# Patient Record
Sex: Female | Born: 1960
Health system: Southern US, Community
[De-identification: ages and names within clinical notes are randomized; demographics above are authoritative.]

## PROBLEM LIST (undated history)

## (undated) DIAGNOSIS — M47812 Spondylosis without myelopathy or radiculopathy, cervical region: Secondary | ICD-10-CM

## (undated) DIAGNOSIS — IMO0002 Reserved for concepts with insufficient information to code with codable children: Secondary | ICD-10-CM

## (undated) DIAGNOSIS — I10 Essential (primary) hypertension: Secondary | ICD-10-CM

## (undated) DIAGNOSIS — M5126 Other intervertebral disc displacement, lumbar region: Secondary | ICD-10-CM

## (undated) DIAGNOSIS — R87619 Unspecified abnormal cytological findings in specimens from cervix uteri: Secondary | ICD-10-CM

## (undated) DIAGNOSIS — E785 Hyperlipidemia, unspecified: Secondary | ICD-10-CM

## (undated) DIAGNOSIS — M47816 Spondylosis without myelopathy or radiculopathy, lumbar region: Secondary | ICD-10-CM

## (undated) DIAGNOSIS — C801 Malignant (primary) neoplasm, unspecified: Secondary | ICD-10-CM

## (undated) HISTORY — PX: REDUCTION MAMMAPLASTY: SUR839

## (undated) HISTORY — DX: Reserved for concepts with insufficient information to code with codable children: IMO0002

## (undated) HISTORY — DX: Hyperlipidemia, unspecified: E78.5

## (undated) HISTORY — DX: Malignant (primary) neoplasm, unspecified: C80.1

## (undated) HISTORY — DX: Unspecified abnormal cytological findings in specimens from cervix uteri: R87.619

## (undated) HISTORY — DX: Spondylosis without myelopathy or radiculopathy, lumbar region: M47.816

## (undated) HISTORY — PX: REPLACEMENT TOTAL KNEE: SUR1224

## (undated) HISTORY — DX: Other intervertebral disc displacement, lumbar region: M51.26

## (undated) HISTORY — DX: Essential (primary) hypertension: I10

## (undated) HISTORY — PX: OTHER SURGICAL HISTORY: SHX169

## (undated) HISTORY — DX: Spondylosis without myelopathy or radiculopathy, cervical region: M47.812

---

## 2005-02-20 ENCOUNTER — Ambulatory Visit: Payer: Self-pay | Admitting: Family Medicine

## 2005-02-20 ENCOUNTER — Other Ambulatory Visit: Admission: RE | Admit: 2005-02-20 | Discharge: 2005-02-20 | Payer: Self-pay | Admitting: Family Medicine

## 2005-02-20 LAB — CONVERTED CEMR LAB: Pap Smear: NORMAL

## 2005-02-25 ENCOUNTER — Encounter: Payer: Self-pay | Admitting: Family Medicine

## 2005-02-25 LAB — CONVERTED CEMR LAB
ALT: 20 units/L
AST: 53 units/L
Creatinine, Ser: 0.6 mg/dL

## 2005-02-28 ENCOUNTER — Ambulatory Visit: Payer: Self-pay | Admitting: Family Medicine

## 2005-02-28 LAB — CONVERTED CEMR LAB
Microalb Creat Ratio: <30 mg/g
Microalb, Ur: NORMAL mg/dL
Microalbumin U total vol: NORMAL mg/L

## 2005-04-10 ENCOUNTER — Ambulatory Visit: Payer: Self-pay | Admitting: Family Medicine

## 2005-04-24 ENCOUNTER — Ambulatory Visit: Payer: Self-pay | Admitting: Family Medicine

## 2005-07-10 ENCOUNTER — Ambulatory Visit: Payer: Self-pay | Admitting: Family Medicine

## 2005-07-10 LAB — CONVERTED CEMR LAB: Hgb A1c MFr Bld: 6.4 %

## 2005-10-21 DIAGNOSIS — E669 Obesity, unspecified: Secondary | ICD-10-CM | POA: Insufficient documentation

## 2005-10-21 DIAGNOSIS — E785 Hyperlipidemia, unspecified: Secondary | ICD-10-CM | POA: Insufficient documentation

## 2005-10-21 DIAGNOSIS — I10 Essential (primary) hypertension: Secondary | ICD-10-CM | POA: Insufficient documentation

## 2005-10-21 DIAGNOSIS — E119 Type 2 diabetes mellitus without complications: Secondary | ICD-10-CM | POA: Insufficient documentation

## 2005-10-21 DIAGNOSIS — N920 Excessive and frequent menstruation with regular cycle: Secondary | ICD-10-CM | POA: Insufficient documentation

## 2005-11-12 ENCOUNTER — Encounter: Payer: Self-pay | Admitting: Family Medicine

## 2005-11-13 ENCOUNTER — Ambulatory Visit: Payer: Self-pay | Admitting: Family Medicine

## 2005-11-13 LAB — CONVERTED CEMR LAB: Hgb A1c MFr Bld: 6.2 %

## 2006-03-30 ENCOUNTER — Ambulatory Visit: Payer: Self-pay | Admitting: Family Medicine

## 2006-03-30 DIAGNOSIS — M479 Spondylosis, unspecified: Secondary | ICD-10-CM | POA: Insufficient documentation

## 2006-04-06 ENCOUNTER — Encounter: Payer: Self-pay | Admitting: Family Medicine

## 2006-04-06 LAB — CONVERTED CEMR LAB
ALT: 9 units/L (ref 0–35)
AST: 14 units/L (ref 0–37)
Albumin: 4.1 g/dL (ref 3.5–5.2)
Alkaline Phosphatase: 60 units/L (ref 39–117)
BUN: 14 mg/dL (ref 6–23)
CO2: 24 meq/L (ref 19–32)
Calcium: 9.1 mg/dL (ref 8.4–10.5)
Chloride: 102 meq/L (ref 96–112)
Cholesterol: 140 mg/dL (ref 0–200)
Creatinine, Ser: 0.71 mg/dL (ref 0.40–1.20)
Glucose, Bld: 131 mg/dL — ABNORMAL HIGH (ref 70–99)
HDL: 58 mg/dL (ref >39)
LDL Cholesterol: 72 mg/dL (ref 0–99)
Potassium: 4.5 meq/L (ref 3.5–5.3)
Sodium: 137 meq/L (ref 135–145)
Total Bilirubin: 0.6 mg/dL (ref 0.3–1.2)
Total CHOL/HDL Ratio: 2.4
Total Protein: 7.5 g/dL (ref 6.0–8.3)
Triglycerides: 49 mg/dL (ref <150)
VLDL: 10 mg/dL (ref 0–40)

## 2006-04-07 ENCOUNTER — Encounter: Payer: Self-pay | Admitting: Family Medicine

## 2006-07-06 ENCOUNTER — Ambulatory Visit: Payer: Self-pay | Admitting: Family Medicine

## 2006-07-06 LAB — CONVERTED CEMR LAB: Hgb A1c MFr Bld: 6.9 %

## 2006-11-02 ENCOUNTER — Ambulatory Visit: Payer: Self-pay | Admitting: Family Medicine

## 2006-11-02 LAB — CONVERTED CEMR LAB: Rapid Strep: NEGATIVE

## 2006-11-24 ENCOUNTER — Ambulatory Visit: Payer: Self-pay | Admitting: Family Medicine

## 2006-11-24 ENCOUNTER — Other Ambulatory Visit: Admission: RE | Admit: 2006-11-24 | Discharge: 2006-11-24 | Payer: Self-pay | Admitting: Family Medicine

## 2006-11-24 ENCOUNTER — Encounter: Payer: Self-pay | Admitting: Family Medicine

## 2006-11-24 LAB — CONVERTED CEMR LAB: Hgb A1c MFr Bld: 6.6 %

## 2006-11-26 ENCOUNTER — Telehealth (INDEPENDENT_AMBULATORY_CARE_PROVIDER_SITE_OTHER): Payer: Self-pay | Admitting: *Deleted

## 2006-11-27 LAB — CONVERTED CEMR LAB: Pap Smear: NORMAL

## 2007-01-13 ENCOUNTER — Encounter: Payer: Self-pay | Admitting: Family Medicine

## 2007-03-23 ENCOUNTER — Encounter: Payer: Self-pay | Admitting: Family Medicine

## 2007-03-24 ENCOUNTER — Ambulatory Visit: Payer: Self-pay | Admitting: Family Medicine

## 2007-03-24 LAB — CONVERTED CEMR LAB
Bilirubin Urine: NEGATIVE
Blood in Urine, dipstick: NEGATIVE
Glucose, Urine, Semiquant: NEGATIVE
Hgb A1c MFr Bld: 7 %
Ketones, urine, test strip: NEGATIVE
Nitrite: NEGATIVE
Protein, U semiquant: NEGATIVE
Specific Gravity, Urine: 1.02
Urobilinogen, UA: NEGATIVE
WBC Urine, dipstick: NEGATIVE
pH: 7

## 2007-06-01 ENCOUNTER — Encounter: Payer: Self-pay | Admitting: Family Medicine

## 2007-06-01 LAB — CONVERTED CEMR LAB
ALT: 10 units/L (ref 0–35)
AST: 18 units/L (ref 0–37)
Albumin: 3.9 g/dL (ref 3.5–5.2)
Alkaline Phosphatase: 55 units/L (ref 39–117)
BUN: 12 mg/dL (ref 6–23)
CO2: 25 meq/L (ref 19–32)
Calcium: 8.9 mg/dL (ref 8.4–10.5)
Chloride: 103 meq/L (ref 96–112)
Cholesterol: 131 mg/dL (ref 0–200)
Creatinine, Ser: 0.63 mg/dL (ref 0.40–1.20)
Glucose, Bld: 109 mg/dL — ABNORMAL HIGH (ref 70–99)
HDL: 57 mg/dL (ref >39)
LDL Cholesterol: 63 mg/dL (ref 0–99)
Potassium: 4.6 meq/L (ref 3.5–5.3)
Sodium: 139 meq/L (ref 135–145)
Total Bilirubin: 0.5 mg/dL (ref 0.3–1.2)
Total CHOL/HDL Ratio: 2.3
Total Protein: 7.1 g/dL (ref 6.0–8.3)
Triglycerides: 55 mg/dL (ref <150)
VLDL: 11 mg/dL (ref 0–40)

## 2007-06-02 ENCOUNTER — Encounter: Payer: Self-pay | Admitting: Family Medicine

## 2007-08-10 ENCOUNTER — Ambulatory Visit: Payer: Self-pay | Admitting: Family Medicine

## 2007-08-10 DIAGNOSIS — G5603 Carpal tunnel syndrome, bilateral upper limbs: Secondary | ICD-10-CM | POA: Insufficient documentation

## 2007-08-10 DIAGNOSIS — G5600 Carpal tunnel syndrome, unspecified upper limb: Secondary | ICD-10-CM

## 2007-08-10 LAB — CONVERTED CEMR LAB: Hgb A1c MFr Bld: 6.4 %

## 2007-10-27 ENCOUNTER — Telehealth (INDEPENDENT_AMBULATORY_CARE_PROVIDER_SITE_OTHER): Payer: Self-pay | Admitting: *Deleted

## 2007-10-28 ENCOUNTER — Ambulatory Visit: Payer: Self-pay | Admitting: Family Medicine

## 2007-12-15 ENCOUNTER — Ambulatory Visit: Payer: Self-pay | Admitting: Family Medicine

## 2007-12-15 DIAGNOSIS — M7541 Impingement syndrome of right shoulder: Secondary | ICD-10-CM | POA: Insufficient documentation

## 2007-12-15 DIAGNOSIS — M25519 Pain in unspecified shoulder: Secondary | ICD-10-CM | POA: Insufficient documentation

## 2007-12-15 DIAGNOSIS — M25511 Pain in right shoulder: Secondary | ICD-10-CM | POA: Insufficient documentation

## 2007-12-15 LAB — CONVERTED CEMR LAB: Hgb A1c MFr Bld: 6.4 %

## 2007-12-17 ENCOUNTER — Telehealth (INDEPENDENT_AMBULATORY_CARE_PROVIDER_SITE_OTHER): Payer: Self-pay | Admitting: *Deleted

## 2007-12-20 DIAGNOSIS — M79609 Pain in unspecified limb: Secondary | ICD-10-CM | POA: Insufficient documentation

## 2007-12-28 ENCOUNTER — Encounter: Payer: Self-pay | Admitting: Family Medicine

## 2007-12-30 ENCOUNTER — Encounter: Payer: Self-pay | Admitting: Family Medicine

## 2008-03-10 ENCOUNTER — Encounter: Payer: Self-pay | Admitting: Family Medicine

## 2008-03-15 ENCOUNTER — Encounter: Payer: Self-pay | Admitting: Family Medicine

## 2008-04-07 LAB — HM DIABETES EYE EXAM

## 2008-04-13 ENCOUNTER — Ambulatory Visit: Payer: Self-pay | Admitting: Family Medicine

## 2008-04-13 LAB — CONVERTED CEMR LAB: Hgb A1c MFr Bld: 6.5 %

## 2008-04-17 ENCOUNTER — Encounter: Payer: Self-pay | Admitting: Family Medicine

## 2008-09-13 ENCOUNTER — Ambulatory Visit: Payer: Self-pay | Admitting: Family Medicine

## 2008-09-13 LAB — CONVERTED CEMR LAB
Albumin/Creatinine Ratio, Urine, POC: <30
Creatinine,U: 50 mg/dL
Hgb A1c MFr Bld: 7 %
Microalbumin U total vol: 10 mg/L

## 2008-09-13 LAB — HM DIABETES FOOT EXAM: HM Diabetic Foot Exam: NORMAL

## 2008-09-14 ENCOUNTER — Encounter: Payer: Self-pay | Admitting: Family Medicine

## 2008-09-15 LAB — CONVERTED CEMR LAB
ALT: 10 units/L (ref 0–35)
AST: 14 units/L (ref 0–37)
Albumin: 3.6 g/dL (ref 3.5–5.2)
Alkaline Phosphatase: 55 units/L (ref 39–117)
BUN: 15 mg/dL (ref 6–23)
CO2: 24 meq/L (ref 19–32)
Calcium: 8.7 mg/dL (ref 8.4–10.5)
Chloride: 105 meq/L (ref 96–112)
Cholesterol: 151 mg/dL (ref 0–200)
Creatinine, Ser: 0.67 mg/dL (ref 0.40–1.20)
Glucose, Bld: 112 mg/dL — ABNORMAL HIGH (ref 70–99)
HDL: 55 mg/dL (ref >39)
LDL Cholesterol: 84 mg/dL (ref 0–99)
Potassium: 4.3 meq/L (ref 3.5–5.3)
Sodium: 139 meq/L (ref 135–145)
TSH: 4.26 microintl units/mL (ref 0.350–4.500)
Total Bilirubin: 0.5 mg/dL (ref 0.3–1.2)
Total CHOL/HDL Ratio: 2.7
Total Protein: 6.8 g/dL (ref 6.0–8.3)
Triglycerides: 62 mg/dL (ref <150)
VLDL: 12 mg/dL (ref 0–40)

## 2008-09-19 ENCOUNTER — Ambulatory Visit: Payer: Self-pay | Admitting: Family Medicine

## 2008-09-19 ENCOUNTER — Ambulatory Visit: Payer: Self-pay | Admitting: Diagnostic Radiology

## 2008-09-19 ENCOUNTER — Telehealth (INDEPENDENT_AMBULATORY_CARE_PROVIDER_SITE_OTHER): Payer: Self-pay | Admitting: *Deleted

## 2008-09-19 ENCOUNTER — Ambulatory Visit (HOSPITAL_BASED_OUTPATIENT_CLINIC_OR_DEPARTMENT_OTHER): Admission: RE | Admit: 2008-09-19 | Discharge: 2008-09-19 | Payer: Self-pay | Admitting: Family Medicine

## 2008-09-19 DIAGNOSIS — I951 Orthostatic hypotension: Secondary | ICD-10-CM | POA: Insufficient documentation

## 2008-09-19 LAB — CONVERTED CEMR LAB
Bilirubin Urine: NEGATIVE
Glucose, Urine, Semiquant: NEGATIVE
Ketones, urine, test strip: NEGATIVE
Nitrite: NEGATIVE
Specific Gravity, Urine: 1.01
Urobilinogen, UA: 0.2
WBC Urine, dipstick: NEGATIVE
pH: 7

## 2008-09-20 ENCOUNTER — Telehealth: Payer: Self-pay | Admitting: Family Medicine

## 2008-09-21 ENCOUNTER — Encounter: Payer: Self-pay | Admitting: Family Medicine

## 2008-09-21 DIAGNOSIS — D649 Anemia, unspecified: Secondary | ICD-10-CM | POA: Insufficient documentation

## 2008-09-21 LAB — CONVERTED CEMR LAB
ALT: 9 units/L (ref 0–35)
AST: 14 units/L (ref 0–37)
Albumin: 3.5 g/dL (ref 3.5–5.2)
Alkaline Phosphatase: 49 units/L (ref 39–117)
BUN: 18 mg/dL (ref 6–23)
Basophils Absolute: 0 10*3/uL (ref 0.0–0.1)
Basophils Relative: 1 % (ref 0–1)
CO2: 24 meq/L (ref 19–32)
Calcium: 8.3 mg/dL — ABNORMAL LOW (ref 8.4–10.5)
Chloride: 105 meq/L (ref 96–112)
Creatinine, Ser: 0.58 mg/dL (ref 0.40–1.20)
Eosinophils Absolute: 0.3 10*3/uL (ref 0.0–0.7)
Eosinophils Relative: 5 % (ref 0–5)
Glucose, Bld: 120 mg/dL — ABNORMAL HIGH (ref 70–99)
HCT: 32.8 % — ABNORMAL LOW (ref 36.0–46.0)
Hemoglobin: 10.1 g/dL — ABNORMAL LOW (ref 12.0–15.0)
Lymphocytes Relative: 37 % (ref 12–46)
Lymphs Abs: 2 10*3/uL (ref 0.7–4.0)
MCHC: 30.8 g/dL (ref 30.0–36.0)
MCV: 69.3 fL — ABNORMAL LOW (ref 78.0–100.0)
Monocytes Absolute: 0.5 10*3/uL (ref 0.1–1.0)
Monocytes Relative: 10 % (ref 3–12)
Neutro Abs: 2.6 10*3/uL (ref 1.7–7.7)
Neutrophils Relative %: 48 % (ref 43–77)
Platelets: 299 10*3/uL (ref 150–400)
Potassium: 4.5 meq/L (ref 3.5–5.3)
RBC: 4.73 M/uL (ref 3.87–5.11)
RDW: 15.8 % — ABNORMAL HIGH (ref 11.5–15.5)
Sodium: 141 meq/L (ref 135–145)
TSH: 4.013 microintl units/mL (ref 0.350–4.500)
Total Bilirubin: 0.4 mg/dL (ref 0.3–1.2)
Total Protein: 6.4 g/dL (ref 6.0–8.3)
WBC: 5.5 10*3/uL (ref 4.0–10.5)

## 2008-09-22 ENCOUNTER — Encounter: Admission: RE | Admit: 2008-09-22 | Discharge: 2008-09-22 | Payer: Self-pay | Admitting: Family Medicine

## 2008-09-22 DIAGNOSIS — R519 Headache, unspecified: Secondary | ICD-10-CM | POA: Insufficient documentation

## 2008-09-22 DIAGNOSIS — R51 Headache: Secondary | ICD-10-CM | POA: Insufficient documentation

## 2008-09-25 ENCOUNTER — Encounter: Admission: RE | Admit: 2008-09-25 | Discharge: 2008-09-25 | Payer: Self-pay | Admitting: Family Medicine

## 2008-09-28 ENCOUNTER — Telehealth: Payer: Self-pay | Admitting: Family Medicine

## 2008-10-07 ENCOUNTER — Encounter: Payer: Self-pay | Admitting: Family Medicine

## 2008-10-09 ENCOUNTER — Ambulatory Visit: Payer: Self-pay | Admitting: Family Medicine

## 2008-10-13 ENCOUNTER — Encounter: Payer: Self-pay | Admitting: Family Medicine

## 2008-10-16 ENCOUNTER — Encounter: Admission: RE | Admit: 2008-10-16 | Discharge: 2008-10-16 | Payer: Self-pay | Admitting: Specialist

## 2008-10-24 ENCOUNTER — Encounter: Payer: Self-pay | Admitting: Family Medicine

## 2008-10-25 ENCOUNTER — Encounter: Payer: Self-pay | Admitting: Family Medicine

## 2008-10-25 DIAGNOSIS — Z8679 Personal history of other diseases of the circulatory system: Secondary | ICD-10-CM | POA: Insufficient documentation

## 2008-10-26 ENCOUNTER — Encounter: Payer: Self-pay | Admitting: Family Medicine

## 2008-10-27 ENCOUNTER — Telehealth: Payer: Self-pay | Admitting: Family Medicine

## 2008-11-01 ENCOUNTER — Telehealth (INDEPENDENT_AMBULATORY_CARE_PROVIDER_SITE_OTHER): Payer: Self-pay | Admitting: *Deleted

## 2008-11-03 ENCOUNTER — Encounter: Admission: RE | Admit: 2008-11-03 | Discharge: 2008-11-03 | Payer: Self-pay | Admitting: Family Medicine

## 2008-11-09 ENCOUNTER — Encounter: Payer: Self-pay | Admitting: Family Medicine

## 2008-11-13 ENCOUNTER — Telehealth (INDEPENDENT_AMBULATORY_CARE_PROVIDER_SITE_OTHER): Payer: Self-pay | Admitting: *Deleted

## 2008-11-28 ENCOUNTER — Ambulatory Visit: Payer: Self-pay | Admitting: Family Medicine

## 2008-12-05 ENCOUNTER — Encounter: Payer: Self-pay | Admitting: Family Medicine

## 2008-12-14 ENCOUNTER — Encounter: Payer: Self-pay | Admitting: Family Medicine

## 2008-12-15 DIAGNOSIS — C73 Malignant neoplasm of thyroid gland: Secondary | ICD-10-CM | POA: Insufficient documentation

## 2008-12-21 ENCOUNTER — Encounter: Payer: Self-pay | Admitting: Family Medicine

## 2009-01-03 ENCOUNTER — Ambulatory Visit: Payer: Self-pay | Admitting: Family Medicine

## 2009-01-03 LAB — CONVERTED CEMR LAB: Rapid Strep: POSITIVE

## 2009-02-06 ENCOUNTER — Encounter: Payer: Self-pay | Admitting: Family Medicine

## 2009-02-13 DIAGNOSIS — C801 Malignant (primary) neoplasm, unspecified: Secondary | ICD-10-CM

## 2009-02-13 HISTORY — DX: Malignant (primary) neoplasm, unspecified: C80.1

## 2009-02-13 HISTORY — PX: TOTAL THYROIDECTOMY: SHX2547

## 2009-04-03 ENCOUNTER — Encounter: Payer: Self-pay | Admitting: Family Medicine

## 2009-04-27 ENCOUNTER — Ambulatory Visit: Payer: Self-pay | Admitting: Family Medicine

## 2009-04-27 LAB — CONVERTED CEMR LAB: Hgb A1c MFr Bld: 7.2 %

## 2009-10-04 ENCOUNTER — Encounter: Payer: Self-pay | Admitting: Family Medicine

## 2009-11-13 ENCOUNTER — Encounter: Payer: Self-pay | Admitting: Family Medicine

## 2010-01-13 HISTORY — PX: OTHER SURGICAL HISTORY: SHX169

## 2010-01-17 ENCOUNTER — Encounter: Payer: Self-pay | Admitting: Family Medicine

## 2010-01-22 ENCOUNTER — Other Ambulatory Visit
Admission: RE | Admit: 2010-01-22 | Discharge: 2010-01-22 | Payer: Self-pay | Source: Home / Self Care | Admitting: Family Medicine

## 2010-01-22 ENCOUNTER — Ambulatory Visit
Admission: RE | Admit: 2010-01-22 | Discharge: 2010-01-22 | Payer: Self-pay | Source: Home / Self Care | Attending: Family Medicine | Admitting: Family Medicine

## 2010-01-22 LAB — CONVERTED CEMR LAB: Pap Smear: NORMAL

## 2010-01-22 LAB — HM PAP SMEAR: HM Pap smear: NORMAL

## 2010-02-12 ENCOUNTER — Encounter
Admission: RE | Admit: 2010-02-12 | Discharge: 2010-02-12 | Payer: Self-pay | Source: Home / Self Care | Attending: Family Medicine | Admitting: Family Medicine

## 2010-02-12 LAB — HM MAMMOGRAPHY: HM Mammogram: NORMAL

## 2010-02-12 NOTE — Assessment & Plan Note (Signed)
Summary: f/u diabetes   Vital Signs:  Patient profile:   50 year old female Height:      65 inches Weight:      259 pounds Pulse rate:   85 / minute BP sitting:   114 / 72  (left arm) Cuff size:   large  Vitals Entered By: Harlene Salts (April 13, 2008 8:29 AM)  History of Present Illness: 50 yo F presents for f/u.  1.  R shoulder s/p injection with Dr Riley Lam improved.    2.  R heel pain syndrome.  She did have steroid injection and ultrasound therapy.  She has still not been able to exercise.  Wearing TED hose.  Wearing comfortable sneakers with orthotics.  3.  Wrist splints for carpal tunnel is helping.  On as needed Meloxicam.    Habits & Providers     Type of exercise: N/A  Current Medications (verified): 1)  Aspirin 81 Mg Tbec (Aspirin) .... Take 1 Tablet By Mouth Once A Day 2)  Avandamet 2-500 Mg Tabs (Rosiglitazone-Metformin) .... One By Mouth Twice Daily 3)  Lisinopril 20 Mg Tabs (Lisinopril) .... Take 1 Tablet By Mouth Once A Day 4)  Lovastatin 20 Mg Tabs (Lovastatin) .... Take 1 Tablet By Mouth Once A Day 5)  Free Style Test Strips .... Use Daily As Directed  Allergies: 1)  ! * Oral Contraceptive Pills  Review of Systems      See HPI  Physical Exam  General:  alert, well-developed, well-nourished, and well-hydrated.  obese Eyes:  pupils equal, pupils round, and pupils reactive to light.   Mouth:  good dentition and pharynx pink and moist.   Neck:  no masses.   Lungs:  Normal respiratory effort, chest expands symmetrically. Lungs are clear to auscultation, no crackles or wheezes. Heart:  Normal rate and regular rhythm. S1 and S2 normal without gallop, murmur, click, rub or other extra sounds. Msk:  full ROM R shoulder Extremities:  no UE or LE edema. R foot is wrapped with a tight gauze dressing and wearing bilat compression hose  Skin:  color normal.   Cervical Nodes:  No lymphadenopathy noted Psych:  good eye contact, not anxious appearing, and not  depressed appearing.    Diabetes Management Exam:    Eye Exam:       Eye Exam done elsewhere   Impression & Recommendations:  Problem # 1:  DIABETES MELLITUS II, UNCOMPLICATED (ICD-250.00) A1C good at 6.5, up from 6.2.  No change in weight.  Compliant with diet and exercise but needs to work on wt loss.  Eye exam and urine micro are UTD.  Continue current meds, RF'd today. Her updated medication list for this problem includes:    Aspirin 81 Mg Tbec (Aspirin) .Marland Kitchen... Take 1 tablet by mouth once a day    Avandamet 2-500 Mg Tabs (Rosiglitazone-metformin) ..... One by mouth twice daily    Lisinopril 20 Mg Tabs (Lisinopril) .Marland Kitchen... Take 1 tablet by mouth once a day  Orders: Fingerstick (04540) Hemoglobin A1C (83036)  Labs Reviewed: Creat: 0.63 (06/01/2007)   Microalbumin: 30mg /L (08/10/2007) Reviewed HgBA1c results: 6.5 (04/13/2008)  6.4 (12/15/2007)  Problem # 2:  HYPERTENSION, BENIGN SYSTEMIC (ICD-401.1) At goal.  Continue Lisinopril. Her updated medication list for this problem includes:    Lisinopril 20 Mg Tabs (Lisinopril) .Marland Kitchen... Take 1 tablet by mouth once a day  BP today: 114/72 Prior BP: 106/72 (12/15/2007)  Labs Reviewed: K+: 4.6 (06/01/2007) Creat: : 0.63 (06/01/2007)   Chol:  131 (06/01/2007)   HDL: 57 (06/01/2007)   LDL: 63 (06/01/2007)   TG: 55 (06/01/2007)  Problem # 3:  HYPERLIPIDEMIA (ICD-272.4) Due for labs end of May.  RF'd Lovastatin. Her updated medication list for this problem includes:    Lovastatin 20 Mg Tabs (Lovastatin) .Marland Kitchen... Take 1 tablet by mouth once a day  Labs Reviewed: SGOT: 18 (06/01/2007)   SGPT: 10 (06/01/2007)   HDL:57 (06/01/2007), 58 (04/06/2006)  LDL:63 (06/01/2007), 72 (04/06/2006)  Chol:131 (06/01/2007), 140 (04/06/2006)  Trig:55 (06/01/2007), 49 (04/06/2006)  Problem # 4:  FOOT PAIN (ICD-729.5) Still having pain.  Slow to improve.  Reviewed notes from Dr Yates Decamp.  As pain improves, she needs to start exercising more.  Cont with ultrasound  therapy.  Problem # 5:  SHOULDER PAIN, RIGHT (ICD-719.41) Seeing Dr Riley Lam.  Improved after corticosteroid injection. Her updated medication list for this problem includes:    Aspirin 81 Mg Tbec (Aspirin) .Marland Kitchen... Take 1 tablet by mouth once a day  Problem # 6:  CARPAL TUNNEL SYNDROME, BILATERAL (ICD-354.0) Assessment: Improved With night splints.  Following w/ Dr Riley Lam.  Using Mobic as needed.  Complete Medication List: 1)  Aspirin 81 Mg Tbec (Aspirin) .... Take 1 tablet by mouth once a day 2)  Avandamet 2-500 Mg Tabs (Rosiglitazone-metformin) .... One by mouth twice daily 3)  Lisinopril 20 Mg Tabs (Lisinopril) .... Take 1 tablet by mouth once a day 4)  Lovastatin 20 Mg Tabs (Lovastatin) .... Take 1 tablet by mouth once a day 5)  Free Style Test Strips  .... Use daily as directed 6)  1st Choice Lancets Thin Misc (Lancets) .... Use twice daily as directed  Patient Instructions: 1)  Stick with diabetic diet and work on exercise as heel pain improves. 2)  Stay on current meds. 3)  Call for lab order, fasting.  Due after May 19th. 4)  Return for f/u in 4 months. Prescriptions: 1ST CHOICE LANCETS THIN  MISC (LANCETS) use twice daily as directed  #180 x 2   Entered and Authorized by:   Seymour Bars DO   Signed by:   Seymour Bars DO on 04/13/2008   Method used:   Print then Give to Patient   RxID:   [id number] LOVASTATIN 20 MG TABS (LOVASTATIN) Take 1 tablet by mouth once a day  #90 x 2   Entered and Authorized by:   Seymour Bars DO   Signed by:   Seymour Bars DO on 04/13/2008   Method used:   Print then Give to Patient   RxID:   [id number] LISINOPRIL 20 MG TABS (LISINOPRIL) Take 1 tablet by mouth once a day  #90 x 2   Entered and Authorized by:   Seymour Bars DO   Signed by:   Seymour Bars DO on 04/13/2008   Method used:   Print then Give to Patient   RxID:   [id number] AVANDAMET 2-500 MG TABS (ROSIGLITAZONE-METFORMIN) one by mouth twice daily  #180 x 2   Entered and  Authorized by:   Seymour Bars DO   Signed by:   Seymour Bars DO on 04/13/2008   Method used:   Print then Give to Patient   RxID:   [id number]   Laboratory Results   Blood Tests   Date/Time Recieved: April 13, 2008 8:43 AM  Date/Time Reported: April 13, 2008 8:43 AM   HGBA1C: 6.5%   (Normal Range: Non-Diabetic - 3-6%   Control Diabetic - 6-8%)  Appended Document: f/u diabetes rxs faxed to [facility] and because patient not registered with them yet they aren't able to submit. patient informed to let us know when she takes care of this and we can refax.LM

## 2010-02-12 NOTE — Letter (Signed)
Summary: [facility]  [facility]   Imported By: Lanelle Bal 11/28/2008 10:33:05  _____________________________________________________________________  External Attachment:    Type:   Image     Comment:   External Document

## 2010-02-12 NOTE — Letter (Signed)
Summary: Out of Work  [facility]  [address]   [place], [place] [address]   Phone: [phone]  Fax: [phone]    September 21, 2008   Employee:  [employer]    To Whom It May Concern:   For Medical reasons, please excuse the above named employee from work for the following dates:  Start:   Sept 9, 10, 11,12  End:    Sept 13  If you need additional information, please feel free to contact our office.         Sincerely,    Seymour Bars DO

## 2010-02-12 NOTE — Progress Notes (Signed)
Summary: sleep study normal  Phone Note Outgoing Call   Summary of Call: Marcelino Duster, Pls call pt and let her know that her sleep study is NEGATIVE for sleep apnea.  She had some REM related snoring and trouble maintaining sleep.  It was recommended to work on weight loss for morbid obesity.   Send copy to neurology.   Initial call taken by: Seymour Bars DO,  November 13, 2008 1:28 PM  Follow-up for Phone Call        LM w/ daughter for Pt to CB.  Follow-up by: Payton Spark CMA,  November 13, 2008 3:21 PM  Additional Follow-up for Phone Call Additional follow up Details #1::        Pt aware Additional Follow-up by: Payton Spark CMA,  November 14, 2008 10:45 AM

## 2010-02-12 NOTE — Assessment & Plan Note (Signed)
Summary: f/u migraines/ vertigo   Vital Signs:  Patient profile:   50 year old female Height:      65 inches Weight:      252 pounds BMI:     42.09 O2 Sat:      100 % on Room air Temp:     98.8 degrees F oral Pulse rate:   82 / minute BP sitting:   104 / 64  (left arm) Cuff size:   large  Vitals Entered By: Payton Spark CMA (November 28, 2008 3:25 PM)  O2 Flow:  Room air CC: FU   Primary Care Provider:  Seymour Bars D.O.  CC:  FU.  History of Present Illness: 50 yo F presents for f/u migraines, vertigo, BP and DM.  She is seeing Dr Loleta Chance.  Did not tolerate Topamax-- made her depressed some had to change it to Lamictal.  She is doing well with the Lamictal, titrating up her dose.  She is still having several HAs per wk, has flexeril, tramadol, maxalt for rescue.  No longer having vertigo.  Not needing to take any antivert.  Her sugars are running in the 100s fasting.    Current Medications (verified): 1)  Aspirin 81 Mg Tbec (Aspirin) .... Take 1 Tablet By Mouth Once A Day 2)  Metformin Hcl 850 Mg Tabs (Metformin Hcl) .Marland Kitchen.. 1 Tab By Mouth By Mouth Bid 3)  Lisinopril 20 Mg Tabs (Lisinopril) .... 1/2 Tab By Mouth Bid 4)  Lovastatin 20 Mg Tabs (Lovastatin) .... Take 1 Tablet By Mouth Once A Day 5)  Free Style Test Strips .... Use Daily As Directed 6)  1st Choice Lancets Thin  Misc (Lancets) .... Use Twice Daily As Directed 7)  Antivert 25 Mg Tabs (Meclizine Hcl) .Marland Kitchen.. 1-2 Tabs By Mouth Two Times A Day As Needed Dizziness 8)  Maxalt 10 Mg Tabs (Rizatriptan Benzoate) .... Take As Needed For Migraines 9)  Lamictal 200 Mg Tabs (Lamotrigine) .... Take As Directed  Allergies (verified): 1)  ! * Oral Contraceptive Pills  Past History:  Past Medical History: cervical, thoracic, lumbar spondylosis  [id number] Insulin dependent diabetes during pregnancy  lumbar disc herniation  seborrheic dermatitis  Thalasemia minor; Hgb 11.6 2-07 DM Type II  11-08 last pap Dr Loleta Chance for  migraines/ vertigo  Social History: Reviewed history from 10/21/2005 and no changes required. 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  Married to husband Patrcia Dolly and has 2 kids 16 and 4.  Moved from [place] in 2006.  Wants to lose wt.  Not exercising.  Review of Systems      See HPI  Physical Exam  General:  alert, well-developed, well-nourished, and well-hydrated.  obesealert, well-developed, well-nourished, and well-hydrated.   Head:  normocephalic and atraumatic.  normocephalic and atraumatic.   Eyes:  pupils equal, pupils round, and pupils reactive to light.  pupils equal, pupils round, and pupils reactive to light.   Nose:  no nasal discharge.  no nasal discharge.   Mouth:  pharynx pink and moist.  pharynx pink and moist.   Neck:  mulinodular goiter, small Lungs:  Normal respiratory effort, chest expands symmetrically. Lungs are clear to auscultation, no crackles or wheezes. Heart:  Normal rate and regular rhythm. S1 and S2 normal without gallop, murmur, click, rub or other extra sounds. Skin:  color normal.  color normal.   Psych:  good eye contact, not anxious appearing, and not depressed appearing.  good eye contact, not anxious appearing, and not depressed appearing.  Impression & Recommendations:  Problem # 1:  DIABETES MELLITUS II, UNCOMPLICATED (ICD-250.00) Not due for A1C.  UTD.  We reviewed her sugar readings and plans to work on diet, exercise and wt loss.   Her updated medication list for this problem includes:    Aspirin 81 Mg Tbec (Aspirin) .Marland Kitchen... Take 1 tablet by mouth once a day    Metformin Hcl 850 Mg Tabs (Metformin hcl) .Marland Kitchen... 1 tab by mouth by mouth bid    Lisinopril 20 Mg Tabs (Lisinopril) .Marland Kitchen... 1/2 tab by mouth bid  Her updated medication list for this problem includes:    Aspirin 81 Mg Tbec (Aspirin) .Marland Kitchen... Take 1 tablet by mouth once a day    Metformin Hcl 850 Mg Tabs (Metformin hcl) .Marland Kitchen... 1 tab by mouth by mouth bid    Lisinopril 20 Mg Tabs (Lisinopril) .Marland Kitchen...  1/2 tab by mouth bid  Labs Reviewed: Creat: 0.58 (09/19/2008)   Microalbumin: 10 (09/13/2008)  Last Eye Exam: no retinopathy, Dr Sherrie George (04/07/2008) Reviewed HgBA1c results: 7.0 (09/13/2008)  6.5 (04/13/2008)  Problem # 2:  CEPHALGIA (ICD-784.0) Seeing Dr Loleta Chance for migraines/ vertigo.  Slowly improving with new start Lamictal.   Her updated medication list for this problem includes:    Aspirin 81 Mg Tbec (Aspirin) .Marland Kitchen... Take 1 tablet by mouth once a day    Maxalt 10 Mg Tabs (Rizatriptan benzoate) .Marland Kitchen... Take as needed for migraines    Tramadol Hcl 50 Mg Tabs (Tramadol hcl) .Marland Kitchen... 1 tab by mouth bid  Her updated medication list for this problem includes:    Aspirin 81 Mg Tbec (Aspirin) .Marland Kitchen... Take 1 tablet by mouth once a day    Maxalt 10 Mg Tabs (Rizatriptan benzoate) .Marland Kitchen... Take as needed for migraines    Tramadol Hcl 50 Mg Tabs (Tramadol hcl) .Marland Kitchen... 1 tab by mouth bid  Problem # 3:  HYPERTENSION, BENIGN SYSTEMIC (ICD-401.1) BP much improved.  No longer having lows. Her updated medication list for this problem includes:    Lisinopril 20 Mg Tabs (Lisinopril) .Marland Kitchen... 1/2 tab by mouth bid  Her updated medication list for this problem includes:    Lisinopril 20 Mg Tabs (Lisinopril) .Marland Kitchen... 1/2 tab by mouth bid  BP today: 104/64 Prior BP: 122/76 (10/09/2008)  Labs Reviewed: K+: 4.5 (09/19/2008) Creat: : 0.58 (09/19/2008)   Chol: 151 (09/14/2008)   HDL: 55 (09/14/2008)   LDL: 84 (09/14/2008)   TG: 62 (09/14/2008)  Problem # 4:  THYROID NODULE (ICD-241.0) She has thyroid biopsy scheduled.  Complete Medication List: 1)  Aspirin 81 Mg Tbec (Aspirin) .... Take 1 tablet by mouth once a day 2)  Metformin Hcl 850 Mg Tabs (Metformin hcl) .Marland Kitchen.. 1 tab by mouth by mouth bid 3)  Lisinopril 20 Mg Tabs (Lisinopril) .... 1/2 tab by mouth bid 4)  Lovastatin 20 Mg Tabs (Lovastatin) .... Take 1 tablet by mouth once a day 5)  Free Style Test Strips  .... Use daily as directed 6)  1st Choice Lancets Thin  Misc (Lancets) .... Use twice daily as directed 7)  Antivert 25 Mg Tabs (Meclizine hcl) .Marland Kitchen.. 1-2 tabs by mouth two times a day as needed dizziness 8)  Maxalt 10 Mg Tabs (Rizatriptan benzoate) .... Take as needed for migraines 9)  Lamotrigine 100 Mg Tabs (Lamotrigine) .Marland Kitchen.. 1 tab by mouth bid 10)  Tramadol Hcl 50 Mg Tabs (Tramadol hcl) .Marland Kitchen.. 1 tab by mouth bid  Patient Instructions: 1)  Stay on current meds. 2)  Stay on Low Carb Diet.  3)  Try to get 8 hrs sleep and regular exercise. 4)  Take 1000 - 2000 International Units of Vitamin D daily. 5)  Return for f/u Diabetes in 2 months. Prescriptions: METFORMIN HCL 850 MG TABS (METFORMIN HCL) 1 tab by mouth by mouth bid  #180 x 1   Entered and Authorized by:   Seymour Bars DO   Signed by:   Seymour Bars DO on 11/28/2008   Method used:   Electronically to        [facility]* (mail-order)             ,          Ph: [phone]       Fax: [phone]   RxID:   [id number]

## 2010-02-12 NOTE — Letter (Signed)
Summary: [facility]  [facility]   Imported By: Lanelle Bal 10/15/2009 10:40:48  _____________________________________________________________________  External Attachment:    Type:   Image     Comment:   External Document

## 2010-02-12 NOTE — Assessment & Plan Note (Signed)
Summary: f/u DM   Vital Signs:  Patient Profile:   50 Years Old Female Height:     65 inches Weight:      256 pounds BMI:     42.75 Pulse rate:   92 / minute BP sitting:   103 / 68  (left arm) Cuff size:   large  Vitals Entered By: Harlene Salts (August 10, 2007 8:09 AM)                  Visit Type:  f/u  Chief Complaint:  DM.  History of Present Illness: Looking into breast reduction.  Plans to call for appt. EMG --in [place], + for bilat carpal tunnel syndrome.  Diabetes Management History:      The patient is a 50 years old female who comes in for evaluation of DM Type 2.  She has not been enrolled in the "Diabetic Education Program".  She states understanding of dietary principles and is following her diet appropriately.  No sensory loss is reported.  Self foot exams are being performed.  She is checking home blood sugars.  She says that she is not exercising regularly.        Hypoglycemic symptoms are not occurring.  No hyperglycemic symptoms are reported.  Other comments include: She has done some walking and watching diet but has failed to lose wt.  Plantar fasciitis still limiting her but she is wearing her orthotics.  .        There are no symptoms to suggest diabetic complications.  Other questions/concerns include: Has bilat carpal tunnel since 2000-- had EMG.  numbness and pain getting worse.  .  Since her last visit, no infections have occurred.  She's had no treatment plan problems.      Current Allergies: ! * ORAL CONTRACEPTIVE PILLS     Review of Systems  General      Denies fatigue, loss of appetite, sweats, and weight loss.  Eyes      Denies blurring.  CV      Denies chest pain or discomfort, palpitations, shortness of breath with exertion, and swelling of feet.  GU      Denies urinary frequency.   Physical Exam  General:     alert, well-developed, well-nourished, and well-hydrated.  obese Eyes:     conjunctiva clear Mouth:     good  dentition and pharynx pink and moist.   Neck:     no masses.   Lungs:     Normal respiratory effort, chest expands symmetrically. Lungs are clear to auscultation, no crackles or wheezes. Heart:     Normal rate and regular rhythm. S1 and S2 normal without gallop, murmur, click, rub or other extra sounds. Pulses:     2+ radial and pedal pulses Extremities:     trace LE edema Skin:     color normal.   Cervical Nodes:     No lymphadenopathy noted  Diabetes Management Exam:    Foot Exam (with socks and/or shoes not present):       Sensory-Monofilament:          Left foot: normal          Right foot: normal    Impression & Recommendations:  Problem # 1:  DIABETES MELLITUS II, UNCOMPLICATED (ICD-250.00) A1C improved from 7.0 to 6.4.  Walking more and watching diet.  Continue Avandamet and needs to work on wt loss.  Eye exam is UTD, microalbumin is UTD. Her updated medication list for  this problem includes:    Aspirin 81 Mg Tbec (Aspirin) .Marland Kitchen... Take 1 tablet by mouth once a day    Avandamet 2-500 Mg Tabs (Rosiglitazone-metformin) ..... One by mouth twice daily    Lisinopril 20 Mg Tabs (Lisinopril) .Marland Kitchen... Take 1 tablet by mouth once a day  Orders: Creatinine  (74259) Urine Microalbumin (82044) Hemoglobin A1C (83036) Fingerstick (56387)  Labs Reviewed: HgBA1c: 6.4 (08/10/2007)   Creat: 0.63 (06/01/2007)   Microalbumin: 30mg /L (08/10/2007)   Problem # 2:  HYPERTENSION, BENIGN SYSTEMIC (ICD-401.1) BP looks great.  Continue Lisinopril.  Labs UTD. Her updated medication list for this problem includes:    Lisinopril 20 Mg Tabs (Lisinopril) .Marland Kitchen... Take 1 tablet by mouth once a day  BP today: 103/68 Prior BP: 117/77 (03/24/2007)  Labs Reviewed: Creat: 0.63 (06/01/2007) Chol: 131 (06/01/2007)   HDL: 57 (06/01/2007)   LDL: 63 (06/01/2007)   TG: 55 (06/01/2007)   Problem # 3:  HYPERLIPIDEMIA (ICD-272.4) Labs are UTD.  Cholestrol at goal.   Her updated medication list for this  problem includes:    Lovastatin 20 Mg Tabs (Lovastatin) .Marland Kitchen... Take 1 tablet by mouth once a day  Labs Reviewed: Chol: 131 (06/01/2007)   HDL: 57 (06/01/2007)   LDL: 63 (06/01/2007)   TG: 55 (06/01/2007) SGOT: 18 (06/01/2007)   SGPT: 10 (06/01/2007)   Problem # 4:  CARPAL TUNNEL SYNDROME, BILATERAL (ICD-354.0) C/O increased pain and numbness of both hands. with hx of carpal tunnel since 2000.  EMG done in [place] and I do not have a copy of her report.  Pt will request ortho referral in [place] for f/u. Orders: Orthopedic Referral (Ortho)   Complete Medication List: 1)  Aspirin 81 Mg Tbec (Aspirin) .... Take 1 tablet by mouth once a day 2)  Avandamet 2-500 Mg Tabs (Rosiglitazone-metformin) .... One by mouth twice daily 3)  Lisinopril 20 Mg Tabs (Lisinopril) .... Take 1 tablet by mouth once a day 4)  Lovastatin 20 Mg Tabs (Lovastatin) .... Take 1 tablet by mouth once a day 5)  Free Style Test Strips  .... Use daily as directed  Diabetes Management Assessment/Plan:      The following lipid goals have been established for the patient: Total cholesterol goal of 200; LDL cholesterol goal of 100; HDL cholesterol goal of 40; Triglyceride goal of 200.  Her blood pressure goal is < 130/80.     Patient Instructions: 1)  Call with info on Orthopedist you want to see for carpal tunnel syndrome. 2)  Stay on current meds and continue to work on diet, exercise and wt loss. 3)  Return for f/u diabetes/ BP / flu shot in 4 mos.   ]  Visit Type:  f/u  Chief Complaint:  DM.  History of Present Illness: Looking into breast reduction.  Plans to call for appt. EMG --in [place], + for bilat carpal tunnel syndrome.  Diabetes Management History:      The patient is a 50 years old female who comes in for evaluation of DM Type 2.  She has not been enrolled in the "Diabetic Education Program".  She states understanding of dietary principles and is following her diet appropriately.  No sensory loss is reported.  Self  foot exams are being performed.  She is checking home blood sugars.  She says that she is not exercising regularly.        Hypoglycemic symptoms are not occurring.  No hyperglycemic symptoms are reported.  Other comments include: She has done some  walking and watching diet but has failed to lose wt.  Plantar fasciitis still limiting her but she is wearing her orthotics.  .        There are no symptoms to suggest diabetic complications.  Other questions/concerns include: Has bilat carpal tunnel since 2000-- had EMG.  numbness and pain getting worse.  .  Since her last visit, no infections have occurred.  She's had no treatment plan problems.     Laboratory Results   Urine Tests  Date/Time Recieved: August 10, 2007 8:24 AM  Date/Time Reported: August 10, 2007 8:24 AM  Microalbumin (urine): 30mg /L mg/L    Creatinine 200mg /dL A:C Ratio 30mg /g Comments: normal microalbumin  Blood Tests   Date/Time Recieved: August 10, 2007 8:23 AM  Date/Time Reported: August 10, 2007 8:23 AM   HGBA1C: 6.4%   (Normal Range: Non-Diabetic - 3-6%   Control Diabetic - 6-8%)

## 2010-02-12 NOTE — Progress Notes (Signed)
Summary: Requests same day apt for nausea and epigastric pain  Phone Note Call from Patient   Caller: Patient Summary of Call: Pt c/o dizziness, nausea, and epigastric pain. Pt requests same day apt. Pt transferred to scheduling for apt.  Initial call taken by: Payton Spark CMA,  September 19, 2008 1:20 PM

## 2010-02-12 NOTE — Assessment & Plan Note (Signed)
Summary: Diabetes f/u   Vital Signs:  Patient Profile:   50 Years Old Female Height:     65 inches Weight:      257 pounds BMI:     42.92 Pulse rate:   91 / minute BP sitting:   117 / 83  (left arm) Cuff size:   large  Vitals Entered By: Harlene Salts (March 30, 2006 4:10 PM)               Visit Type:  f/u PCP:  Wynell Balloon  Chief Complaint:  follow-up DM.  History of Present Illness: 50 yo F presents for f/u diabetes.  Continues to have R sided neck pain from degenerative disc disease.  Doing some home PT.  Takes Aleve, but this is not helping anymore.   neck pain, ketoprofen  diabetes, dietary non-adherence, sweets,  eye exam June monofilament done microalbumin done- normal no CP or SOBsome burning under feet  Diabetes Management History:      The patient is a 50 years old female who comes in for evaluation of DM Type 2.  She has not been enrolled in the "Diabetic Education Program".  She states understanding of dietary principles but she is not following the appropriate diet.  No sensory loss is reported.  Self foot exams are not being performed.  She is checking home blood sugars.  She says that she is not exercising regularly.        Hypoglycemic symptoms are not occurring.  No hyperglycemic symptoms are reported.  Other comments include: admits to dietary non-adherence, blood sugars AM <110 and 2 hr PP <150.  Marland Kitchen        There are no symptoms to suggest diabetic complications.  Since her last visit, no infections have occurred.  No changes have been made to her treatment plan since last visit.  Dietary compliance is reported to be poor.     Prior Medications: ASPIRIN 81 MG TBEC (ASPIRIN) Take 1 tablet by mouth once a day AVANDAMET 2-500 MG TABS (ROSIGLITAZONE-METFORMIN) one by mouth twice daily LISINOPRIL 20 MG TABS (LISINOPRIL) Take 1 tablet by mouth once a day LOVASTATIN 20 MG TABS (LOVASTATIN) Take 1 tablet by mouth once a day Current Allergies: ! * ORAL  CONTRACEPTIVE PILLS    Risk Factors:  Exercise:  no   Review of Systems      See HPI   Physical Exam  General:     alert and well-developed.  obese female Head:     normocephalic and atraumatic.   Eyes:     pupils equal, pupils round, and pupils reactive to light.   Nose:     no nasal discharge.   Mouth:     good dentition and pharynx pink and moist.   Neck:     no masses.   Lungs:     Normal respiratory effort, chest expands symmetrically. Lungs are clear to auscultation, no crackles or wheezes. Heart:     Normal rate and regular rhythm. S1 and S2 normal without gallop, murmur, click, rub or other extra sounds. Pulses:     2+ pedal pulses Extremities:     trace pedal edema Neurologic:     gait normal and DTRs symmetrical and normal.   Skin:     color normal and no rashes.   Cervical Nodes:     No lymphadenopathy noted  Diabetes Management Exam:    Foot Exam (with socks and/or shoes not present):  Sensory-Pinprick/Light touch:          Left medial foot (L-4): normal          Left dorsal foot (L-5): normal          Left lateral foot (S-1): normal          Right medial foot (L-4): normal          Right dorsal foot (L-5): normal          Right lateral foot (S-1): normal       Sensory-Monofilament:          Left foot: normal          Right foot: normal       Sensory-other: calluses on plantar surface of both feet       Inspection:          Left foot: normal          Right foot: normal       Nails:          Left foot: normal          Right foot: normal    Impression & Recommendations:  Problem # 1:  DIABETES MELLITUS II, UNCOMPLICATED (ICD-250.00) A1C 6.7, up from 6.2 with weight gain.  Discussed w/ pt the importance of regular exercise, diabetic diet compliance and weight loss.  Continue current meds and checking glucose 1-2 x a day.  On ASA and ACEi.  Due for labs.  If A1c increased at 3 month f/u, can change regimen.   Her updated medication list  for this problem includes:    Aspirin 81 Mg Tbec (Aspirin) .Marland Kitchen... Take 1 tablet by mouth once a day    Avandamet 2-500 Mg Tabs (Rosiglitazone-metformin) ..... One by mouth twice daily    Lisinopril 20 Mg Tabs (Lisinopril) .Marland Kitchen... Take 1 tablet by mouth once a day  Orders: T-Lipid Profile (16109-60454) T-Comprehensive Metabolic Panel (09811-91478)   Problem # 2:  HYPERTENSION, BENIGN SYSTEMIC (ICD-401.1) BP at goal.  Continue Lisinopril. Her updated medication list for this problem includes:    Lisinopril 20 Mg Tabs (Lisinopril) .Marland Kitchen... Take 1 tablet by mouth once a day   Problem # 3:  HYPERLIPIDEMIA (ICD-272.4) Due for FLP.  Continue Lovastatin, goal LDL <100. Her updated medication list for this problem includes:    Lovastatin 20 Mg Tabs (Lovastatin) .Marland Kitchen... Take 1 tablet by mouth once a day   Problem # 4:  ARTHRITIS, CERVICAL SPINE (ICD-721.90) Chronic neck pain into the trapezius muscles, worsened by large breasts.  In addition to weight loss and wearing a supportive bra, discussed option of breast reduction surgery.  In meantime, try Ketoprofen for pain and inflammation.  Medications Added to Medication List This Visit: 1)  Ketoprofen 75 Mg Caps (Ketoprofen) .Marland Kitchen.. 1 tab by mouth three times a day as needed pain  Diabetes Management Assessment/Plan:      The following lipid goals have been established for the patient: Total cholesterol goal of 200; LDL cholesterol goal of 100; HDL cholesterol goal of 40; Triglyceride goal of 200.  Her blood pressure goal is < 130/80.     Patient Instructions: 1)  have fasting labs drawn. 2)  Try Ketoprofen for pain.  Do not take w/ other NSAIDs 3)  Work on diet and regular exercise. 4)  See back in 3 mos for f/u DM.

## 2010-02-12 NOTE — Consult Note (Signed)
Summary: [facility]  [facility]   Imported By: Lanelle Bal 01/21/2008 15:25:17  _____________________________________________________________________  External Attachment:    Type:   Image     Comment:   External Document

## 2010-02-12 NOTE — Consult Note (Signed)
Summary: [facility]  [facility]   Imported By: Lanelle Bal 11/02/2008 10:20:06  _____________________________________________________________________  External Attachment:    Type:   Image     Comment:   External Document

## 2010-02-12 NOTE — Assessment & Plan Note (Signed)
Summary: f/u DM   Vital Signs:  Patient profile:   50 year old female Height:      65 inches Weight:      237 pounds BMI:     39.58 O2 Sat:      98 % on Room air Pulse rate:   104 / minute BP sitting:   122 / 68  (left arm) Cuff size:   large  Vitals Entered By: Payton Spark CMA (April 27, 2009 8:16 AM)  O2 Flow:  Room air CC: F/U. Also c/o dry cough.    Primary Care Provider:  Seymour Bars D.O.  CC:  F/U. Also c/o dry cough. .  History of Present Illness: 50 yo F presents for f/u DM visit.  She saw Dr Sherrie George back for her diabetic eye exam in march and it was normal.  She has lost 10 lbs by changing her diet some.  Her exercise is limited due to her work schedule and fatigue from papillary thyroid cancer.  Her AM fastings are 120s.  Her 2 hr PPs are <160.  She is on Metformin 850 mg two times a day.    She has a bit of a chronic cough that started just before she had thyroid surgery.  She has some postnasal drip.  Denies SOB or chest tightness.  Denies heartburn or sputum production.  She has been on an ACEi for a few years now.  Current Medications (verified): 1)  Aspirin 81 Mg Tbec (Aspirin) .... Take 1 Tablet By Mouth Once A Day 2)  Metformin Hcl 850 Mg Tabs (Metformin Hcl) .Marland Kitchen.. 1 Tab By Mouth By Mouth Bid 3)  Lisinopril 20 Mg Tabs (Lisinopril) .... 1/2 Tab By Mouth Bid 4)  Lovastatin 20 Mg Tabs (Lovastatin) .... Take 1 Tablet By Mouth Once A Day 5)  Free Style Test Strips .... Use Daily As Directed 6)  1st Choice Lancets Thin  Misc (Lancets) .... Use Twice Daily As Directed 7)  Maxalt 10 Mg Tabs (Rizatriptan Benzoate) .... Take As Needed For Migraines 8)  Lamotrigine 100 Mg Tabs (Lamotrigine) .Marland Kitchen.. 1 Tab By Mouth Bid 9)  Tramadol Hcl 50 Mg Tabs (Tramadol Hcl) .Marland Kitchen.. 1 Tab By Mouth Bid 10)  Levothroid 175 Mcg Tabs (Levothyroxine Sodium) .... Take 1 Tab By Mouth Every Morning 11)  Norflex 100mg  .... Take 1 Tab By Mouth Two Times A Day  Allergies (verified): 1)  ! * Oral  Contraceptive Pills  Past History:  Past Medical History: cervical, thoracic, lumbar spondylosis  G3P2012 Insulin dependent diabetes during pregnancy  lumbar disc herniation  seborrheic dermatitis  Thalasemia minor; Hgb 11.6 2-07 DM Type II papillary thyroid cancer 02-2009, Dr Lowella Grip  11-08 last pap Dr Loleta Chance for migraines/ vertigo  Past Surgical History: ETT neg for ischemia- 7 mets  pelvic u/s normal total thyroidectomy for thyroid cancer 02-2009  Family History: Reviewed history from 11/12/2005 and no changes required. 6 other sibblings healthy  father died, AMI @ 79, DM  MGM died of ovarian cancer  mother DM  youngest brother w/ DM, HTN, DM  Social History: Reviewed history from 10/21/2005 and no changes required. 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  Married to husband Patrcia Dolly and has 2 kids 16 and 4.  Moved from [place] in 2006.  Wants to lose wt.  Not exercising.  Review of Systems      See HPI  Physical Exam  General:  alert, well-developed, well-nourished, and well-hydrated.  obese in NAD Head:  normocephalic and atraumatic.   Mouth:  good dentition and pharynx pink and moist.   Neck:  supple.   Lungs:  Normal respiratory effort, chest expands symmetrically. Lungs are clear to auscultation, no crackles or wheezes. Heart:  Normal rate and regular rhythm. S1 and S2 normal without gallop, murmur, click, rub or other extra sounds. Pulses:  2+ radial and pedal pulses Extremities:  no LE edema Skin:  color normal.  anterior neck incision healing up. Cervical Nodes:  No lymphadenopathy noted Psych:  good eye contact, not anxious appearing, and not depressed appearing.     Impression & Recommendations:  Problem # 1:  DIABETES MELLITUS II, UNCOMPLICATED (ICD-250.00)  A1C 7.2.  Just above goal and up from 7.0.  Lost 10 lbs which is a good start.  Continue current meds.  Work on diet, exercise, wt loss.  F/u for DM in 4 mos.  Her urine micro, monofilament and eye  exam are UTD.   Her updated medication list for this problem includes:    Aspirin 81 Mg Tbec (Aspirin) .Marland Kitchen... Take 1 tablet by mouth once a day    Metformin Hcl 850 Mg Tabs (Metformin hcl) .Marland Kitchen... 1 tab by mouth by mouth bid    Lisinopril 20 Mg Tabs (Lisinopril) .Marland Kitchen... 1/2 tab by mouth bid  Labs Reviewed: Creat: 0.58 (09/19/2008)   Microalbumin: 10 (09/13/2008)  Last Eye Exam: no retinopathy, Dr Sherrie George (04/07/2008) Reviewed HgBA1c results: 7.0 (09/13/2008)  6.5 (04/13/2008)  Orders: Fingerstick (36416) Hemoglobin A1C (16109)  Problem # 2:  CARCINOMA, THYROID GLAND, PAPILLARY (ICD-193) Reviewed her notes from Dr Jimmye Norman. She is 6 wks out from a total thyroidectomy and is getting regulated on Levoxyl.    Problem # 3:  HYPERTENSION, BENIGN SYSTEMIC (ICD-401.1) BP at goal.  Dry cough may be ACEi- related but she also has allergies and a recent thryoid surgery that has been causing throat irritation.  Will give her 3 more weeks with allergy meds on board to see if cough improves.  If not will change her to ARB to try.   Her updated medication list for this problem includes:    Lisinopril 20 Mg Tabs (Lisinopril) .Marland Kitchen... 1/2 tab by mouth bid  BP today: 122/68 Prior BP: 123/78 (01/03/2009)  Labs Reviewed: K+: 4.5 (09/19/2008) Creat: : 0.58 (09/19/2008)   Chol: 151 (09/14/2008)   HDL: 55 (09/14/2008)   LDL: 84 (09/14/2008)   TG: 62 (09/14/2008)  Problem # 4:  HYPERLIPIDEMIA (ICD-272.4) Continue current meds.  Labs due in October. Her updated medication list for this problem includes:    Lovastatin 20 Mg Tabs (Lovastatin) .Marland Kitchen... Take 1 tablet by mouth once a day  Labs Reviewed: SGOT: 14 (09/19/2008)   SGPT: 9 (09/19/2008)   HDL:55 (09/14/2008), 57 (06/01/2007)  LDL:84 (09/14/2008), 63 (06/01/2007)  Chol:151 (09/14/2008), 131 (06/01/2007)  Trig:62 (09/14/2008), 55 (06/01/2007)  Complete Medication List: 1)  Aspirin 81 Mg Tbec (Aspirin) .... Take 1 tablet by mouth once a day 2)  Metformin Hcl  850 Mg Tabs (Metformin hcl) .Marland Kitchen.. 1 tab by mouth by mouth bid 3)  Lisinopril 20 Mg Tabs (Lisinopril) .... 1/2 tab by mouth bid 4)  Lovastatin 20 Mg Tabs (Lovastatin) .... Take 1 tablet by mouth once a day 5)  Free Style Test Strips  .... Use daily as directed 6)  1st Choice Lancets Thin Misc (Lancets) .... Use twice daily as directed 7)  Maxalt 10 Mg Tabs (Rizatriptan benzoate) .... Take as needed for migraines 8)  Lamotrigine 100 Mg Tabs (  Lamotrigine) .Marland Kitchen.. 1 tab by mouth bid 9)  Tramadol Hcl 50 Mg Tabs (Tramadol hcl) .Marland Kitchen.. 1 tab by mouth bid 10)  Levothroid 175 Mcg Tabs (Levothyroxine sodium) .... Take 1 tab by mouth every morning 11)  Norflex 100mg   .... Take 1 tab by mouth two times a day  Patient Instructions: 1)  A1C 7.2 OK. 2)  Keep working on [employer] and regular exercise. 3)  10 lbs weight loss is great!   4)  Change Allergy medicine to Zyrtec 10 mg in the evenings. 5)  Call if cough has not improved in 3-4 wks. 6)  Return for f/u Diabetes/ HTN in 4 mos. Prescriptions: LOVASTATIN 20 MG TABS (LOVASTATIN) Take 1 tablet by mouth once a day  #90 x 2   Entered and Authorized by:   Seymour Bars DO   Signed by:   Seymour Bars DO on 04/27/2009   Method used:   Electronically to        [facility]* (mail-order)             ,          Ph: [phone]       Fax: [phone]   RxID:   [id number] LISINOPRIL 20 MG TABS (LISINOPRIL) 1/2 tab by mouth bid  #90 x 0   Entered and Authorized by:   Seymour Bars DO   Signed by:   Seymour Bars DO on 04/27/2009   Method used:   Electronically to        [facility]* (mail-order)             ,          Ph: [phone]       Fax: [phone]   RxID:   [id number] METFORMIN HCL 850 MG TABS (METFORMIN HCL) 1 tab by mouth by mouth bid  #180 x 2   Entered and Authorized by:   Seymour Bars DO   Signed by:   Seymour Bars DO on 04/27/2009   Method used:   Electronically to        [facility]* (mail-order)             ,           Ph: [phone]       Fax: [phone]   RxID:   [id number]   Laboratory Results   Blood Tests     HGBA1C: 7.2%   (Normal Range: Non-Diabetic - 3-6%   Control Diabetic - 6-8%)

## 2010-02-12 NOTE — Letter (Signed)
Summary: [facility]  [facility]   Imported By: Lanelle Bal 04/10/2009 08:58:25  _____________________________________________________________________  External Attachment:    Type:   Image     Comment:   External Document

## 2010-02-12 NOTE — Miscellaneous (Signed)
Summary: Flu Shot/[place] [facility]  Flu Shot/[place] [facility]   Imported By: Lanelle Bal 10/24/2008 09:41:07  _____________________________________________________________________  External Attachment:    Type:   Image     Comment:   External Document

## 2010-02-12 NOTE — Miscellaneous (Signed)
Summary: mammogram  Clinical Lists Changes  Observations: Added new observation of MAMMO DUE: 01/2008 (01/13/2007 11:34) Added new observation of MAMMOGRAM: No specific mammographic evidence of malignancy.  No significant changes compared to previous study.  Assessment: BIRADS 1. Location: [facility].    (01/04/2007 11:35)       Mammogram  Procedure date:  01/04/2007  Findings:      No specific mammographic evidence of malignancy.  No significant changes compared to previous study.  Assessment: BIRADS 1. Location: [facility].     Procedures Next Due Date:    Mammogram: 01/2008   Mammogram  Procedure date:  01/04/2007  Findings:      No specific mammographic evidence of malignancy.  No significant changes compared to previous study.  Assessment: BIRADS 1. Location: [facility].     Procedures Next Due Date:    Mammogram: 01/2008

## 2010-02-12 NOTE — Letter (Signed)
Summary: Lipid Letter  [facility]  [address]   [place], [place] [address]   Phone: [phone]  Fax: [phone]    04/07/2006  Martha Woods [address] [place], [place]  [address]  Dear Sedalia Muta:  We have carefully reviewed your last lipid profile from 04/06/2006 and the results are noted below with a summary of recommendations for lipid management.    Cholesterol:       140     Goal: <  200   HDL "good" Cholesterol:   58     Goal: >  49   LDL "bad" Cholesterol:     72     Goal: <  100   Triglycerides:       49     Goal: <  150    CMET normal    TLC Diet (Therapeutic Lifestyle Change): Saturated Fats & Transfatty acids should be kept < 7% of total calories ***Reduce Saturated Fats Polyunstaurated Fat can be up to 10% of total calories Monounsaturated Fat Fat can be up to 20% of total calories Total Fat should be no greater than 25-35% of total calories Carbohydrates should be 50-60% of total calories Protein should be approximately 15% of total calories Fiber should be at least 20-30 grams a day ***Increased fiber may help lower LDL Total Cholesterol should be < 200mg /day Consider adding plant stanol/sterols to diet (example: Benacol spread) ***A higher intake of unsaturated fat may reduce Triglycerides and Increase HDL    Adjunctive Measures (may lower LIPIDS and reduce risk of Heart Attack) include: Aerobic Exercise (20-30 minutes 3-4 times a week) Limit Alcohol Consumption Weight Reduction Dietary Fiber 20-30 grams a day by mouth     Current Medications: 1)    Aspirin 81 Mg Tbec (Aspirin) .... Take 1 tablet by mouth once a day 2)    Avandamet 2-500 Mg Tabs (Rosiglitazone-metformin) .... One by mouth twice daily 3)    Lisinopril 20 Mg Tabs (Lisinopril) .... Take 1 tablet by mouth once a day 4)    Lovastatin 20 Mg Tabs (Lovastatin) .... Take 1 tablet by mouth once a day 5)    Ketoprofen 75 Mg Caps (Ketoprofen) .Marland Kitchen.. 1  tab by mouth three times a day as needed pain  If you have any questions, please call. We appreciate being able to work with you.   Sincerely,    [facility]

## 2010-02-12 NOTE — Progress Notes (Signed)
Summary: [facility] RECORDS FAXED  Phone Note Other Incoming   Call placed by: [facility] Reason for Call: Get patient information Summary of Call: FAXED RECORDS TO [facility] PER REQUEST.  11-26-06 Initial call taken by: Sarina Ill,  November 26, 2006 10:53 AM

## 2010-02-12 NOTE — Progress Notes (Signed)
Summary: has ?  Phone Note Call from Patient Call back at [phone]   Caller: Patient- voice mail Call For: Isabelle Course Reason for Call: Talk to Nurse Summary of Call: Would like for [facility] to return her call. Has ? Initial call taken by: Warnell Forester,  December 17, 2007 8:17 AM  Follow-up for Phone Call        PATIENT NEED TO GO UP A SIZE ON RIGHT WRIST SPLINT SAID ITS PRESSING INTO HER ARM. NURSE INFORMED PATIENT TO COME IN OFFICE TO GET NEW SPLINT. Follow-up by: Harlene Salts,  December 17, 2007 9:01 AM      Appended Document: has ?

## 2010-02-12 NOTE — Miscellaneous (Signed)
Summary: normal diabetic eye exam  Clinical Lists Changes  Observations: Added new observation of DIAB EYE EX: no retinopathy, Dr Sherrie George (04/07/2008 12:48)

## 2010-02-12 NOTE — Assessment & Plan Note (Signed)
Summary: URI   Vital Signs:  Patient Profile:   50 Years Old Female Height:     65 inches Weight:      256 pounds O2 Sat:      99 % Temp:     97.1 degrees F oral Pulse rate:   88 / minute BP sitting:   120 / 81  (left arm) Cuff size:   large  Vitals Entered By: Harlene Salts (November 02, 2006 10:52 AM)                 Visit Type:  acute PCP:  Seymour Bars D.O.  Chief Complaint:  Cold & URI symptoms and WITH SORETHROAT.  History of Present Illness: 50 yo F seen for  3-4 days of fevers, malaise, bodyaches,  headache, productive cough of yellow sputum and sore throat.  Denies SOB or chest tightness.  Taking Nyquil and Dayquil which is not helping.  Sleeping OK at night with some cough and congestion.  No GI upset.  Drinking plenty of clear fluids and Advil is helping with HAs.  Works as a Patent examiner.  Current Allergies: ! * ORAL CONTRACEPTIVE PILLS      Physical Exam  General:     alert, well-developed, well-nourished, and well-hydrated.  obese Head:     normocephalic and atraumatic.  sinuses NTTP Eyes:     conjunctiva clear Ears:     EACs patent; TMs translucent and gray with good cone of light and bony landmarks.  Nose:     nasal congestion, clear rhinorrhea Mouth:     good dentition and pharynx pink and moist.  o/p injected; clear postnasal drip and cobblestoning; 1-2+ tonsilar enlargment, no exudates Neck:     no masses.   Lungs:     normal respiratory effort, no accessory muscle use, and normal breath sounds.   Heart:     Normal rate and regular rhythm. S1 and S2 normal without gallop, murmur, click, rub or other extra sounds. Abdomen:     soft, non-tender, normal bowel sounds, no distention, no masses, and no guarding.   Pulses:     cap refill < 2 sec bilat Extremities:     no LE edema Skin:     color normal and no rashes.  skin warm and dry Cervical Nodes:     shotty anterior cervical node LA    Impression & Recommendations:  Problem  # 1:  URI (ICD-465.9) Viral URI.  Rapid strep negative.  Supportive care with Mucinex D in the AM and Tussionex at night.  Can use ADvil as needed ST pain and HA's.  Clear fluids, rest.  Out of work today.  If not improving or still running low grade fevers by Friday, OK to [employer].  Has f/u end of the month for DM. Flu shot next visit. Her updated medication list for this problem includes:    Aspirin 81 Mg Tbec (Aspirin) .Marland Kitchen... Take 1 tablet by mouth once a day    Ketoprofen 75 Mg Caps (Ketoprofen) .Marland Kitchen... 1 tab by mouth three times a day as needed pain    Tussionex Pennkinetic Er 8-10 Mg/30ml Lqcr (Chlorpheniramine-hydrocodone) .Marland Kitchen... 1 tsp by mouth at bedtime as needed cough  Orders: Rapid Strep ([address])   Complete Medication List: 1)  Aspirin 81 Mg Tbec (Aspirin) .... Take 1 tablet by mouth once a day 2)  Avandamet 2-500 Mg Tabs (Rosiglitazone-metformin) .... One by mouth twice daily 3)  Lisinopril 20 Mg Tabs (Lisinopril) .Marland KitchenMarland KitchenMarland Kitchen  Take 1 tablet by mouth once a day 4)  Lovastatin 20 Mg Tabs (Lovastatin) .... Take 1 tablet by mouth once a day 5)  Ketoprofen 75 Mg Caps (Ketoprofen) .Marland Kitchen.. 1 tab by mouth three times a day as needed pain 6)  Zithromax Z-pak 250 Mg Tabs (Azithromycin) .... 2 tabs by mouth x 1 then 1 tab by mouth daily x 4 days 7)  Tussionex Pennkinetic Er 8-10 Mg/58ml Lqcr (Chlorpheniramine-hydrocodone) .Marland Kitchen.. 1 tsp by mouth at bedtime as needed cough   Patient Instructions: 1)  Respiratory tract infection is likely viral and will get better with supportive care.  Try Mucinex D for congestion and use RX cough syrup at night. 2)  If you are still feeling sick in the next 4-5 days, go ahead and fill RX for Zithromax. 3)  Return for f/u blood sugars in 1 month.    Prescriptions: TUSSIONEX PENNKINETIC ER 8-10 MG/5ML  LQCR (CHLORPHENIRAMINE-HYDROCODONE) 1 tsp by mouth at bedtime as needed cough  #100 cc x 0   Entered and Authorized by:   Seymour Bars DO   Signed by:   Seymour Bars  DO on 11/02/2006   Method used:   Print then Give to Patient   RxID:   [id number] ZITHROMAX Z-PAK 250 MG  TABS (AZITHROMYCIN) 2 tabs by mouth x 1 then 1 tab by mouth daily x 4 days  #1 pack x 0   Entered and Authorized by:   Seymour Bars DO   Signed by:   Seymour Bars DO on 11/02/2006   Method used:   Print then Give to Patient   RxID:   [id number]  ] Laboratory Results  Date/Time Received: November 02, 2006 11:12 AM  Date/Time Reported: November 02, 2006 11:12 AM   Other Tests  Rapid Strep: negative  Kit Test Internal QC: Negative   (Normal Range: Negative)

## 2010-02-12 NOTE — Progress Notes (Signed)
Summary: Sleep studies  Phone Note Call from Patient   Summary of Call: Pt works for [employer] and would like to know if it is OK w/ you if she has some over night studies done to see if she qualifies for an actual sleep study. Please advise.  Initial call taken by: Payton Spark CMA,  September 28, 2008 11:18 AM  Follow-up for Phone Call        pls check to see if [facility] does an Apnea Link Follow-up by: Seymour Bars DO,  September 28, 2008 11:47 AM  Additional Follow-up for Phone Call Additional follow up Details #1::        [facility] does do apnea link. Pt stated [facility] will do the study w/out going through her insurance. Additional Follow-up by: Payton Spark CMA,  September 29, 2008 11:38 AM

## 2010-02-12 NOTE — Letter (Signed)
Summary: [facility]  [facility]   Imported By: Lanelle Bal 01/09/2009 12:47:14  _____________________________________________________________________  External Attachment:    Type:   Image     Comment:   External Document

## 2010-02-12 NOTE — Assessment & Plan Note (Signed)
Summary: strep throat   Vital Signs:  Patient profile:   49 year old female Height:      65 inches Weight:      247 pounds BMI:     41.25 O2 Sat:      100 % on Room air Temp:     98.5 degrees F oral Pulse rate:   103 / minute BP sitting:   123 / 78  (left arm) Cuff size:   large  Vitals Entered By: Payton Spark CMA (January 03, 2009 9:53 AM)  O2 Flow:  Room air CC: ST and fever x 1 day.   Primary Care Provider:  Seymour Bars D.O.  CC:  ST and fever x 1 day.Marland Kitchen  History of Present Illness: 50 yo F presents for fever and chills with ST x 1 days.  Felt very tired yesterday.  No runny nose or cough.  She has a migraine.  She has felt nauseated.  She is on Lamictal with as needed Tramadol.  No rash.  She has not been around strep.    She was recently diagnosed with papillary thyroid cancer.  She is scheduled for a complete thyroidectomy on Jan 4th, neck dissection, and radioactive iodine.      Allergies (verified): 1)  ! * Oral Contraceptive Pills  Past History:  Past Medical History: Reviewed history from 11/28/2008 and no changes required. cervical, thoracic, lumbar spondylosis  [id number] Insulin dependent diabetes during pregnancy  lumbar disc herniation  seborrheic dermatitis  Thalasemia minor; Hgb 11.6 2-07 DM Type II  11-08 last pap Dr Loleta Chance for migraines/ vertigo  Social History: Reviewed history from 10/21/2005 and no changes required. 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  Married to husband Patrcia Dolly and has 2 kids 16 and 4.  Moved from [place] in 2006.  Wants to lose wt.  Not exercising.  Review of Systems      See HPI  Physical Exam  General:  alert, well-developed, well-nourished, and well-hydrated.  obese Head:  normocephalic and atraumatic.  sinuses NTTP Eyes:  conjunctiva clear; wearing glasses Ears:  EACs patent; TMs translucent and gray with good cone of light and bony landmarks.  Nose:  no nasal congestion Mouth:  throat injected.  No exudates or  vesicles. 1+ tonsilar enlargment Neck:  no masses.   Lungs:  Normal respiratory effort, chest expands symmetrically. Lungs are clear to auscultation, no crackles or wheezes. Heart:  Normal rate and regular rhythm. S1 and S2 normal without gallop, murmur, click, rub or other extra sounds. Skin:  color normal.   Cervical Nodes:  shotty cervical LA   Impression & Recommendations:  Problem # 1:  STREPTOCOCCAL SORE THROAT (ICD-034.0)  Rapid strep +.  Symptomatic x 1 day.  Treat with Bicillin LA injection x 1 today in addiiton to supportive care. Call if symptoms have not improved in 72 hrs.  MMW for comfort.  Avoid contact spread for the next 48 hrs. Her updated medication list for this problem includes:    Aspirin 81 Mg Tbec (Aspirin) .Marland Kitchen... Take 1 tablet by mouth once a day  Orders: Rapid Strep (11914) Bicillin LA 1.2 million units Injection (J0570) Admin of Therapeutic Inj  intramuscular or subcutaneous (78295)  Problem # 2:  CARCINOMA, THYROID GLAND, PAPILLARY (ICD-193) New diagnosis. She is scheduled for a thyroidectomy in 2 wks.   Complete Medication List: 1)  Aspirin 81 Mg Tbec (Aspirin) .... Take 1 tablet by mouth once a day 2)  Metformin Hcl 850 Mg Tabs (  Metformin hcl) .Marland Kitchen.. 1 tab by mouth by mouth bid 3)  Lisinopril 20 Mg Tabs (Lisinopril) .... 1/2 tab by mouth bid 4)  Lovastatin 20 Mg Tabs (Lovastatin) .... Take 1 tablet by mouth once a day 5)  Free Style Test Strips  .... Use daily as directed 6)  1st Choice Lancets Thin Misc (Lancets) .... Use twice daily as directed 7)  Maxalt 10 Mg Tabs (Rizatriptan benzoate) .... Take as needed for migraines 8)  Lamotrigine 100 Mg Tabs (Lamotrigine) .Marland Kitchen.. 1 tab by mouth bid 9)  Tramadol Hcl 50 Mg Tabs (Tramadol hcl) .Marland Kitchen.. 1 tab by mouth bid 10)  Duke's Magic Mouthwash  .Marland Kitchen.. 10 ml swish and gargle 4 x a day as needed for sore throat pain  Patient Instructions: 1)  Bicilin LA injection today for strep throat. 2)  Call if symptoms not  resolved by Monday. 3)  Symptomatic x 48 hrs.   4)  Use Magic Mouthwash up to 4 x a day with Ibuprofen for comfort.  Hot tea, popsicles, plenty of clear fluids, rest will help. 5)  Take 1 Maxalt MLT (sample tab) today for migraine. Prescriptions: DUKE'S MAGIC MOUTHWASH 10 ml swish and gargle 4 x a day as needed for Sore throat pain  #200 ml x 0   Entered and Authorized by:   Seymour Bars DO   Signed by:   Seymour Bars DO on 01/03/2009   Method used:   Printed then faxed to ...       [address] [phone]* (retail)       [address] [place], [place]  [address]       Ph: [phone]       Fax: [phone]   RxID:   [id number]   Laboratory Results    Other Tests  Rapid Strep: positive    Medication Administration  Injection # 1:    Medication: Bicillin LA 1.2 million units Injection    Diagnosis: STREPTOCOCCAL SORE THROAT (ICD-034.0)    Route: IM    Site: RUOQ gluteus    Patient tolerated injection without complications    Given by: Payton Spark CMA (January 03, 2009 10:53 AM)  Orders Added: 1)  Rapid Strep [24401] 2)  Est. Patient Level III [02725] 3)  Bicillin LA 1.2 million units Injection [J0570] 4)  Admin of Therapeutic Inj  intramuscular or subcutaneous [36644]

## 2010-02-12 NOTE — Assessment & Plan Note (Signed)
Summary: CPE with pap/ A1C   Vital Signs:  Patient Profile:   50 Years Old Female LMP:     11/10/2006 Height:     65 inches Weight:      262 pounds BMI:     43.76 Pulse rate:   104 / minute BP sitting:   113 / 80  (left arm) Cuff size:   large  Vitals Entered By: Harlene Salts (November 24, 2006 8:04 AM)  Menstrual History: LMP (date): 11/10/2006                 Visit Type:  est PCP:  Seymour Bars D.O.  Chief Complaint:  DM FOLLOW-UP and PAP SMEAR.  History of Present Illness: 50 yo G3P2 presents for CPE with pap smear.  Still struggling with diet and exercise with continued weight gain.  Has hx of cervical dysplasia, s/p cryo years ago.  Still having regular periods every month, lasting 5 days, heavy for 2 days.  Not using birth control due to SE of headaches.  Using rhythm method to prevent getting pregnant.  Denies skipping periods, hot flashes or night sweats.  Denies any vag discharge, dyspareunia or urinary symptoms.  Monogamous with husband.  Due for her mammogram, pneumovax (never had) and A1C.  It has been > 10 yrs since her last tetanus.  Fam hx of ovarian cancer.  No fam hx of colon or breast cancer. Denies chest pain or DOE.   Current Allergies: ! * ORAL CONTRACEPTIVE PILLS     Review of Systems  General      Denies chills, fatigue, fever, sweats, weakness, and weight loss.  Eyes      Denies blurring.  CV      Denies chest pain or discomfort, palpitations, shortness of breath with exertion, and swelling of feet.  Resp      Denies shortness of breath.  GI      Complains of constipation and hemorrhoids.      Denies abdominal pain, bloody stools, change in bowel habits, dark tarry stools, diarrhea, and nausea.  GU      Denies abnormal vaginal bleeding, decreased libido, discharge, dysuria, genital sores, and incontinence.  MS      Complains of joint pain and stiffness.      L shoulder pain x 1 wk after lifting heavy pt, better with  Aleve   Physical Exam  General:     alert, well-developed, well-nourished, and well-hydrated.  obese Head:     normocephalic and atraumatic.   Eyes:     wears glasses, PERRLA Ears:     EACs patent; TMs translucent and gray with good cone of light and bony landmarks.  Nose:     no nasal discharge.   Mouth:     pharynx pink and moist and fair dentition.   Neck:     no masses.   Breasts:     No mass, nodules, thickening, tenderness, bulging, retraction, inflamation, nipple discharge or skin changes noted.  large breasts Lungs:     normal respiratory effort, no intercostal retractions, no accessory muscle use, and normal breath sounds.   Heart:     normal rate, regular rhythm, and no murmur.   Abdomen:     soft, non-tender, normal bowel sounds, no distention, no masses, no guarding, no hepatomegaly, and no splenomegaly.   Genitalia:     Pelvic Exam:        External: normal female genitalia without lesions or masses  Vagina: normal without lesions or masses        Cervix: normal without lesions or masses        Adnexa: normal bimanual exam without masses or fullness        Uterus: normal by palpation        Pap smear: performed Pulses:     2+ pedal pulses Extremities:     no LE edema Skin:     color normal and no rashes.  thickened great toenails, callused feet, no skin breakdown Cervical Nodes:     No lymphadenopathy noted Axillary Nodes:     No palpable lymphadenopathy    Impression & Recommendations:  Problem # 1:  Gynecological examination-routine (ICD-V72.31) Thin prep pap done.  F/U results. Tdap updated. Pneumovax given for dx of DM. Labs are UTD. Update mammogram. Discussed wt loss, diet and exercise importance and adding MVI with Citrical twice daily. Using rhythm method for birth control.   Problem # 2:  DIABETES MELLITUS II, UNCOMPLICATED (ICD-250.00) A1C at goal.  Continue current meds.  Needs to work on lifestyle. Her updated medication  list for this problem includes:    Aspirin 81 Mg Tbec (Aspirin) .Marland Kitchen... Take 1 tablet by mouth once a day    Avandamet 2-500 Mg Tabs (Rosiglitazone-metformin) ..... One by mouth twice daily    Lisinopril 20 Mg Tabs (Lisinopril) .Marland Kitchen... Take 1 tablet by mouth once a day  Orders: Fingerstick (32202) Hemoglobin A1C (83036)    Labs Reviewed: HgBA1c: 6.6 (11/24/2006)   Creat: 0.71 (04/06/2006)   Microalbumin: 10mg /L (07/06/2006)   Complete Medication List: 1)  Aspirin 81 Mg Tbec (Aspirin) .... Take 1 tablet by mouth once a day 2)  Avandamet 2-500 Mg Tabs (Rosiglitazone-metformin) .... One by mouth twice daily 3)  Lisinopril 20 Mg Tabs (Lisinopril) .... Take 1 tablet by mouth once a day 4)  Lovastatin 20 Mg Tabs (Lovastatin) .... Take 1 tablet by mouth once a day 5)  Ketoprofen 75 Mg Caps (Ketoprofen) .Marland Kitchen.. 1 tab by mouth three times a day as needed pain 6)  Tussionex Pennkinetic Er 8-10 Mg/91ml Lqcr (Chlorpheniramine-hydrocodone) .Marland Kitchen.. 1 tsp by mouth at bedtime as needed cough 7)  Free Style Test Strips  .... Use daily as directed  Other Orders: T-Mammography Bilateral Screening (54270) Tdap => 26yrs IM (62376) Admin 1st Vaccine (28315) Pneumococcal Vaccine (17616) Admin of Any Addtl Vaccine (07371)   Patient Instructions: 1)  Use Aleve every 12 hrs as needed for shoulder pain for additional 7-10 days and if still having problems, call for appt. 2)  Return in 4 mos for next diabetic check up. 3)  Work on diet and exercise. 4)  Mammogram Rx given - call [facility] to schedule. 5)  Tdap and Pneumovax given today.    Prescriptions: FREE STYLE TEST STRIPS use daily as directed  #30 x 6   Entered and Authorized by:   Seymour Bars DO   Signed by:   Seymour Bars DO on 11/24/2006   Method used:   Print then Give to Patient   RxID:   [id number]  ] Laboratory Results   Blood Tests   Date/Time Recieved: November 24, 2006 9:00 AM  Date/Time Reported: November 24, 2006 9:00 AM   HGBA1C:  6.6%   (Normal Range: Non-Diabetic - 3-6%   Control Diabetic - 6-8%)       Tetanus/Td Vaccine    Vaccine Type: Tdap    Site: left deltoid    Mfr: [employer]    Dose:  0.5 ml    Route: IM    Given by: Harlene Salts    Exp. Date: 11/25/2008    Lot #: [id number]    VIS given: 07/24/04 version given November 24, 2006.  Pneumovax Vaccine    Vaccine Type: Pneumovax    Site: right deltoid    Mfr: [employer]    Dose: 0.5 ml    Route: IM    Given by: Harlene Salts    Exp. Date: 03/15/2008    Lot #: [id number]    VIS given: 08/11/95 version given November 24, 2006.

## 2010-02-12 NOTE — Assessment & Plan Note (Signed)
Summary: URI   Vital Signs:  Patient Profile:   50 Years Old Female Height:     65 inches Weight:      261 pounds O2 Sat:      100 % Temp:     98.1 degrees F oral Pulse rate:   91 / minute BP sitting:   116 / 86  (left arm) Cuff size:   large  Vitals Entered By: Harlene Salts (October 28, 2007 3:55 PM)                 Chief Complaint:  Cold & URI symptoms, low grade temp, and chest hurts when coughing.  Acute Visit History:      The patient complains of cough, headache, and nasal discharge.  She denies abdominal pain, chest pain, diarrhea, fever, musculoskeletal symptoms, nausea, sinus problems, sore throat, and vomiting.  Other comments include: Taking Sudafed.  Helps a little.  .        The character of the cough is described as productive.  There is no history of wheezing or shortness of breath associated with her cough.           Current Allergies: ! 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  Moved from [place] in 2006.  Wants to lose wt.  Not exercising.    Review of Systems      See HPI   Physical Exam  General:     alert, well-developed, well-nourished, and well-hydrated.  obese in NAD Head:     normocephalic and atraumatic.  sinuses NTTP Eyes:     conjunctiva clear Ears:     EACs patent; TMs translucent and gray with good cone of light and bony landmarks.  Nose:     clear rhinorrhea Mouth:     throat is mildly injected no exudates or vesicles Neck:     supple.   Lungs:     Normal  respiratory effort, chest expands symmetrically. Lungs are clear to auscultation, no crackles or wheezes. Heart:     Normal rate and regular rhythm. S1 and S2 normal without gallop, murmur, click, rub or other extra sounds. Skin:     color normal.   Cervical Nodes:     shotty bilat anterior cervical LA    Impression & Recommendations:  Problem # 1:  URI (ICD-465.9) OK to use Mucinex DM for cough and congestion, Tylenol as needed.  Out of work today and tomorrow.  Call if not improved after 7 days. Her updated medication list for this problem includes:    Aspirin 81 Mg Tbec (Aspirin) .Marland Kitchen... Take 1 tablet by mouth once a day Instructed on symptomatic treatment. Call if symptoms persist or worsen.   Complete Medication List: 1)  Aspirin 81 Mg Tbec (Aspirin) .... Take 1 tablet by mouth once a day 2)  Avandamet 2-500 Mg Tabs (Rosiglitazone-metformin) .... One by mouth twice daily 3)  Lisinopril 20 Mg Tabs (Lisinopril) .... Take 1 tablet by mouth once a day 4)  Lovastatin 20 Mg Tabs (Lovastatin) .... Take 1 tablet by mouth once a day 5)  Free Style Test Strips  .... Use daily as directed    ]

## 2010-02-12 NOTE — Progress Notes (Signed)
Summary: Lab results and dizziness  Phone Note Call from Patient Call back at Home Phone [phone]   Caller: Patient Call For: Seymour Bars DO Summary of Call: Called Lm wanting lab results and still getting dizzy when stands up. Please advise Initial call taken by: Kathlene November,  September 20, 2008 4:07 PM  Follow-up for Phone Call        see note attached to labs. Follow-up by: Seymour Bars DO,  September 21, 2008 8:16 AM

## 2010-02-12 NOTE — Letter (Signed)
Summary: [facility]  [facility]   Imported By: Lanelle Bal 01/02/2009 11:40:33  _____________________________________________________________________  External Attachment:    Type:   Image     Comment:   External Document

## 2010-02-12 NOTE — Progress Notes (Signed)
Summary: carotid dopplers -- normal.  Phone Note Outgoing Call   Summary of Call: Marcelino Duster, Pls call pt and let her know that her carotid dopplers came back normal.  Some nodules were noted on the thyroid gland.  Send copy of report to her neurologist and will order a thyroid u/s to further eval the nodules for size. Initial call taken by: Seymour Bars DO,  November 01, 2008 9:23 AM  Follow-up for Phone Call        Pt aware Follow-up by: Payton Spark CMA,  November 01, 2008 9:37 AM  New Problems: THYROID NODULE (ICD-241.0)   New Problems: THYROID NODULE (ICD-241.0)

## 2010-02-12 NOTE — Assessment & Plan Note (Signed)
Summary: 4 MO F/U DM   Vital Signs:  Patient Profile:   50 Years Old Female Height:     65 inches Weight:      256 pounds BMI:     42.75 O2 Sat:      99 % Pulse rate:   80 / minute BP sitting:   117 / 77  (left arm) Cuff size:   large  Vitals Entered By: Harlene Salts (March 24, 2007 8:22 AM)                 Visit Type:  f/u  Chief Complaint:  DM follow-up and c/o urine having bad odor.  Diabetes Management History:      The patient is a 50 years old female who comes in for evaluation of DM Type 2.  She has not been enrolled in the "Diabetic Education Program".  She states understanding of dietary principles and is following her diet appropriately.  No sensory loss is reported.  Self foot exams are being performed.  She is checking home blood sugars.  She says that she is not exercising regularly.        Hypoglycemic symptoms are not occurring.  No hyperglycemic symptoms are reported.  Other comments include: AM fastings <120; 2 hr PP <150.  Checks sugar two times a day. Walking a little more.        There are no symptoms to suggest diabetic complications.  Since her last visit, no infections have occurred.  The following treatment plan problems are reported: diet problems.  No changes have been made to her treatment plan since last visit.       Current Allergies: ! 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  Moved from [place] in 2006.  Wants to lose wt.  Not exercising.    Review of Systems      See HPI   Physical Exam  General:     alert, well-developed, well-nourished, and  well-hydrated.  obese Head:     normocephalic and atraumatic.   Eyes:     PERRLA Nose:     no external deformity and no nasal discharge.   Mouth:     good dentition and pharynx pink and moist.   Neck:     supple and no masses.   Lungs:     Normal respiratory effort, chest expands symmetrically. Lungs are clear to auscultation, no crackles or wheezes. Heart:     Normal rate and regular rhythm. S1 and S2 normal without gallop, murmur, click, rub or other extra sounds. Pulses:     2+ radial and pedal pulses Extremities:     trace LE edema Skin:     color normal and no rashes.   Cervical Nodes:     No lymphadenopathy noted  Diabetes Management Exam:    Foot Exam (with socks and/or shoes not present):       Inspection:          Left foot: normal  Right foot: normal       Nails:          Left foot: normal          Right foot: normal    Eye Exam:       Eye Exam done here today    Impression & Recommendations:  Problem # 1:  DIABETES MELLITUS II, UNCOMPLICATED (ICD-250.00) A1C up to 7.0 from 6.6.  Lost 6 # ib 4 mos.  Walking more but limited by heel spurs.  Compliant with meds.  BP at goal.  Due for CMP/ FLP at end of the month.  Continue home monitoring of glucoses, at goal.  Encouraged further weight loss by healthy diet and exercise and recheck in 4 mos.   Her updated medication list for this problem includes:    Aspirin 81 Mg Tbec (Aspirin) .Marland Kitchen... Take 1 tablet by mouth once a day    Avandamet 2-500 Mg Tabs (Rosiglitazone-metformin) ..... One by mouth twice daily    Lisinopril 20 Mg Tabs (Lisinopril) .Marland Kitchen... Take 1 tablet by mouth once a day  Orders: Fingerstick (16109) Hemoglobin A1C (83036) Urinalysis-dipstick w/o micro (81003) T-Comprehensive Metabolic Panel (60454-09811) T-Lipid Profile (91478-29562)   Complete Medication List: 1)  Aspirin 81 Mg Tbec (Aspirin) .... Take 1 tablet by mouth once a day 2)  Avandamet 2-500 Mg Tabs (Rosiglitazone-metformin) ....  One by mouth twice daily 3)  Lisinopril 20 Mg Tabs (Lisinopril) .... Take 1 tablet by mouth once a day 4)  Lovastatin 20 Mg Tabs (Lovastatin) .... Take 1 tablet by mouth once a day 5)  Free Style Test Strips  .... Use daily as directed  Diabetes Management Assessment/Plan:      The following lipid goals have been established for the patient: Total cholesterol goal of 200; LDL cholesterol goal of 100; HDL cholesterol goal of 40; Triglyceride goal of 200.  Her blood pressure goal is < 130/80.     Patient Instructions: 1)  Work on diabetic diet and regular exercise. 2)  Return in 4 mos for f/u diabetes. 3)  Labs due after 3/24    Prescriptions: FREE STYLE TEST STRIPS use daily as directed  #90 x 2   Entered and Authorized by:   Seymour Bars DO   Signed by:   Seymour Bars DO on 03/24/2007   Method used:   Print then Give to Patient   RxID:   [id number] LOVASTATIN 20 MG TABS (LOVASTATIN) Take 1 tablet by mouth once a day  #90 x 2   Entered and Authorized by:   Seymour Bars DO   Signed by:   Seymour Bars DO on 03/24/2007   Method used:   Print then Give to Patient   RxID:   [id number] LISINOPRIL 20 MG TABS (LISINOPRIL) Take 1 tablet by mouth once a day  #90 x 2   Entered and Authorized by:   Seymour Bars DO   Signed by:   Seymour Bars DO on 03/24/2007   Method used:   Print then Give to Patient   RxID:   [id number] AVANDAMET 2-500 MG TABS (ROSIGLITAZONE-METFORMIN) one by mouth twice daily  #180 x 2   Entered and Authorized by:   Seymour Bars DO   Signed by:   Seymour Bars DO on 03/24/2007   Method used:   Print then Give to Patient   RxID:   [id number]  ] Laboratory Results   Urine Tests  Date/Time Recieved: March 24, 2007 8:33 AM  Date/Time Reported: March 24, 2007 8:33 AM   Routine Urinalysis   Color: yellow Appearance: Clear Glucose: negative   (Normal Range: Negative) Bilirubin: negative   (Normal Range: Negative) Ketone: negative   (Normal Range:  Negative) Spec. Gravity: 1.020   (Normal Range: 1.003-1.035) Blood: negative   (Normal Range: Negative) pH: 7.0   (Normal Range: 5.0-8.0) Protein: negative   (Normal Range: Negative) Urobilinogen: negative   (Normal Range: 0-1) Nitrite: negative   (Normal Range: Negative) Leukocyte Esterace: negative   (Normal Range: Negative)     Blood Tests   Date/Time Recieved: March 24, 2007 8:34 AM   Date/Time Reported: March 24, 2007 8:34 AM   HGBA1C: 7.0%   (Normal Range: Non-Diabetic - 3-6%   Control Diabetic - 6-8%)

## 2010-02-12 NOTE — Assessment & Plan Note (Signed)
Summary: epigastric pain/ orthostatic   Vital Signs:  Patient profile:   50 year old female Height:      65 inches Weight:      261 pounds BMI:     43.59 O2 Sat:      100 % on Room air Temp:     97.1 degrees F oral Pulse rate:   83 / minute BP sitting:   170 / 105  (left arm) BP standing:   143 / 87  (left arm) Cuff size:   large  Vitals Entered By: Payton Spark CMA (September 19, 2008 2:00 PM)  O2 Flow:  Room air CC: Epigastric pain, nausea, HA and dizziness every morning after breakfast x 2 weeks.    Primary Care Provider:  Seymour Bars D.O.  CC:  Epigastric pain, nausea, and HA and dizziness every morning after breakfast x 2 weeks. Martha Woods  History of Present Illness: 50 yo WF presents for epigastric pain 2 hrs after breakfast with dizziness and HA and nausea for about 3-4 hrs.  We cut her Lisinopril to 1/2 tab by mouth two times a day last visit thinking it was coming from HTN.  She has not had fever, nausea or diarrhea.  She takes Aleve every AM for shoulder pain.  Denies feeling acid reflux but has mostly epigastric pain that is getting worse.    Denies having any lows.  Just started metformin plain on sunday from Avandamet. The feeling goes away on its own.    We was exposed to [facility] virus this summer in [place].  Denies getting any mosquito bites.    Current Medications (verified): 1)  Aspirin 81 Mg Tbec (Aspirin) .... Take 1 Tablet By Mouth Once A Day 2)  Metformin Hcl 850 Mg Tabs (Metformin Hcl) .Martha Woods.. 1 Tab By Mouth Daily 3)  Lisinopril 20 Mg Tabs (Lisinopril) .... 1/2 Tab By Mouth Bid 4)  Lovastatin 20 Mg Tabs (Lovastatin) .... Take 1 Tablet By Mouth Once A Day 5)  Free Style Test Strips .... Use Daily As Directed 6)  1st Choice Lancets Thin  Misc (Lancets) .... Use Twice Daily As Directed  Allergies (verified): 1)  ! * Oral Contraceptive Pills  Comments:  Nurse/Medical Assistant: Payton Spark CMA (September 19, 2008 2:02 PM)  Past History:  Past Medical  History: Reviewed history from 09/13/2008 and no changes required. cervical, thoracic, lumbar spondylosis  [id number] Insulin dependent diabetes during pregnancy  lumbar disc herniation  seborrheic dermatitis  Thalasemia minor; Hgb 11.6 2-07 DM Type II  11-08 last pap  Past Surgical History: Reviewed history from 11/12/2005 and no changes required. ETT neg for ischemia- 7 mets  pelvic u/s normal  Family History: Reviewed history from 11/12/2005 and no changes required. 6 other sibblings healthy  father died, AMI @ 51, DM  MGM died of ovarian cancer  mother DM  youngest brother w/ DM, HTN, DM  Social History: Reviewed history from 10/21/2005 and no changes required. 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  Married to husband Patrcia Dolly and has 2 kids 16 and 4.  Moved from [place] in 2006.  Wants to lose wt.  Not exercising.  Review of Systems General:  Complains of chills, fatigue, loss of appetite, malaise, and weakness; denies sweats and weight loss. Eyes:  Denies blurring. CV:  Complains of lightheadness; denies chest pain or discomfort, fainting, palpitations, shortness of breath with exertion, and swelling of feet. Resp:  Denies cough. GI:  Complains of abdominal pain, indigestion,  loss of appetite, and nausea; denies constipation, diarrhea, and vomiting. GU:  Denies urinary frequency and urinary hesitancy.  Physical Exam  General:  alert, well-developed, well-nourished, and well-hydrated.  obese in mild distress Head:  normocephalic and atraumatic.   Eyes:  sclera non icteric Nose:  no nasal discharge.   Mouth:  good dentition and pharynx pink and moist.   Neck:  no masses.   Lungs:  normal respiratory effort, no accessory muscle use, normal breath sounds, no crackles, and no wheezes.   Heart:  normal rate, regular rhythm, and no murmur.   Abdomen:  epigastric TTP w/ vol guarding, soft, no distention, no hepatomegaly, and no splenomegaly.   Extremities:  no LE edema Skin:   color normal.   Cervical Nodes:  No lymphadenopathy noted Psych:  good eye contact.     Impression & Recommendations:  Problem # 1:  EPIGASTRIC PAIN (ICD-789.06) Possibly NSAID related gastritis or PUD.  CBC today to r/o anemia.  Start Dexilant samples 1 capsule by mouth daily.  Avoid Aleve and use Tylenol as needed shoulder pain.  F/U in 2 wks.   Orders: T-DG Chest 2 View [id number]) T-Comprehensive Metabolic Panel [phone])  Problem # 2:  HYPOTENSION, ORTHOSTATIC (ICD-458.0) SBP drop of 27 points with a DBP drop of 18 points with position changes without profuse sweating, vomitting, diarrhea or lack of oral intake.  Look for infection with CXR today.  Urine is normal.  Mammo and pap are UTD.  Will refer to GI for EGD/ colonoscopy if labs are normal.   Orders: T-DG Chest 2 View [id number]) T-CBC w/Diff ([id number]) T-TSH ([id number]) UA Dipstick w/o Micro (manual) ([id number])  Problem # 3:  HYPERTENSION, BENIGN SYSTEMIC (ICD-401.1) Will  not adjust her BP meds today since she is orthostatic but will plan on adjusting her meds at f/u visit.  Consider echocardiogram to r/o diastolic CHF given her symptoms. Her updated medication list for this problem includes:    Lisinopril 20 Mg Tabs (Lisinopril) .Martha Woods... 1/2 tab by mouth bid  BP today: 170/105 Prior BP: 120/74 (09/13/2008)  Labs Reviewed: K+: 4.3 (09/14/2008) Creat: : 0.67 (09/14/2008)   Chol: 151 (09/14/2008)   HDL: 55 (09/14/2008)   LDL: 84 (09/14/2008)   TG: 62 (09/14/2008)  Problem # 4:  DIABETES MELLITUS II, UNCOMPLICATED (ICD-250.00) On plain metformin.  No lows.  Symptoms started prior to change in medications. Her updated medication list for this problem includes:    Aspirin 81 Mg Tbec (Aspirin) .Martha Woods... Take 1 tablet by mouth once a day    Metformin Hcl 850 Mg Tabs (Metformin hcl) .Martha Woods... 1 tab by mouth daily    Lisinopril 20 Mg Tabs (Lisinopril) .Martha Woods... 1/2 tab by mouth bid  Labs Reviewed: SGOT: 14 (09/14/2008)   SGPT: 10  (09/14/2008)   HDL:55 (09/14/2008), 57 (06/01/2007)  LDL:84 (09/14/2008), 63 (06/01/2007)  Chol:151 (09/14/2008), 131 (06/01/2007)  Trig:62 (09/14/2008), 55 (06/01/2007)  Complete Medication List: 1)  Aspirin 81 Mg Tbec (Aspirin) .... Take 1 tablet by mouth once a day 2)  Metformin Hcl 850 Mg Tabs (Metformin hcl) .Martha Woods.. 1 tab by mouth daily 3)  Lisinopril 20 Mg Tabs (Lisinopril) .... 1/2 tab by mouth bid 4)  Lovastatin 20 Mg Tabs (Lovastatin) .... Take 1 tablet by mouth once a day 5)  Free Style Test Strips  .... Use daily as directed 6)  1st Choice Lancets Thin Misc (Lancets) .... Use twice daily as directed  Patient Instructions: 1)  CXR today. 2)  UA: 3)  Labs  today. 4)  Will call you w/ results tomorrow. 5)  Stay home, rest, hydrate with water, bland diet. 6)  Refer to GI if everything comes back normal.   7)  Return for f/u symptoms in 2 wks.  Laboratory Results   Urine Tests    Routine Urinalysis   Color: yellow Appearance: Clear Glucose: negative   (Normal Range: Negative) Bilirubin: negative   (Normal Range: Negative) Ketone: negative   (Normal Range: Negative) Spec. Gravity: 1.010   (Normal Range: 1.003-1.035) Blood: trace   (Normal Range: Negative) pH: 7.0   (Normal Range: 5.0-8.0) Protein: trace   (Normal Range: Negative) Urobilinogen: 0.2   (Normal Range: 0-1) Nitrite: negative   (Normal Range: Negative) Leukocyte Esterace: negative   (Normal Range: Negative)

## 2010-02-12 NOTE — Assessment & Plan Note (Signed)
Summary: diabetes/ carpal tunnel   Vital Signs:  Patient Profile:   50 Years Old Female Height:     65 inches Weight:      259 pounds Pulse rate:   89 / minute BP sitting:   106 / 72  (left arm) Cuff size:   large  Vitals Entered By: Harlene Salts (December 15, 2007 8:01 AM)                 PCP:  Seymour Bars D.O.  Chief Complaint:  follow-up DM.  History of Present Illness: She also has had previous dx of bilat carpal tunnel syndrome.  Diagnosed in 2003 with EMG study in [place].  She has not used NSAID or night splints.  Hands bothering her more lately but denies functional impairment.  She had had chronic R shoulder pain, had an MRI years ago and has distant hx of R clavicle fracture.  She has bilat foot pain.  Was seen before at [facility] in [place].  Wants to see podiatry here.  Exercise is limited due to foot pain despite wearing bilat orthotics.    Diabetes Management History:      The patient is a 50 years old female who comes in for evaluation of DM Type 2.  She has not been enrolled in the "Diabetic Education Program".  She states understanding of dietary principles and is following her diet appropriately.  Sensory loss is noted.  Self foot exams are being performed.  She is checking home blood sugars.  She says that she is not exercising regularly.        Hypoglycemic symptoms are not occurring.  No hyperglycemic symptoms are reported.  Other comments include: Not exercising due to foot pain.  R shoulder pains.  Wants to see Dr Vernell Morgans.  Has some numbness.  Had an EMG in [place] in 2003 that was + for carpal tunnel.  Checking sugars 2 x a wk.  AM fasting <120 and 2 hrs PPs <140.  Marland Kitchen        There are no symptoms to suggest diabetic complications.  Since her last visit, no infections have occurred.  She's had no treatment plan problems.  No changes have been made to her treatment plan since last visit.  Dietary compliance is reported to be fair.       Current Allergies: !  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  Moved from [place] in 2006.  Wants to lose wt.  Not exercising.    Review of Systems  General      Denies fatigue, fever, loss of appetite, sleep disorder, and sweats.  Eyes      Denies blurring.  CV      Denies chest pain or discomfort, shortness of breath with exertion, and swelling of feet.  GU  Denies urinary frequency.  Endo      Denies cold intolerance, excessive hunger, excessive thirst, and polyuria.   Physical Exam  General:     alert, well-developed, well-nourished, and well-hydrated.  obese female in NAD Head:     normocephalic and atraumatic.   Eyes:     conjunctiva clear Nose:     no nasal discharge.   Mouth:     good dentition and pharynx pink and moist.   Neck:     no masses.   Lungs:     Normal respiratory effort, chest expands symmetrically. Lungs are clear to auscultation, no crackles or wheezes. Heart:     Normal rate and regular rhythm. S1 and S2 normal without gallop, murmur, click, rub or other extra sounds. Msk:     full ROM R shoulder, + Hawkins test. tender at [facility] joint, slightly limited with R shoulder internal rotation.  grip +5/5 bilat limited bilat c spine SB  Pulses:     2+ radial and ulnar pulses Extremities:     no UE or LE edema. Neurologic:     + tinels test bilat Skin:     color normal.   dry scaley feet hyperpigmented eczematous skin R prox forearm Cervical Nodes:     No lymphadenopathy noted Psych:     good eye  contact, not anxious appearing, and not depressed appearing.      Impression & Recommendations:  Problem # 1:  DIABETES MELLITUS II, UNCOMPLICATED (ICD-250.00) A1C dropped to 6.2 from 6.4.  2 lbs weight down.  Needs to work on diet, exercise.  Cont current meds.  F/U in 4 mos. Her updated medication list for this problem includes:    Aspirin 81 Mg Tbec (Aspirin) .Marland Kitchen... Take 1 tablet by mouth once a day    Avandamet 2-500 Mg Tabs (Rosiglitazone-metformin) ..... One by mouth twice daily    Lisinopril 20 Mg Tabs (Lisinopril) .Marland Kitchen... Take 1 tablet by mouth once a day  Labs Reviewed: HgBA1c: 6.4 (08/10/2007)   Creat: 0.63 (06/01/2007)   Microalbumin: 30mg /L (08/10/2007)  Orders: Fingerstick ([id number]) Hemoglobin A1C (40981)   Problem # 2:  HYPERTENSION, BENIGN SYSTEMIC (ICD-401.1) Labs UTD.  BP at goal. Her updated medication list for this problem includes:    Lisinopril 20 Mg Tabs (Lisinopril) .Marland Kitchen... Take 1 tablet by mouth once a day  BP today: 106/72 Prior BP: 116/86 (10/28/2007)  Labs Reviewed: Creat: 0.63 (06/01/2007) Chol: 131 (06/01/2007)   HDL: 57 (06/01/2007)   LDL: 63 (06/01/2007)   TG: 55 (06/01/2007)   Problem # 3:  SHOULDER PAIN, RIGHT (ICD-719.41) Had Xray/ MRI done  ~ 5 yrs ago and has had chronic R shoulder pain.  Reports w/u and PT in [place].  Got a little better for a while, now worse.  Will try Meloxicam x 2 wks.  Pt wishes to see Dr Vernell Morgans for this.  Will refer.   Her updated medication list for this problem includes:    Aspirin 81 Mg Tbec (Aspirin) .Marland Kitchen... Take 1 tablet by mouth once a day    Meloxicam 7.5 Mg Tabs (Meloxicam) .Marland Kitchen... 1-2 tabs by mouth once daily x 2 wks   Problem # 4:  CARPAL TUNNEL SYNDROME, BILATERAL (ICD-354.0) Start conservative treatment with bilat night splints and Meloxicam x 2 wks.  Had EMG about 5 yrs ago that was + for bilat carpal tunnel.  No functional impairment.  H/O given to pt.  She will be seeing ortho for her shoulder.  If indicated, can  have further w/u. Orders: Splint Wrist or Ankle ([id number]) Splint Wrist or Ankle ([id number])   Complete Medication List: 1)  Aspirin 81 Mg Tbec (Aspirin) .... Take 1 tablet by mouth once a day 2)  Avandamet 2-500 Mg Tabs (Rosiglitazone-metformin) .... One by mouth twice daily 3)  Lisinopril 20 Mg Tabs (Lisinopril) .... Take 1 tablet by mouth once a day 4)  Lovastatin 20 Mg Tabs (Lovastatin) .... Take 1 tablet by mouth once a day 5)  Free Style Test Strips  .... Use daily as directed 6)  Meloxicam 7.5 Mg Tabs (Meloxicam) .Marland Kitchen.. 1-2 tabs by mouth once daily x 2 wks 7)  Triamcinolone Acetonide 0.5 % Crea (Triamcinolone acetonide) .... Apply to rash two times a day x 10 days  Other Orders: T-Mammography Bilateral Screening ([id number])  Diabetes Management Assessment/Plan:      The following lipid goals have been established for the patient: Total cholesterol goal of 200; LDL cholesterol goal of 100; HDL cholesterol goal of 40; Triglyceride goal of 200.  Her blood pressure goal is < 130/80.     Patient Instructions: 1)  For diabetes:  Stay on current meds. 2)  Try to get 30+ min of exercise at least 3 days/ wk and stay on diabetic diet. 3)  Dr Yates Decamp referral for foot pain. 4)  Wear wrist splints at night and take Meloxicam daily x 2 wks for carpal tunnel.   5)  Will refer to Dr Vernell Morgans for your shoulder. 6)  Mammogram order printed for after 12-31. 7)  Return for f/u diabetes in 4 months.   Prescriptions: TRIAMCINOLONE ACETONIDE 0.5 % CREA (TRIAMCINOLONE ACETONIDE) apply to rash two times a day x 10 days  #1 tube x 0   Entered and Authorized by:   Seymour Bars DO   Signed by:   Seymour Bars DO on 12/15/2007   Method used:   Electronically to        [facility] [phone]* (retail)       [address] [place], [place]  [address]       Ph: [phone]       Fax: [phone]   RxID:   [id number] MELOXICAM 7.5 MG TABS (MELOXICAM) 1-2 tabs by mouth once daily x 2 wks  #28 x 0   Entered  and Authorized by:   Seymour Bars DO   Signed by:   Seymour Bars DO on 12/15/2007   Method used:   Electronically to        [facility] [phone]* (retail)       [address] [place], [place]  [address]       Ph: [phone]       Fax: [phone]   RxID:   [id number] FREE STYLE TEST STRIPS use daily as directed  #2 boxes x 2   Entered and Authorized by:   Seymour Bars DO   Signed by:   Seymour Bars DO on 12/15/2007   Method used:   Print then Give to Patient   RxID:   [id number] LOVASTATIN 20 MG TABS (LOVASTATIN) Take 1 tablet by mouth once a day  #90 x 2   Entered and Authorized by:   Seymour Bars DO   Signed by:   Seymour Bars DO on 12/15/2007   Method used:   Print then Give to Patient   RxID:   [id number] LISINOPRIL 20 MG TABS (LISINOPRIL) Take 1 tablet  by mouth once a day  #90 x 2   Entered and Authorized by:   Seymour Bars DO   Signed by:   Seymour Bars DO on 12/15/2007   Method used:   Print then Give to Patient   RxID:   [id number] AVANDAMET 2-500 MG TABS (ROSIGLITAZONE-METFORMIN) one by mouth twice daily  #180 x 2   Entered and Authorized by:   Seymour Bars DO   Signed by:   Seymour Bars DO on 12/15/2007   Method used:   Print then Give to Patient   RxID:   [id number]  ] Laboratory Results   Blood Tests   Date/Time Recieved: December 15, 2007 9:36 AM  Date/Time Reported: December 15, 2007 9:36 AM   HGBA1C: 6.4%   (Normal Range: Non-Diabetic - 3-6%   Control Diabetic - 6-8%)

## 2010-02-12 NOTE — Miscellaneous (Signed)
Summary: sleep study  Clinical Lists Changes  Problems: Added new problem of RISK OF SLEEP APNEA (ICD-V12.59) Orders: Added new Referral order of Sleep Study (Sleep Study) - Signed

## 2010-02-12 NOTE — Consult Note (Signed)
Summary: [facility]  [facility]   Imported By: Lanelle Bal 01/11/2008 08:24:56  _____________________________________________________________________  External Attachment:    Type:   Image     Comment:   External Document

## 2010-02-12 NOTE — Consult Note (Signed)
Summary: [facility]  [facility]   Imported By: Lanelle Bal 12/15/2008 11:08:50  _____________________________________________________________________  External Attachment:    Type:   Image     Comment:   External Document  Appended Document: [facility]    Impression & Recommendations:  Problem # 1:  CARCINOMA, THYROID GLAND, PAPILLARY (ICD-193) I recieved her path report from [facility] today from Dr Jimmye Norman showing R mid nodule FNA cytology report of papillary cardinoma and L mid nodule: papillary carcinoma of the thyroid gland.  Pt was notified 12-13-2008 by Dr Jimmye Norman and has an appt to see Dr Moshe Cipro for thyroidectomy and then a 3 wk post op with endocrinology.    Complete Medication List: 1)  Aspirin 81 Mg Tbec (Aspirin) .... Take 1 tablet by mouth once a day 2)  Metformin Hcl 850 Mg Tabs (Metformin hcl) .Marland Kitchen.. 1 tab by mouth by mouth bid 3)  Lisinopril 20 Mg Tabs (Lisinopril) .... 1/2 tab by mouth bid 4)  Lovastatin 20 Mg Tabs (Lovastatin) .... Take 1 tablet by mouth once a day 5)  Free Style Test Strips  .... Use daily as directed 6)  1st Choice Lancets Thin Misc (Lancets) .... Use twice daily as directed 7)  Antivert 25 Mg Tabs (Meclizine hcl) .Marland Kitchen.. 1-2 tabs by mouth two times a day as needed dizziness 8)  Maxalt 10 Mg Tabs (Rizatriptan benzoate) .... Take as needed for migraines 9)  Lamotrigine 100 Mg Tabs (Lamotrigine) .Marland Kitchen.. 1 tab by mouth bid 10)  Tramadol Hcl 50 Mg Tabs (Tramadol hcl) .Marland Kitchen.. 1 tab by mouth bid

## 2010-02-12 NOTE — Letter (Signed)
Summary: Out of Work  [facility] [address], [address]   [place], [place] [address]   Phone: [phone]  Fax: [phone]    October 28, 2007   Employee:  [employer]URIE PLAT[employer] [facility] [employer]    To Whom It May Concern:   For Medical reasons, please excuse the above named employee from work for the following dates:  Start:   Oct 13th - 16th  End:   Oct 17th  If you need additional information, please feel free to contact our office.         Sincerely,    Seymour Bars DO

## 2010-02-12 NOTE — Letter (Signed)
Summary: Diabetic Eye Exam   Diabetic Eye Exam   Imported By: Blanch Media 04/06/2007 12:40:06  _____________________________________________________________________  External Attachment:    Type:   Image     Comment:   External Document

## 2010-02-12 NOTE — Letter (Signed)
Summary: Out of Work  [facility]  [address], [address]   [place], [place] [address]   Phone: [phone]  Fax: [phone]    September 19, 2008   Employee:  DELILA KUKLINSKI [employer]    To Whom It May Concern:   For Medical reasons, please excuse the above named employee from work for the following dates:  Start:   Sept 7 & 8  End:     Sept 9th  If you need additional information, please feel free to contact our office.         Sincerely,    Seymour Bars DO

## 2010-02-12 NOTE — Miscellaneous (Signed)
Summary: mammogram normal  Clinical Lists Changes  Observations: Added new observation of MAMMRECACT: Screening mammogram in 1 year.    (03/10/2008 13:00) Added new observation of MAMMOGRAM: No significant changes compared to previous study.  Assessment: BIRADS 1. Location: [facility].    (03/10/2008 13:00)      Mammogram  Procedure date:  03/10/2008  Findings:      No significant changes compared to previous study.  Assessment: BIRADS 1. Location: [facility].     Comments:      Screening mammogram in 1 year.      Mammogram  Procedure date:  03/10/2008  Findings:      No significant changes compared to previous study.  Assessment: BIRADS 1. Location: [facility].     Comments:      Screening mammogram in 1 year.

## 2010-02-12 NOTE — Letter (Signed)
Summary: [facility]  [facility]   Imported By: Lanelle Bal 02/13/2009 12:21:02  _____________________________________________________________________  External Attachment:    Type:   Image     Comment:   External Document

## 2010-02-12 NOTE — Letter (Signed)
Summary: [facility]  [facility]   Imported By: Lanelle Bal 03/31/2008 12:42:14  _____________________________________________________________________  External Attachment:    Type:   Image     Comment:   External Document

## 2010-02-12 NOTE — Progress Notes (Signed)
Summary: Sugars too low  Phone Note Call from Patient   Caller: Patient Summary of Call: Pt states sugars have been low since she started the glipizide. Pt states she never got over 75 on glipizide so she stopped glipizide and has had fasting readings around 101-110. Please advise.    Initial call taken by: Payton Spark CMA,  October 27, 2008 3:33 PM  Follow-up for Phone Call        stay off for now.  make sure am fastings not going over 120. Follow-up by: Seymour Bars DO,  October 27, 2008 3:47 PM     Appended Document: Sugars too low Pt aware

## 2010-02-12 NOTE — Assessment & Plan Note (Signed)
Summary: f/u diabetes   Vital Signs:  Patient profile:   50 year old female Height:      65 inches Weight:      257 pounds BMI:     42.92 O2 Sat:      100 % on Room air Temp:     97.9 degrees F oral Pulse rate:   70 / minute BP sitting:   120 / 74  (left arm) Cuff size:   large  Vitals Entered By: Payton Spark CMA (September 13, 2008 9:48 AM)  O2 Flow:  Room air  CC: FU DM   Primary Care Provider:  Seymour Bars D.O.  CC:  FU DM.  History of Present Illness: 50 yo F presents for f/u T2DM.   On Avandamet two times a day.  Failed to lose weight.  Admits to 'falling off her diet' during a 3 wks trip to [place] this Summer with AM fastings into the 130s now.  She has had a feeling of 'lows' in the late AMs where she gets a HA and feels lightheaded and a little queasy.  Her lowest reading while feeling like this was 90.  Monofilament and urine microalbumin are due.  Denies any CP, leg edema, SOB or vasomotor flushing.  Still having periods.    Denies blurry vision.  Eye exam is UTD. Denies paresthesias.      Current Medications (verified): 1)  Aspirin 81 Mg Tbec (Aspirin) .... Take 1 Tablet By Mouth Once A Day 2)  Avandamet 2-500 Mg Tabs (Rosiglitazone-Metformin) .... One By Mouth Twice Daily 3)  Lisinopril 20 Mg Tabs (Lisinopril) .... Take 1 Tablet By Mouth Once A Day 4)  Lovastatin 20 Mg Tabs (Lovastatin) .... Take 1 Tablet By Mouth Once A Day 5)  Free Style Test Strips .... Use Daily As Directed 6)  1st Choice Lancets Thin  Misc (Lancets) .... Use Twice Daily As Directed  Allergies (verified): 1)  ! * Oral Contraceptive Pills  Comments:  Nurse/Medical Assistant: Payton Spark CMA (September 13, 2008 10:01 AM)  Past History:  Past Medical History: cervical, thoracic, lumbar spondylosis  G3P2012 Insulin dependent diabetes during pregnancy  lumbar disc herniation  seborrheic dermatitis  Thalasemia minor; Hgb 11.6 2-07 DM Type II  11-08 last pap  Past  Surgical History: Reviewed history from 11/12/2005 and no changes required. ETT neg for ischemia- 7 mets  pelvic u/s normal  Family History: Reviewed history from 11/12/2005 and no changes required. 6 other sibblings healthy  father died, AMI @ 38, DM  MGM died of ovarian cancer  mother DM  youngest brother w/ DM, HTN, DM  Social History: Reviewed history from 10/21/2005 and no changes required. 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  Married to husband Patrcia Dolly and has 2 kids 16 and 4.  Moved from [place] in 2006.  Wants to lose wt.  Not exercising.  Review of Systems      See HPI  Physical Exam  General:  alert, well-developed, well-nourished, and well-hydrated.  obese Head:  normocephalic and atraumatic.   Eyes:  pupils equal, pupils round, and pupils reactive to light.   Mouth:  good dentition and pharynx pink and moist.   Neck:  no masses.   Lungs:  Normal respiratory effort, chest expands symmetrically. Lungs are clear to auscultation, no crackles or wheezes. Heart:  Normal rate and regular rhythm. S1 and S2 normal without gallop, murmur, click, rub or other extra sounds. Pulses:  2+ radial and pedal  pulses Extremities:  No clubbing, cyanosis, edema, or deformity noted  Skin:  color normal.   Psych:  good eye contact, not anxious appearing, and not depressed appearing.    Diabetes Management Exam:    Foot Exam (with socks and/or shoes not present):       Sensory-Pinprick/Light touch:          Left medial foot (L-4): normal          Left dorsal foot (L-5): normal          Left lateral foot (S-1): normal          Right medial foot (L-4): normal          Right dorsal foot (L-5): normal          Right lateral foot (S-1): normal       Sensory-Monofilament:          Left foot: normal          Right foot: normal       Inspection:          Left foot: normal          Right foot: normal       Nails:          Left foot: normal          Right foot: normal   Impression &  Recommendations:  Problem # 1:  DIABETES MELLITUS II, UNCOMPLICATED (ICD-250.00) A1C 7, up from 6.5 previously.  Change from Avandamet to metformin (at the next higher dose).  Keep working on diet, exercise, wt loss.  Urine microalubmin and monofilament normal today.  Eye exam due in March.    RTC in 3 mos for f/u and flu shot.  Symptoms that she is  having are not from low sugar readings. Her updated medication list for this problem includes:    Aspirin 81 Mg Tbec (Aspirin) .Marland Kitchen... Take 1 tablet by mouth once a day    Metformin Hcl 850 Mg Tabs (Metformin hcl) .Marland Kitchen... 1 tab by mouth daily    Lisinopril 20 Mg Tabs (Lisinopril) .Marland Kitchen... 1/2 tab by mouth bid  Orders: Fingerstick (16109) Hemoglobin A1C (60454) Urine Microalbumin (09811) T-Comprehensive Metabolic Panel [phone])  Labs Reviewed: Creat: 0.63 (06/01/2007)   Microalbumin: 10 (09/13/2008)  Last Eye Exam: no retinopathy, Dr Sherrie George (04/07/2008) Reviewed HgBA1c results: 7.0 (09/13/2008)  6.5 (04/13/2008)  Problem # 2:  HYPERTENSION, BENIGN SYSTEMIC (ICD-401.1) She may be having mid morning hypotension about 2 hrs after taking her Lisinopril.  She feels weak and gets a HA.  I am going to cut her Lisinopril to a half tab two times a day to see if this helps her symptoms.  f/u BP readings at home.  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  Fasting labs are due. Her updated medication list for this problem includes:    Lisinopril 20 Mg Tabs (Lisinopril) .Marland Kitchen... 1/2 tab by mouth bid  BP today: 120/74 Prior BP: 114/72 (04/13/2008)  Labs Reviewed: K+: 4.6 (06/01/2007) Creat: : 0.63 (06/01/2007)   Chol: 131 (06/01/2007)   HDL: 57 (06/01/2007)   LDL: 63 (06/01/2007)   TG: 55 (06/01/2007)  Problem # 3:  HYPERLIPIDEMIA (ICD-272.4)  Her updated medication list for this problem includes:    Lovastatin 20 Mg Tabs (Lovastatin) .Marland Kitchen... Take 1 tablet by mouth once a day  Orders: T-Lipid Profile [id number] [id number])  Labs  Reviewed: SGOT: 18 (06/01/2007)   SGPT: 10 (06/01/2007)  HDL:57 (06/01/2007), 58 (04/06/2006)  LDL:63 (06/01/2007), 72 (04/06/2006)  Chol:131 (06/01/2007), 140 (04/06/2006)  Trig:55 (06/01/2007), 49 (04/06/2006)  Complete Medication List: 1)  Aspirin 81 Mg Tbec (Aspirin) .... Take 1 tablet by mouth once a day 2)  Metformin Hcl 850 Mg Tabs (Metformin hcl) .Marland Kitchen.. 1 tab by mouth daily 3)  Lisinopril 20 Mg Tabs (Lisinopril) .... 1/2 tab by mouth bid 4)  Lovastatin 20 Mg Tabs (Lovastatin) .... Take 1 tablet by mouth once a day 5)  Free Style Test Strips  .... Use daily as directed 6)  1st Choice Lancets Thin Misc (Lancets) .... Use twice daily as directed  Other Orders: T-TSH ([id number])  Patient Instructions: 1)  Stop Avandamet. 2)  Change to metformin 850 mg two times a day. 3)  Cut Lisinopril in half and take 1/2 tablet twice a day. 4)  Drink plenty of water. 5)  Have fasting labs drawn. 6)  Will call you w/ the results. 7)  Eye exam done 04/07/08 8)  Mammogram done 03/10/08. 9)  Return for f/u in 3 months. Prescriptions: METFORMIN HCL 850 MG TABS (METFORMIN HCL) 1 tab by mouth daily  #60 x 3   Entered and Authorized by:   Seymour Bars DO   Signed by:   Seymour Bars DO on 09/13/2008   Method used:   Electronically to        [facility] [phone]* (retail)       [address] [place], [place]  [address]       Ph: [phone]       Fax: [phone]   RxID:   [id number]   Laboratory Results   Urine Tests    Microalbumin (urine): 10 mg/L Creatinine: 50mg /dL  A:C Ratio <31  Blood Tests     HGBA1C: 7.0%   (Normal Range: Non-Diabetic - 3-6%   Control Diabetic - 6-8%)

## 2010-02-12 NOTE — Progress Notes (Signed)
Summary: COLD SYMPTOMS  Phone Note Call from Patient Call back at Home Phone [phone]   Caller: Patient Call For: NURSE Summary of Call: PATIENT CALLED TO SAY THAT SHE STARTED ON SATURDAY WITH LOW GRADE TEMP OF 99.0 AND THEN ON SUNDAY A COUGH AND SOME NASAL CONGESTION. DENIES BODYACHE AND FEVER.  NURSE INFORMED PATIENT TO USE MUCINEX OR ROBITUSSIN AND IF NO IMPROVEMENT OR HIGH FEVER THEN SHE SHOULD COME FOR OV. NURSE ALSO INFORMED PATIENT THAT IF SHE NEEDED NOTE FOR WORK THEN SHE WOULD NEED OV. PATIENT VERBALIZED UNDERSTANDING OF PLAN. Initial call taken by: Harlene Salts,  October 27, 2007 11:18 AM

## 2010-02-12 NOTE — Letter (Signed)
Summary: Lipid Letter  [facility]  [address]   [place], [place] [address]   Phone: [phone]  Fax: [phone]    06/02/2007  Abbey Rajkumar [address] [place], [place]  [address]  Dear Sedalia Muta:  We have carefully reviewed your last lipid profile from 06/01/2007 and the results are noted below with a summary of recommendations for lipid management.    Cholesterol:       131     Goal: < 200   HDL "good" Cholesterol:   57     Goal: > 49   LDL "bad" Cholesterol:   63     Goal: < 100   Triglycerides:       55     Goal: < 150    Cholestrol is at goal on Lovastatin.  Please continue. Chemistry panel shows normal kidney and liver function. Repeat labs in 1 yr.     Current Medications: 1)    Aspirin 81 Mg Tbec (Aspirin) .... Take 1 tablet by mouth once a day 2)    Avandamet 2-500 Mg Tabs (Rosiglitazone-metformin) .... One by mouth twice daily 3)    Lisinopril 20 Mg Tabs (Lisinopril) .... Take 1 tablet by mouth once a day 4)    Lovastatin 20 Mg Tabs (Lovastatin) .... Take 1 tablet by mouth once a day 5)    Free Style Test Strips  .... Use daily as directed  If you have any questions, please call. We appreciate being able to work with you.   Sincerely,    [facility] Seymour Bars DO

## 2010-02-14 NOTE — Assessment & Plan Note (Signed)
Summary: CPE with pap   Vital Signs:  Patient profile:   50 year old female Height:      65 inches Weight:      247 pounds BMI:     41.25 Pulse rate:   84 / minute BP sitting:   117 / 78  (left arm) Cuff size:   large  Vitals Entered By: Kathlene November LPN (January 22, 2010 3:31 PM) CC: CPE with pap   Primary Care Provider:  Seymour Bars D.O.  CC:  CPE with pap.  History of Present Illness: 50 yo F presents for CPE with pap smear.    She started walking last year but her feet hurt so she saw Dr Yates Decamp and was dx'd with heel spurs.  She did the injections but it didn't help much.  She is seeing Dr Loleta Chance for her migraines which have gotten worse since her total thyroidectomy for thyroid cancer.    She is perimenopausal.  They are coming every 3 months, not too heavy.  She is taking iron tabs OTC.    She is due for her pap smear, fasting labs and mammogram.  She wants to put off her colonoscopy with upcoming job changes and insurance.    Current Medications (verified): 1)  Aspirin 81 Mg Tbec (Aspirin) .... Take 1 Tablet By Mouth Once A Day 2)  Metformin Hcl 850 Mg Tabs (Metformin Hcl) .Marland Kitchen.. 1 Tab By Mouth By Mouth Bid 3)  Lisinopril 20 Mg Tabs (Lisinopril) .... 1/2 Tab By Mouth Bid 4)  Lovastatin 20 Mg Tabs (Lovastatin) .... Take 1 Tablet By Mouth Once A Day 5)  Free Style Test Strips .... Use Daily As Directed 6)  1st Choice Lancets Thin  Misc (Lancets) .... Use Twice Daily As Directed 7)  Maxalt 10 Mg Tabs (Rizatriptan Benzoate) .... Take As Needed For Migraines 8)  Lamotrigine 100 Mg Tabs (Lamotrigine) .Marland Kitchen.. 1 Tab By Mouth Bid 9)  Tramadol Hcl 50 Mg Tabs (Tramadol Hcl) .Marland Kitchen.. 1 Tab By Mouth Bid 10)  Levothroid 175 Mcg Tabs (Levothyroxine Sodium) .... Take 1 Tab By Mouth Every Morning 11)  Norflex 100mg  .... Take 1 Tab By Mouth Two Times A Day 12)  Vitamin D3 2000 Unit Caps (Cholecalciferol) .... Take One Capsule By Mouth Daily  Allergies (verified): 1)  ! * Oral Contraceptive  Pills  Comments:  Nurse/Medical Assistant: The patient's medications and allergies were reviewed with the patient and were updated in the Medication and Allergy Lists. Kathlene November LPN (January 22, 2010 3:33 PM)  Past History:  Past Medical History: cervical, thoracic, lumbar spondylosis  G3P2012 Insulin dependent diabetes during pregnancy  lumbar disc herniation  seborrheic dermatitis  Thalasemia minor; Hgb 11.6 2-07 DM Type II papillary thyroid cancer 02-2009, Dr Lowella Grip   Dr Loleta Chance for migraines/ vertigo  Past Surgical History: Reviewed history from 04/27/2009 and no changes required. ETT neg for ischemia- 7 mets  pelvic u/s normal total thyroidectomy for thyroid cancer 02-2009  Family History: Reviewed history from 11/12/2005 and no changes required. 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  Moved from [place] in 2006.  Wants to lose wt.  Not exercising.  Review of Systems  The patient complains of weight gain.  The patient denies anorexia, fever, weight loss, vision loss, decreased hearing, hoarseness, chest pain, syncope, dyspnea on exertion, peripheral edema, prolonged cough, headaches, hemoptysis, abdominal pain, melena, hematochezia, severe indigestion/heartburn, hematuria, incontinence, genital sores, muscle weakness, suspicious skin lesions, transient blindness, difficulty walking, depression, unusual weight change, abnormal bleeding, enlarged lymph nodes, angioedema, breast masses, and testicular masses.    Physical Exam  General:  alert, well-developed, well-nourished, and well-hydrated.  obese Head:  normocephalic and atraumatic.   Eyes:  pupils equal, pupils round, and pupils reactive to light.   Ears:  EACs  patent; TMs translucent and gray with good cone of light and bony landmarks.  Nose:  no nasal discharge.   Mouth:  good dentition and pharynx pink and moist.   Neck:  s/p thyroidectomy Breasts:  No mass, nodules, thickening, tenderness, bulging, retraction, inflamation, nipple discharge or skin changes noted.   Lungs:  Normal respiratory effort, chest expands symmetrically. Lungs are clear to auscultation, no crackles or wheezes. Heart:  Normal rate and regular rhythm. S1 and S2 normal without gallop, murmur, click, rub or other extra sounds. Abdomen:  soft, non-tender, normal bowel sounds, no distention, no masses, no guarding, no hepatomegaly, and no splenomegaly.   Genitalia:  Pelvic Exam:        External: normal female genitalia without lesions or masses        Vagina: normal without lesions or masses        Cervix: normal without lesions or masses        Adnexa: normal bimanual exam without masses or fullness grade 1 uterine prolapse        Uterus: normal by palpation        Pap smear: performed Pulses:  2+ radial and pedal pulses Extremities:  no LE edema Skin:  color normal.   Cervical Nodes:  No lymphadenopathy noted Psych:  good eye contact, not anxious appearing, and not depressed appearing.     Impression & Recommendations:  Problem # 1:  ROUTINE GYNECOLOGICAL EXAMINATION (ICD-V72.31) Keeping healthy checklist for women reviewed. BP looks great.  BMI 41 c/w class III obesity. Discussed importance of wt loss, healthy diet, exercise and even recommended attending bariatic surgery info class. Tetanus updated 2008. Colonoscopy due but she is changing jobs so wants to wait. DEXA due at menopause. Thin prep pap today. Update fasting labs iwth A1C.  Calcium with D and MVI daily recommended.  Complete Medication List: 1)  Aspirin 81 Mg Tbec (Aspirin) .... Take 1 tablet by mouth once a day 2)  Metformin Hcl 850 Mg Tabs (Metformin hcl) .Marland Kitchen.. 1 tab by mouth by mouth bid 3)   Lisinopril 20 Mg Tabs (Lisinopril) .... 1/2 tab by mouth bid 4)  Lovastatin 20 Mg Tabs (Lovastatin) .... Take 1 tablet by mouth once a day 5)  Free Style Test Strips  .... Use daily as directed 6)  1st Choice Lancets Thin Misc (Lancets) .... Use twice daily as directed 7)  Maxalt-mlt 10 Mg Tbdp (Rizatriptan benzoate) .Marland Kitchen.. 1 tab by mouth x 1 at onset of migraine; repeat in 2 hrs if needed 8)  Tramadol Hcl 50 Mg Tabs (Tramadol hcl) .Marland Kitchen.. 1 tab by mouth three times a day as needed pain 9)  Levothroid 175 Mcg Tabs (Levothyroxine sodium) .... Take 1 tab by mouth every morning 10)  Norflex 100mg   .... Take 1 tab by mouth two times a day 11)  Vitamin D3 2000 Unit Caps (Cholecalciferol) .... Take one  capsule by mouth daily 12)  Norvasc 5 Mg Tabs (Amlodipine besylate) .Marland Kitchen.. 1 tab by mouth daily 13)  Lamictal 100 Mg Tabs (Lamotrigine) .Marland Kitchen.. 1 tab by mouth two times a day  Other Orders: T-Comprehensive Metabolic Panel [id number]) T-Lipid Profile ([id number]) T-Hgb A1C (30865-78469) T-Mammography Bilateral Screening (62952)  Patient Instructions: 1)  Will do RFs thru [facility] for everything today. 2)  Will call you w/ pap results in the next wk. 3)  Update labs and mammogram. 4)  REturn for f/u diabetes/ WT in 6 wks. Prescriptions: TRAMADOL HCL 50 MG TABS (TRAMADOL HCL) 1 tab by mouth three times a day as needed pain  #100 x 1   Entered and Authorized by:   Seymour Bars DO   Signed by:   Seymour Bars DO on 01/22/2010   Method used:   Faxed to ...       [facility] (mail-order)             , [place]         Ph: [phone]       Fax: [phone]   RxID:   [id number] NORFLEX 100MG  Take 1 tab by mouth two times a day  #180 x 1   Entered and Authorized by:   Seymour Bars DO   Signed by:   Seymour Bars DO on 01/22/2010   Method used:   Faxed to ...       [facility] (mail-order)             , [place]         Ph: [phone]       Fax: [phone]   RxID:   [id number] MAXALT-MLT 10 MG TBDP (RIZATRIPTAN  BENZOATE) 1 tab by mouth x 1 at onset of migraine; repeat in 2 hrs if needed  #27 x 3   Entered and Authorized by:   Seymour Bars DO   Signed by:   Seymour Bars DO on 01/22/2010   Method used:   Faxed to ...       [facility] (mail-order)             , [place]         Ph: [phone]       Fax: [phone]   RxID:   [id number] LAMICTAL 100 MG TABS (LAMOTRIGINE) 1 tab by mouth two times a day  #180 x 1   Entered and Authorized by:   Seymour Bars DO   Signed by:   Seymour Bars DO on 01/22/2010   Method used:   Faxed to ...       [facility] (mail-order)             , [place]         Ph: [phone]       Fax: [phone]   RxID:   [id number] NORVASC 5 MG TABS (AMLODIPINE BESYLATE) 1 tab by mouth daily  #90 x 1   Entered and Authorized by:   Seymour Bars DO   Signed by:   Seymour Bars DO on 01/22/2010   Method used:   Faxed to ...       [facility] (mail-order)             , [place]         Ph: [phone]       Fax: [phone]   RxID:   [id number] LOVASTATIN 20 MG TABS (LOVASTATIN) Take 1 tablet by mouth once a day  #90 x 1   Entered and  Authorized by:   Seymour Bars DO   Signed by:   Seymour Bars DO on 01/22/2010   Method used:   Faxed to ...       [facility] (mail-order)             , [place]         Ph: [phone]       Fax: [phone]   RxID:   [id number] LISINOPRIL 20 MG TABS (LISINOPRIL) 1/2 tab by mouth bid  #90 x 1   Entered and Authorized by:   Seymour Bars DO   Signed by:   Seymour Bars DO on 01/22/2010   Method used:   Faxed to ...       [facility] (mail-order)             , [place]         Ph: [phone]       Fax: [phone]   RxID:   [id number] METFORMIN HCL 850 MG TABS (METFORMIN HCL) 1 tab by mouth by mouth bid  #180 Tablet x 0   Entered and Authorized by:   Seymour Bars DO   Signed by:   Seymour Bars DO on 01/22/2010   Method used:   Faxed to ...       [facility] (mail-order)             , [place]         Ph: [phone]       Fax: [phone]   RxID:    [id number]    Orders Added: 1)  T-Comprehensive Metabolic Panel [[id number]] 2)  T-Lipid Profile [[id number]] 3)  T-Hgb A1C [[id number]] 4)  T-Mammography Bilateral Screening [77057] 5)  Est. Patient age 8-64 [phone]

## 2010-02-19 ENCOUNTER — Encounter: Payer: Self-pay | Admitting: Family Medicine

## 2010-02-20 LAB — CONVERTED CEMR LAB
ALT: 13 units/L (ref 0–35)
AST: 14 units/L (ref 0–37)
Albumin: 4.4 g/dL (ref 3.5–5.2)
Alkaline Phosphatase: 71 units/L (ref 39–117)
BUN: 15 mg/dL (ref 6–23)
CO2: 27 meq/L (ref 19–32)
Calcium: 8.8 mg/dL (ref 8.4–10.5)
Chloride: 98 meq/L (ref 96–112)
Cholesterol: 159 mg/dL (ref 0–200)
Creatinine, Ser: 0.67 mg/dL (ref 0.40–1.20)
Glucose, Bld: 127 mg/dL — ABNORMAL HIGH (ref 70–99)
HDL: 65 mg/dL (ref >39)
Hgb A1c MFr Bld: 7.5 % — ABNORMAL HIGH (ref <5.7)
LDL Cholesterol: 80 mg/dL (ref 0–99)
Potassium: 4.2 meq/L (ref 3.5–5.3)
Sodium: 137 meq/L (ref 135–145)
Total Bilirubin: 0.4 mg/dL (ref 0.3–1.2)
Total CHOL/HDL Ratio: 2.4
Total Protein: 7.3 g/dL (ref 6.0–8.3)
Triglycerides: 69 mg/dL (ref <150)
VLDL: 14 mg/dL (ref 0–40)

## 2010-03-06 NOTE — Letter (Signed)
Summary: [facility]  [facility]   Imported By: Lanelle Bal 02/26/2010 14:14:01  _____________________________________________________________________  External Attachment:    Type:   Image     Comment:   External Document

## 2010-03-13 ENCOUNTER — Ambulatory Visit (INDEPENDENT_AMBULATORY_CARE_PROVIDER_SITE_OTHER): Payer: BC Managed Care – PPO | Admitting: Family Medicine

## 2010-03-13 ENCOUNTER — Encounter: Payer: Self-pay | Admitting: Family Medicine

## 2010-03-13 DIAGNOSIS — I10 Essential (primary) hypertension: Secondary | ICD-10-CM

## 2010-03-13 DIAGNOSIS — E669 Obesity, unspecified: Secondary | ICD-10-CM

## 2010-03-13 DIAGNOSIS — E785 Hyperlipidemia, unspecified: Secondary | ICD-10-CM

## 2010-03-13 DIAGNOSIS — E119 Type 2 diabetes mellitus without complications: Secondary | ICD-10-CM

## 2010-03-15 ENCOUNTER — Telehealth (INDEPENDENT_AMBULATORY_CARE_PROVIDER_SITE_OTHER): Payer: Self-pay | Admitting: *Deleted

## 2010-03-21 ENCOUNTER — Telehealth (INDEPENDENT_AMBULATORY_CARE_PROVIDER_SITE_OTHER): Payer: Self-pay | Admitting: *Deleted

## 2010-03-21 NOTE — Assessment & Plan Note (Signed)
Summary: f/u DM   Vital Signs:  Patient profile:   50 year old female Height:      65 inches Weight:      245 pounds BMI:     40.92 O2 Sat:      98 % on Room air Pulse rate:   90 / minute BP sitting:   128 / 80  (left arm) Cuff size:   large  Vitals Entered By: Payton Spark CMA (March 13, 2010 1:45 PM)  O2 Flow:  Room air CC: F/U   Primary Care Provider:  Seymour Bars D.O.  CC:  F/U.  History of Present Illness: 50 yo F presents for f/u diabetes.    Current Medications (verified): 1)  Aspirin 81 Mg Tbec (Aspirin) .... Take 1 Tablet By Mouth Once A Day 2)  Metformin Hcl 850 Mg Tabs (Metformin Hcl) .Marland Kitchen.. 1 Tab By Mouth By Mouth Bid 3)  Lisinopril 20 Mg Tabs (Lisinopril) .... 1/2 Tab By Mouth Bid 4)  Lovastatin 20 Mg Tabs (Lovastatin) .... Take 1 Tablet By Mouth Once A Day 5)  Free Style Test Strips .... Use Daily As Directed 6)  1st Choice Lancets Thin  Misc (Lancets) .... Use Twice Daily As Directed 7)  Maxalt-Mlt 10 Mg Tbdp (Rizatriptan Benzoate) .Marland Kitchen.. 1 Tab By Mouth X 1 At Onset of Migraine; Repeat in 2 Hrs If Needed 8)  Tramadol Hcl 50 Mg Tabs (Tramadol Hcl) .Marland Kitchen.. 1 Tab By Mouth Three Times A Day As Needed Pain 9)  Levothroid 175 Mcg Tabs (Levothyroxine Sodium) .... Take 1 Tab By Mouth Every Morning 10)  Norflex 100mg  .... Take 1 Tab By Mouth Two Times A Day 11)  Vitamin D3 2000 Unit Caps (Cholecalciferol) .... Take One Capsule By Mouth Daily 12)  Norvasc 5 Mg Tabs (Amlodipine Besylate) .Marland Kitchen.. 1 Tab By Mouth Daily 13)  Lamictal 100 Mg Tabs (Lamotrigine) .Marland Kitchen.. 1 Tab By Mouth Two Times A Day  Allergies (verified): 1)  ! * Oral Contraceptive Pills  Past History:  Past Surgical History: ETT neg for ischemia- 7 mets  pelvic u/s normal total thyroidectomy for thyroid cancer 02-2009 plantar fasciitis surgery (L) Dr Yates Decamp 01-2010   Impression & Recommendations:  Problem # 1:  DIABETES MELLITUS II, UNCOMPLICATED (ICD-250.00) A1C up to 7.5 from 7.2 after multiple  steroid shots for plantar fasciitis leading up to recent surgery with Dr Yates Decamp.  Continue current meds.  Work more onlow sugar low carb diet and increasing phys activity.  On ACEi.  Due for eye exam.   Her updated medication list for this problem includes:    Aspirin 81 Mg Tbec (Aspirin) .Marland Kitchen... Take 1 tablet by mouth once a day    Metformin Hcl 850 Mg Tabs (Metformin hcl) .Marland Kitchen... 1 tab by mouth by mouth bid    Lisinopril 20 Mg Tabs (Lisinopril) .Marland Kitchen... 1/2 tab by mouth bid  Labs Reviewed: Creat: 0.67 (02/19/2010)   Microalbumin: 10 (09/13/2008)  Last Eye Exam: no retinopathy, Dr Sherrie George (04/07/2008) Reviewed HgBA1c results: 7.5 (02/19/2010)  7.2 (04/27/2009)  Problem # 2:  HYPERTENSION, BENIGN SYSTEMIC (ICD-401.1) BP at goal on current meds and labs UTD.  Will RF tomorrow.  Her updated medication list for this problem includes:    Lisinopril 20 Mg Tabs (Lisinopril) .Marland Kitchen... 1/2 tab by mouth bid    Norvasc 5 Mg Tabs (Amlodipine besylate) .Marland Kitchen... 1 tab by mouth daily  BP today: 128/80 Prior BP: 117/78 (01/22/2010)  Labs Reviewed: K+: 4.2 (02/19/2010) Creat: : 0.67 (02/19/2010)  Chol: 159 (02/19/2010)   HDL: 65 (02/19/2010)   LDL: 80 (02/19/2010)   TG: 69 (02/19/2010)  Problem # 3:  HYPERLIPIDEMIA (ICD-272.4) Cholesterol checked this month.  At goal on Lovastatin.  Continue. Her updated medication list for this problem includes:    Lovastatin 20 Mg Tabs (Lovastatin) .Marland Kitchen... Take 1 tablet by mouth once a day  Labs Reviewed: SGOT: 14 (02/19/2010)   SGPT: 13 (02/19/2010)   HDL:65 (02/19/2010), 55 (09/14/2008)  LDL:80 (02/19/2010), 84 (09/14/2008)  Chol:159 (02/19/2010), 151 (09/14/2008)  Trig:69 (02/19/2010), 62 (09/14/2008)  Problem # 4:  OBESITY, NOS (ICD-278.00) Assessment: Unchanged BMI 40.9 c/w class III obesity complicated by T2DM, HTN, high chol. She is not very motivated at this point to make any changes though we did talk about increasing physical activity and making healthy  choices.  Complete Medication List: 1)  Aspirin 81 Mg Tbec (Aspirin) .... Take 1 tablet by mouth once a day 2)  Metformin Hcl 850 Mg Tabs (Metformin hcl) .Marland Kitchen.. 1 tab by mouth by mouth bid 3)  Lisinopril 20 Mg Tabs (Lisinopril) .... 1/2 tab by mouth bid 4)  Lovastatin 20 Mg Tabs (Lovastatin) .... Take 1 tablet by mouth once a day 5)  Free Style Test Strips  .... Use daily as directed 6)  1st Choice Lancets Thin Misc (Lancets) .... Use twice daily as directed 7)  Maxalt-mlt 10 Mg Tbdp (Rizatriptan benzoate) .Marland Kitchen.. 1 tab by mouth x 1 at onset of migraine; repeat in 2 hrs if needed 8)  Tramadol Hcl 50 Mg Tabs (Tramadol hcl) .Marland Kitchen.. 1 tab by mouth three times a day as needed pain 9)  Levothroid 175 Mcg Tabs (Levothyroxine sodium) .... Take 1 tab by mouth every morning 10)  Norflex 100mg   .... Take 1 tab by mouth two times a day 11)  Vitamin D3 2000 Unit Caps (Cholecalciferol) .... Take one capsule by mouth daily 12)  Norvasc 5 Mg Tabs (Amlodipine besylate) .Marland Kitchen.. 1 tab by mouth daily 13)  Lamictal 100 Mg Tabs (Lamotrigine) .Marland Kitchen.. 1 tab by mouth two times a day  Patient Instructions: 1)  A1C 7.5 (<7 is goal). 2)  Work on LOW SUGAR/ LOW [employer] with regular exercise (PT will help). 3)  AM fasting goal is 80-120; 2 hrs after dinner goal is <150. 4)  Stay on current meds. 5)  Return for f/u in 4 mos.   Orders Added: 1)  Est. Patient Level IV [57846]

## 2010-03-21 NOTE — Progress Notes (Signed)
Prescriptions: TRAMADOL HCL 50 MG TABS (TRAMADOL HCL) 1 tab by mouth three times a day as needed pain  #100 x 1   Entered by:   Payton Spark CMA   Authorized by:   Seymour Bars DO   Signed by:   Payton Spark CMA on 03/15/2010   Method used:   Printed then faxed to ...       [address] [phone]* (retail)       [address] [place], [place]  [address]       Ph: [phone]       Fax: [phone]   RxID:   [id number] MAXALT-MLT 10 MG TBDP (RIZATRIPTAN BENZOATE) 1 tab by mouth x 1 at onset of migraine; repeat in 2 hrs if needed  #27 x 3   Entered by:   Payton Spark CMA   Authorized by:   Seymour Bars DO   Signed by:   Payton Spark CMA on 03/15/2010   Method used:   Printed then faxed to ...       [address] [phone]* (retail)       [address] [place], [place]  [address]       Ph: [phone]       Fax: [phone]   RxID:   [id number] LAMICTAL 100 MG TABS (LAMOTRIGINE) 1 tab by mouth two times a day  #180 x 1   Entered by:   Payton Spark CMA   Authorized by:   Seymour Bars DO   Signed by:   Payton Spark CMA on 03/15/2010   Method used:   Printed then faxed to ...       [address] [phone]* (retail)       [address] [place], [place]  [address]       Ph: [phone]       Fax: [phone]   RxID:   [id number] NORVASC 5 MG TABS (AMLODIPINE BESYLATE) 1 tab by mouth daily  #90 x 1   Entered by:   Payton Spark CMA   Authorized by:   Seymour Bars DO   Signed by:   Payton Spark CMA on 03/15/2010   Method used:   Printed then faxed to ...       [address] [phone]* (retail)       [address] [place], [place]  [address]       Ph: [phone]       Fax: [phone]   RxID:   [id number] NORFLEX 100MG  Take 1 tab by mouth two times a day  #180 x 1   Entered by:   Payton Spark CMA   Authorized by:   Seymour Bars DO   Signed by:   Payton Spark CMA on 03/15/2010   Method used:   Printed then faxed to ...  [address] [phone]* (retail)       [address] [place], [place]  [address]       Ph: [phone]       Fax: [phone]   RxID:   [id number] LOVASTATIN 20 MG TABS (LOVASTATIN) Take 1 tablet by mouth once a day  #90 x 1   Entered by:   Payton Spark CMA   Authorized by:   Seymour Bars DO   Signed by:  Payton Spark CMA on 03/15/2010   Method used:   Printed then faxed to ...       [address] [phone]* (retail)       [address] [place], [place]  [address]       Ph: [phone]       Fax: [phone]   RxID:   [id number] LISINOPRIL 20 MG TABS (LISINOPRIL) 1/2 tab by mouth bid  #90 Tablet x 1   Entered by:   Payton Spark CMA   Authorized by:   Seymour Bars DO   Signed by:   Payton Spark CMA on 03/15/2010   Method used:   Printed then faxed to ...       [address] [phone]* (retail)       [address] [place], [place]  [address]       Ph: [phone]       Fax: [phone]   RxID:   [id number] METFORMIN HCL 850 MG TABS (METFORMIN HCL) 1 tab by mouth by mouth bid  #180 Tablet x 1   Entered by:   Payton Spark CMA   Authorized by:   Seymour Bars DO   Signed by:   Payton Spark CMA on 03/15/2010   Method used:   Printed then faxed to ...       [address] [phone]* (retail)       [address] [place], [place]  [address]       Ph: [phone]       Fax: [phone]   RxID:   [id number]

## 2010-03-26 NOTE — Progress Notes (Signed)
Summary: Cream for rash  Phone Note Call from Patient      New/Updated Medications: TRIAMCINOLONE ACETONIDE 0.5 % CREA (TRIAMCINOLONE ACETONIDE) Apply to rash two times a day x 10 days Prescriptions: TRIAMCINOLONE ACETONIDE 0.5 % CREA (TRIAMCINOLONE ACETONIDE) Apply to rash two times a day x 10 days  #1 tube x 0   Entered by:   Payton Spark CMA   Authorized by:   Seymour Bars DO   Signed by:   Payton Spark CMA on 03/21/2010   Method used:   Electronically to        [facility] [phone]* (retail)       [address] [place], [place]  [address]       Ph: [phone]       Fax: [phone]   RxID:   [id number]  Pt states she had flare of rash and needs refill on triamcinolone cream.

## 2010-04-26 ENCOUNTER — Encounter: Payer: Self-pay | Admitting: Family Medicine

## 2010-07-10 ENCOUNTER — Ambulatory Visit: Payer: BC Managed Care – PPO | Admitting: Family Medicine

## 2010-07-18 ENCOUNTER — Other Ambulatory Visit: Payer: Self-pay | Admitting: Family Medicine

## 2010-07-18 ENCOUNTER — Encounter: Payer: Self-pay | Admitting: Family Medicine

## 2010-07-18 ENCOUNTER — Ambulatory Visit (INDEPENDENT_AMBULATORY_CARE_PROVIDER_SITE_OTHER): Payer: BC Managed Care – PPO | Admitting: Family Medicine

## 2010-07-18 VITALS — BP 118/82 | HR 84 | Ht 65.0 in | Wt 243.0 lb

## 2010-07-18 DIAGNOSIS — E119 Type 2 diabetes mellitus without complications: Secondary | ICD-10-CM

## 2010-07-18 LAB — POCT UA - MICROALBUMIN
Creatinine, POC: 200 mg/dL
Microalbumin Ur, POC: 80 mg/dL

## 2010-07-18 LAB — POCT GLYCOSYLATED HEMOGLOBIN (HGB A1C): Hemoglobin A1C: 7.2

## 2010-07-18 MED ORDER — LOVASTATIN 20 MG PO TABS: 20.0000 mg | ORAL_TABLET | Freq: Every day | ORAL | Status: DC

## 2010-07-18 MED ORDER — TRAMADOL HCL 50 MG PO TABS: 50.0000 mg | ORAL_TABLET | Freq: Four times a day (QID) | ORAL | Status: DC | PRN

## 2010-07-18 MED ORDER — METFORMIN HCL 850 MG PO TABS: 850.0000 mg | ORAL_TABLET | Freq: Two times a day (BID) | ORAL | Status: DC

## 2010-07-18 MED ORDER — LISINOPRIL 20 MG PO TABS: 20.0000 mg | ORAL_TABLET | Freq: Every day | ORAL | Status: DC

## 2010-07-18 NOTE — Assessment & Plan Note (Addendum)
A1C good at 7.2, down from 7.5.  Urine microalbumin +, on ACEi.  Continue current meds.  Work on diet, exercise, wt loss. Return for f/u in 4 mos.  No changes made today.  BP at goal.  Labs UTD.

## 2010-07-18 NOTE — Patient Instructions (Signed)
A1C:   Stay on current meds.  Work on low sugar/ low carb diet, regular exercise.  Return for f/u in 4 mos.

## 2010-07-18 NOTE — Progress Notes (Signed)
  Subjective:    Patient ID: Evalina Field, female    DOB: [birth year], 50 y.o.   MRN: [id number]  HPI  50 yo F presents for f/u visit.  She has T2DM.  She is on metformin 850 mg bid.  She is seeing neuro for her migraines, fairly well controlled now.  Her AM fastings are 130s to 140s.  She had full labs done in Feb.  BP looks great on current meds.  No low sugars.  She has not started walking yet because of ongoing heel pain from surgery.  She is doing gardening.  She has failed to lose any weight.    She sees endocrinology for her thyroid.    BP 118/82  Pulse 84  Ht 5\' 5"  (1.651 m)  Wt 243 lb (110.224 kg)  BMI 40.44 kg/m2  SpO2 97%   Review of Systems  Constitutional: Negative for fatigue.  Respiratory: Negative for shortness of breath.   Cardiovascular: Negative for chest pain, palpitations and leg swelling.  Neurological: Positive for headaches.       Objective:   Physical Exam  Constitutional: She appears well-developed and well-nourished. No distress.       obese  HENT:  Mouth/Throat: Oropharynx is clear and moist.  Eyes: No scleral icterus.  Neck: No thyromegaly present.  Cardiovascular: Normal rate, regular rhythm and normal heart sounds.   No murmur heard. Pulmonary/Chest: Effort normal and breath sounds normal. No respiratory distress.  Musculoskeletal: She exhibits no edema.  Skin: Skin is warm and dry.  Psychiatric: She has a normal mood and affect.          Assessment & Plan:

## 2010-07-22 ENCOUNTER — Telehealth: Payer: Self-pay | Admitting: Family Medicine

## 2010-07-22 NOTE — Telephone Encounter (Signed)
Patient's pharmacy called left message on voicemail request to know if pt is aware of directions changed on her med. The number to pharmacy [phone] reference [phone] also request to know if the patient has another telephone number listed on her acct

## 2010-11-07 ENCOUNTER — Telehealth: Payer: Self-pay | Admitting: Family Medicine

## 2010-11-07 MED ORDER — AMLODIPINE BESYLATE 5 MG PO TABS: 5.0000 mg | ORAL_TABLET | Freq: Every day | ORAL | Status: DC

## 2010-11-07 NOTE — Telephone Encounter (Signed)
Closed

## 2010-11-19 ENCOUNTER — Ambulatory Visit (INDEPENDENT_AMBULATORY_CARE_PROVIDER_SITE_OTHER): Payer: BC Managed Care – PPO | Admitting: Family Medicine

## 2010-11-19 ENCOUNTER — Encounter: Payer: Self-pay | Admitting: Family Medicine

## 2010-11-19 DIAGNOSIS — E119 Type 2 diabetes mellitus without complications: Secondary | ICD-10-CM

## 2010-11-19 DIAGNOSIS — Z1211 Encounter for screening for malignant neoplasm of colon: Secondary | ICD-10-CM

## 2010-11-19 LAB — BASIC METABOLIC PANEL
BUN: 11 mg/dL (ref 6–23)
CO2: 28 mEq/L (ref 19–32)
Calcium: 8.5 mg/dL (ref 8.4–10.5)
Chloride: 100 mEq/L (ref 96–112)
Creat: 0.62 mg/dL (ref 0.50–1.10)
Glucose, Bld: 234 mg/dL — ABNORMAL HIGH (ref 70–99)
Potassium: 4.6 mEq/L (ref 3.5–5.3)
Sodium: 138 mEq/L (ref 135–145)

## 2010-11-19 LAB — POCT GLYCOSYLATED HEMOGLOBIN (HGB A1C): Hemoglobin A1C: 7.6

## 2010-11-19 MED ORDER — AMLODIPINE BESYLATE 5 MG PO TABS: 5.0000 mg | ORAL_TABLET | Freq: Every day | ORAL | Status: DC

## 2010-11-19 MED ORDER — METFORMIN HCL 1000 MG PO TABS: 1000.0000 mg | ORAL_TABLET | Freq: Two times a day (BID) | ORAL | Status: DC

## 2010-11-19 MED ORDER — LOVASTATIN 20 MG PO TABS: 20.0000 mg | ORAL_TABLET | Freq: Every day | ORAL | Status: DC

## 2010-11-19 MED ORDER — LEVOTHYROXINE SODIUM 200 MCG PO TABS: 200.0000 ug | ORAL_TABLET | Freq: Every day | ORAL | Status: DC

## 2010-11-19 MED ORDER — LISINOPRIL 20 MG PO TABS: 20.0000 mg | ORAL_TABLET | Freq: Every day | ORAL | Status: DC

## 2010-11-19 NOTE — Patient Instructions (Signed)
I am increasing your metformin to 1000 twice a day Follow up in 3 months Work on increasing your activity level and working on weight loss.

## 2010-11-19 NOTE — Progress Notes (Signed)
  Subjective:    Patient ID: Martha Woods, female    DOB: 1960-07-07, 50 y.o.   MRN: [id number]  Diabetes She presents for her follow-up diabetic visit. She has type 2 diabetes mellitus. Her disease course has been stable. There are no hypoglycemic associated symptoms. Pertinent negatives for diabetes include no chest pain, no foot ulcerations, no polydipsia, no polyphagia and no polyuria. There are no hypoglycemic complications. Symptoms are stable. Current diabetic treatment includes oral agent (monotherapy). She is compliant with treatment all of the time. Her weight is stable. She rarely participates in exercise. Her breakfast blood glucose range is generally 110-130 mg/dl. Eye exam is current.   Rash under the Right breast for 1 week.  Has been using dexamethasone cream with no relief.     Review of Systems  Cardiovascular: Negative for chest pain.  Genitourinary: Negative for polyuria.  Hematological: Negative for polydipsia and polyphagia.       Objective:   Physical Exam  Constitutional: She is oriented to person, place, and time. She appears well-developed and well-nourished.  HENT:  Head: Normocephalic and atraumatic.  Cardiovascular: Normal rate, regular rhythm and normal heart sounds.   Pulmonary/Chest: Effort normal and breath sounds normal.  Abdominal: Soft. Bowel sounds are normal.  Neurological: She is alert and oriented to person, place, and time.  Skin: Skin is warm and dry.  Psychiatric: She has a normal mood and affect. Her behavior is normal.          Assessment & Plan:  DM-Not at goal. She has not been walking as much. Inc metformin to 1000 bid. Work on diet and activity and f/u in 3 mo. Foot exam normal today.  Lab Results  Component Value Date   HGBA1C 7.6 11/19/2010   Discussed need for colonoscoyp. She declined but says will do stool cards. Given cards today.   She needs all her rx printed for 90 days supply.

## 2010-11-25 ENCOUNTER — Telehealth: Payer: Self-pay | Admitting: *Deleted

## 2010-11-25 MED ORDER — NYSTATIN 100000 UNIT/GM EX OINT: TOPICAL_OINTMENT | Freq: Two times a day (BID) | CUTANEOUS | Status: DC

## 2010-11-25 NOTE — Telephone Encounter (Signed)
Rx sent 

## 2010-11-25 NOTE — Telephone Encounter (Signed)
Pt states you were going to send Nystatin Rx to [facility]. I don't see it in chart. Please order or advise.

## 2010-11-26 ENCOUNTER — Other Ambulatory Visit: Payer: Self-pay | Admitting: *Deleted

## 2010-11-26 MED ORDER — NYSTATIN 100000 UNIT/GM EX OINT: TOPICAL_OINTMENT | Freq: Two times a day (BID) | CUTANEOUS | Status: AC

## 2011-02-18 ENCOUNTER — Ambulatory Visit: Payer: BC Managed Care – PPO | Admitting: Family Medicine

## 2011-02-20 ENCOUNTER — Other Ambulatory Visit: Payer: Self-pay | Admitting: Family Medicine

## 2011-02-20 ENCOUNTER — Ambulatory Visit (INDEPENDENT_AMBULATORY_CARE_PROVIDER_SITE_OTHER): Payer: BC Managed Care – PPO | Admitting: Family Medicine

## 2011-02-20 ENCOUNTER — Encounter: Payer: Self-pay | Admitting: Family Medicine

## 2011-02-20 DIAGNOSIS — E890 Postprocedural hypothyroidism: Secondary | ICD-10-CM | POA: Insufficient documentation

## 2011-02-20 DIAGNOSIS — E039 Hypothyroidism, unspecified: Secondary | ICD-10-CM

## 2011-02-20 DIAGNOSIS — N760 Acute vaginitis: Secondary | ICD-10-CM

## 2011-02-20 DIAGNOSIS — E785 Hyperlipidemia, unspecified: Secondary | ICD-10-CM

## 2011-02-20 DIAGNOSIS — I10 Essential (primary) hypertension: Secondary | ICD-10-CM

## 2011-02-20 DIAGNOSIS — E119 Type 2 diabetes mellitus without complications: Secondary | ICD-10-CM

## 2011-02-20 LAB — POCT GLYCOSYLATED HEMOGLOBIN (HGB A1C): Hemoglobin A1C: 8

## 2011-02-20 MED ORDER — SITAGLIPTIN PHOS-METFORMIN HCL 50-1000 MG PO TABS: 1.0000 | ORAL_TABLET | Freq: Two times a day (BID) | ORAL | Status: DC

## 2011-02-20 MED ORDER — FLUCONAZOLE 150 MG PO TABS: 150.0000 mg | ORAL_TABLET | Freq: Once | ORAL | Status: AC

## 2011-02-20 NOTE — Patient Instructions (Signed)

## 2011-02-20 NOTE — Progress Notes (Signed)
  Subjective:    Patient ID: Martha Woods, female    DOB: February 04, 1960, 51 y.o.   MRN: [id number]  Diabetes She presents for her follow-up diabetic visit. She has type 2 diabetes mellitus. Her disease course has been stable. There are no hypoglycemic associated symptoms. Pertinent negatives for diabetes include no blurred vision, no foot paresthesias, no foot ulcerations, no polydipsia, no polyphagia, no polyuria and no visual change. There are no hypoglycemic complications. Symptoms are stable. Her weight is stable. She has had a previous visit with a dietician. There is no change in her home blood glucose trend. Her breakfast blood glucose range is generally 110-130 mg/dl. An ACE inhibitor/angiotensin II receptor blocker is being taken. Eye exam is current.  Hypertension This is a chronic problem. The current episode started more than 1 year ago. Pertinent negatives include no blurred vision, peripheral edema or shortness of breath. There are no associated agents to hypertension. Past treatments include ACE inhibitors and calcium channel blockers. There are no compliance problems.     Vaginal ithcing x 5 days.  She thinks may have a yeast infection. NO pain but uncomfortable. No lesion or urinary sxs. No recent ABX.     Review of Systems  Eyes: Negative for blurred vision.  Respiratory: Negative for shortness of breath.   Genitourinary: Negative for polyuria.  Hematological: Negative for polydipsia and polyphagia.       Objective:   Physical Exam  Constitutional: She is oriented to person, place, and time. She appears well-developed and well-nourished.  HENT:  Head: Normocephalic and atraumatic.  Cardiovascular: Normal rate, regular rhythm and normal heart sounds.   Pulmonary/Chest: Effort normal and breath sounds normal.  Neurological: She is alert and oriented to person, place, and time.  Skin: Skin is warm and dry.  Psychiatric: She has a normal mood and affect. Her  behavior is normal.          Assessment & Plan:  DM-  Not well controlled. Change med ot Janumet. F/U in 3 months. Needs to start a regular exercise program.  Lab Results  Component Value Date   HGBA1C 8.0 02/20/2011     HTN- Looks fantastic. Continue current regimen. Work on exercise and diet.   Hyperlipidemia - Recheck levels since has been a year to make sure at goal  Vaignitis - Wet prep collected. Will call w/ results. Will go ahead and send over diflucan x 1.

## 2011-02-25 LAB — WET PREP, GENITAL
Clue Cells Wet Prep HPF POC: NONE SEEN
Trich, Wet Prep: NONE SEEN
Yeast Wet Prep HPF POC: NONE SEEN

## 2011-03-24 ENCOUNTER — Other Ambulatory Visit: Payer: Self-pay | Admitting: Family Medicine

## 2011-04-04 ENCOUNTER — Other Ambulatory Visit: Payer: Self-pay | Admitting: Family Medicine

## 2011-04-04 DIAGNOSIS — Z1231 Encounter for screening mammogram for malignant neoplasm of breast: Secondary | ICD-10-CM

## 2011-05-13 ENCOUNTER — Inpatient Hospital Stay: Admission: RE | Admit: 2011-05-13 | Payer: BC Managed Care – PPO | Source: Ambulatory Visit

## 2011-05-22 ENCOUNTER — Ambulatory Visit: Payer: BC Managed Care – PPO | Admitting: Family Medicine

## 2011-05-23 ENCOUNTER — Other Ambulatory Visit: Payer: Self-pay | Admitting: *Deleted

## 2011-05-23 MED ORDER — SITAGLIPTIN PHOS-METFORMIN HCL 50-1000 MG PO TABS: 1.0000 | ORAL_TABLET | Freq: Two times a day (BID) | ORAL | Status: DC

## 2011-05-28 ENCOUNTER — Ambulatory Visit (INDEPENDENT_AMBULATORY_CARE_PROVIDER_SITE_OTHER): Payer: BC Managed Care – PPO | Admitting: Family Medicine

## 2011-05-28 ENCOUNTER — Encounter: Payer: Self-pay | Admitting: Family Medicine

## 2011-05-28 VITALS — BP 112/74 | HR 86 | Ht 65.0 in | Wt 235.0 lb

## 2011-05-28 DIAGNOSIS — IMO0001 Reserved for inherently not codable concepts without codable children: Secondary | ICD-10-CM

## 2011-05-28 DIAGNOSIS — E119 Type 2 diabetes mellitus without complications: Secondary | ICD-10-CM

## 2011-05-28 DIAGNOSIS — Z1211 Encounter for screening for malignant neoplasm of colon: Secondary | ICD-10-CM

## 2011-05-28 LAB — POCT GLYCOSYLATED HEMOGLOBIN (HGB A1C): Hemoglobin A1C: 7.1

## 2011-05-28 NOTE — Progress Notes (Signed)
  Subjective:    Patient ID: Martha Woods, female    DOB: [birth year], 51 y.o.   MRN: [id number]  Diabetes She presents for her follow-up diabetic visit. She has type 2 diabetes mellitus. Her disease course has been stable. Pertinent negatives for diabetes include no blurred vision, no foot paresthesias, no foot ulcerations, no polydipsia, no polyphagia, no polyuria and no visual change. Symptoms are stable. She is compliant with treatment all of the time. She is following a generally healthy diet. She participates in exercise weekly. Her breakfast blood glucose range is generally 110-130 mg/dl. An ACE inhibitor/angiotensin II receptor blocker is being taken. Eye exam is current.      Review of Systems  Eyes: Negative for blurred vision.  Genitourinary: Negative for polyuria.  Hematological: Negative for polydipsia and polyphagia.       Objective:   Physical Exam  Constitutional: She is oriented to person, place, and time. She appears well-developed and well-nourished.  HENT:  Head: Normocephalic and atraumatic.  Neck: Neck supple. No thyromegaly present.  Cardiovascular: Normal rate, regular rhythm and normal heart sounds.        Radial 2+ bilateral.    Pulmonary/Chest: Effort normal and breath sounds normal.  Lymphadenopathy:    She has no cervical adenopathy.  Neurological: She is alert and oriented to person, place, and time.  Skin: Skin is warm and dry.  Psychiatric: She has a normal mood and affect. Her behavior is normal.          Assessment & Plan:  DM- Uncontrolled.  Much improved this time down to 7.1.  Due for CMP and lipids.  Contineu to work on exercise and diet. She is exercising 2 days per weeks.   She is rescheduing her mammogram.   OK with colonoscopy referral.

## 2011-05-28 NOTE — Patient Instructions (Signed)

## 2011-07-25 ENCOUNTER — Other Ambulatory Visit: Payer: Self-pay | Admitting: Family Medicine

## 2011-08-23 ENCOUNTER — Other Ambulatory Visit: Payer: Self-pay | Admitting: Family Medicine

## 2011-08-29 ENCOUNTER — Ambulatory Visit: Payer: BC Managed Care – PPO | Admitting: Family Medicine

## 2011-08-29 DIAGNOSIS — Z0289 Encounter for other administrative examinations: Secondary | ICD-10-CM

## 2011-09-24 ENCOUNTER — Encounter: Payer: Self-pay | Admitting: Family Medicine

## 2011-09-24 ENCOUNTER — Ambulatory Visit (INDEPENDENT_AMBULATORY_CARE_PROVIDER_SITE_OTHER): Payer: BC Managed Care – PPO | Admitting: Family Medicine

## 2011-09-24 VITALS — BP 112/82 | HR 88 | Wt 237.0 lb

## 2011-09-24 DIAGNOSIS — E785 Hyperlipidemia, unspecified: Secondary | ICD-10-CM

## 2011-09-24 DIAGNOSIS — I10 Essential (primary) hypertension: Secondary | ICD-10-CM

## 2011-09-24 DIAGNOSIS — F439 Reaction to severe stress, unspecified: Secondary | ICD-10-CM

## 2011-09-24 DIAGNOSIS — E119 Type 2 diabetes mellitus without complications: Secondary | ICD-10-CM

## 2011-09-24 LAB — POCT UA - MICROALBUMIN
Albumin/Creatinine Ratio, Urine, POC: <30
Creatinine, POC: 200 mg/dL
Microalbumin Ur, POC: 10 mg/dL

## 2011-09-24 LAB — POCT GLYCOSYLATED HEMOGLOBIN (HGB A1C): Hemoglobin A1C: 7.4

## 2011-09-24 NOTE — Progress Notes (Addendum)
  Subjective:    Patient ID: Martha Woods, female    DOB: 12-Sep-1960, 51 y.o.   MRN: [id number]  HPI Here for diabetic follow up  - Sugars afre well controlled Has been a the gym a few times.  No hypoglycemic episodes.    Has been getting more HA recenlty and has been more stressed. She has been having more dizzy spells.  She would like a refill on her meclinzine.  Not sleeping well bc staying up late to get her work done.  She became tearful in the room talking about the stressors of her job. 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 She can never seem to get off to do her colonoscopy or mammogram.   Review of Systems     Objective:   Physical Exam  Constitutional: She is oriented to person, place, and time. She appears well-developed and well-nourished.  HENT:  Head: Normocephalic and atraumatic.  Cardiovascular: Normal rate, regular rhythm and normal heart sounds.        No carotid bruits.   Pulmonary/Chest: Effort normal and breath sounds normal.  Neurological: She is alert and oriented to person, place, and time.  Skin: Skin is warm and dry.  Psychiatric: She has a normal mood and affect. Her behavior is normal.          Assessment & Plan:  DM - Uncontrolled.  Urine mico done today. Says some exercise but pain from her heel spurs. Her A1c is elevated to 7.4 today. It was previously 7.1. We discussed adding an agent such as Invokana, versus something like Victoza or Byetta for better control since she started on Janumet. At this point time she really wants to work hard on diet and exercise for the next 3 months to try to get down on her own. If she is not successful then we'll need to add a second agent. We also discussed on strategies to lower her stress. In fact she may need to consider a new job she works such long hours.  Urine micron been is negative. Followup in 3 months.  HTN - well controlled. Her blood pressure looks  great today. Continue current regimen.  Will get flu shot through work.   Reminded her to schedule her mammo and colonoscopy.

## 2011-09-24 NOTE — Addendum Note (Signed)
Addended by: Judie Petit A on: 09/24/2011 09:36 AM   Modules accepted: Orders

## 2011-09-24 NOTE — Patient Instructions (Signed)
Invokana

## 2011-09-25 ENCOUNTER — Telehealth: Payer: Self-pay | Admitting: *Deleted

## 2011-09-25 MED ORDER — MECLIZINE HCL 25 MG PO TABS: 25.0000 mg | ORAL_TABLET | Freq: Three times a day (TID) | ORAL | Status: AC | PRN

## 2011-09-25 NOTE — Telephone Encounter (Signed)
Pt states she thought you were going to send an rx for Meclozine to the pharmacy. I don't see it listed. Please advise.

## 2011-09-25 NOTE — Telephone Encounter (Signed)
Sorry, i forgot to send it. Just sent.

## 2011-09-26 NOTE — Telephone Encounter (Signed)
LMOM

## 2011-11-19 ENCOUNTER — Other Ambulatory Visit: Payer: Self-pay | Admitting: Family Medicine

## 2011-12-04 ENCOUNTER — Ambulatory Visit (INDEPENDENT_AMBULATORY_CARE_PROVIDER_SITE_OTHER): Payer: BC Managed Care – PPO

## 2011-12-04 DIAGNOSIS — Z1231 Encounter for screening mammogram for malignant neoplasm of breast: Secondary | ICD-10-CM

## 2011-12-16 ENCOUNTER — Other Ambulatory Visit (HOSPITAL_COMMUNITY)
Admission: RE | Admit: 2011-12-16 | Discharge: 2011-12-16 | Disposition: A | Discharge status: Home / Self Care | Payer: BC Managed Care – PPO | Source: Ambulatory Visit | Attending: Family Medicine | Admitting: Family Medicine

## 2011-12-16 ENCOUNTER — Encounter: Payer: Self-pay | Admitting: Family Medicine

## 2011-12-16 ENCOUNTER — Ambulatory Visit (INDEPENDENT_AMBULATORY_CARE_PROVIDER_SITE_OTHER): Payer: BC Managed Care – PPO | Admitting: Family Medicine

## 2011-12-16 VITALS — BP 114/73 | HR 90 | Ht 64.0 in | Wt 233.0 lb

## 2011-12-16 DIAGNOSIS — Z01419 Encounter for gynecological examination (general) (routine) without abnormal findings: Secondary | ICD-10-CM | POA: Insufficient documentation

## 2011-12-16 DIAGNOSIS — R58 Hemorrhage, not elsewhere classified: Secondary | ICD-10-CM

## 2011-12-16 DIAGNOSIS — N950 Postmenopausal bleeding: Secondary | ICD-10-CM

## 2011-12-16 DIAGNOSIS — N841 Polyp of cervix uteri: Secondary | ICD-10-CM

## 2011-12-16 DIAGNOSIS — Z1151 Encounter for screening for human papillomavirus (HPV): Secondary | ICD-10-CM | POA: Insufficient documentation

## 2011-12-16 LAB — LIPID PANEL
Cholesterol: 138 mg/dL (ref 0–200)
HDL: 53 mg/dL (ref >39)
LDL Cholesterol: 69 mg/dL (ref 0–99)
Total CHOL/HDL Ratio: 2.6 Ratio
Triglycerides: 82 mg/dL (ref <150)
VLDL: 16 mg/dL (ref 0–40)

## 2011-12-16 LAB — COMPLETE METABOLIC PANEL WITH GFR
ALT: 13 U/L (ref 0–35)
AST: 15 U/L (ref 0–37)
Albumin: 4.2 g/dL (ref 3.5–5.2)
Alkaline Phosphatase: 67 U/L (ref 39–117)
BUN: 12 mg/dL (ref 6–23)
CO2: 24 mEq/L (ref 19–32)
Calcium: 8.8 mg/dL (ref 8.4–10.5)
Chloride: 99 mEq/L (ref 96–112)
Creat: 0.71 mg/dL (ref 0.50–1.10)
GFR, Est African American: >89 mL/min
GFR, Est Non African American: >89 mL/min
Glucose, Bld: 113 mg/dL — ABNORMAL HIGH (ref 70–99)
Potassium: 4.5 mEq/L (ref 3.5–5.3)
Sodium: 137 mEq/L (ref 135–145)
Total Bilirubin: 0.4 mg/dL (ref 0.3–1.2)
Total Protein: 6.9 g/dL (ref 6.0–8.3)

## 2011-12-16 NOTE — Progress Notes (Signed)
  Subjective:    Patient ID: Martha Woods, female    DOB: 1960/07/07, 51 y.o.   MRN: [id number]  HPI Last normal periods was in May, and then had spotting about 3 weeks ago.  Had precancerous cells on the cervix years ago.  Had cryotherapy.  No bleeding now. No pain or disharge or discomfort.     Review of Systems   Objective:   Physical Exam  Constitutional: She appears well-developed and well-nourished.  Genitourinary: Vagina normal and uterus normal. There is no rash, tenderness, lesion or injury on the right labia. There is no rash, tenderness, lesion or injury on the left labia. Cervix exhibits no motion tenderness, no discharge and no friability. Right adnexum displays no mass, no tenderness and no fullness. Left adnexum displays no mass, no tenderness and no fullness.  Skin: Skin is warm and dry.  Psychiatric: She has a normal mood and affect. Her behavior is normal.            Assessment & Plan:  Cervical polyp - I attempted removal with forceps.  Twisted and pulled and a tiny fragment came off.  Will refer to GYN for polyp removal to be sent for bx.  Could be the source of her bleeding.    Abnormal bleeding - likely menopausal.  Will check hormone levels.  Unsure if bleeding is from polyp she may not be completley post menopausal or may be from abnormal endometrial lining.   Her insurance ends tomorrow as she is changing jobs and won't have insurance for 90 days as she starts her new job.

## 2011-12-17 ENCOUNTER — Telehealth: Payer: Self-pay | Admitting: Family Medicine

## 2011-12-17 NOTE — Telephone Encounter (Signed)
Patient had to work this morning and was not able to go see dr leggett but she will make appt when she has ins in 90 days.  Tonya from [facility] wanted me to let you know.

## 2011-12-24 ENCOUNTER — Other Ambulatory Visit: Payer: Self-pay | Admitting: Physician Assistant

## 2011-12-24 DIAGNOSIS — IMO0001 Reserved for inherently not codable concepts without codable children: Secondary | ICD-10-CM | POA: Insufficient documentation

## 2011-12-24 NOTE — Progress Notes (Signed)
Called pt and aware of results referred to gyenocology for biopsy.

## 2011-12-29 ENCOUNTER — Telehealth: Payer: Self-pay | Admitting: *Deleted

## 2011-12-29 NOTE — Telephone Encounter (Signed)
See note

## 2012-01-15 ENCOUNTER — Ambulatory Visit (INDEPENDENT_AMBULATORY_CARE_PROVIDER_SITE_OTHER): Payer: BC Managed Care – PPO | Admitting: Obstetrics & Gynecology

## 2012-01-15 ENCOUNTER — Encounter: Payer: Self-pay | Admitting: Obstetrics & Gynecology

## 2012-01-15 VITALS — BP 114/73 | HR 97 | Temp 98.6°F | Resp 16 | Ht 64.0 in | Wt 231.0 lb

## 2012-01-15 DIAGNOSIS — R87610 Atypical squamous cells of undetermined significance on cytologic smear of cervix (ASC-US): Secondary | ICD-10-CM

## 2012-01-15 DIAGNOSIS — R87611 Atypical squamous cells cannot exclude high grade squamous intraepithelial lesion on cytologic smear of cervix (ASC-H): Secondary | ICD-10-CM

## 2012-01-15 DIAGNOSIS — N841 Polyp of cervix uteri: Secondary | ICD-10-CM

## 2012-01-15 DIAGNOSIS — Z01812 Encounter for preprocedural laboratory examination: Secondary | ICD-10-CM

## 2012-01-15 LAB — POCT URINE PREGNANCY: Preg Test, Ur: NEGATIVE

## 2012-01-15 NOTE — Progress Notes (Signed)
  Subjective:    Patient ID: Martha Woods, female    DOB: 07-Nov-1960, 52 y.o.   MRN: [id number]  HPI  She is here for a colposcopy because of her first abnormal pap 12/13 that showed ASCUS, cannot rule a high grade lesion. HPV negative. She was noted to have a polyp at her visit with Dr. Linford Arnold.  Review of Systems     Objective:   Physical Exam Her colpo was adequate and entirely normal. I did an ECC and then removed the entire polyp with hemostasis noted. She tolerated the procedure well.       Assessment & Plan:  Await ecc, polyp path. If normal, pap in 1 year

## 2012-01-21 ENCOUNTER — Encounter: Payer: BC Managed Care – PPO | Admitting: Obstetrics & Gynecology

## 2012-06-10 ENCOUNTER — Encounter: Payer: Self-pay | Admitting: Family Medicine

## 2012-06-10 ENCOUNTER — Ambulatory Visit (INDEPENDENT_AMBULATORY_CARE_PROVIDER_SITE_OTHER): Payer: BC Managed Care – PPO | Admitting: Family Medicine

## 2012-06-10 VITALS — BP 110/78 | HR 82 | Wt 231.0 lb

## 2012-06-10 DIAGNOSIS — I10 Essential (primary) hypertension: Secondary | ICD-10-CM

## 2012-06-10 DIAGNOSIS — E119 Type 2 diabetes mellitus without complications: Secondary | ICD-10-CM

## 2012-06-10 LAB — POCT GLYCOSYLATED HEMOGLOBIN (HGB A1C): Hemoglobin A1C: 6.5

## 2012-06-10 MED ORDER — LISINOPRIL 20 MG PO TABS: ORAL_TABLET | ORAL | Status: DC

## 2012-06-10 MED ORDER — SITAGLIPTIN PHOS-METFORMIN HCL 50-1000 MG PO TABS: ORAL_TABLET | ORAL | Status: DC

## 2012-06-10 MED ORDER — LOVASTATIN 20 MG PO TABS: ORAL_TABLET | ORAL | Status: DC

## 2012-06-10 NOTE — Progress Notes (Signed)
  Subjective:    Patient ID: Martha Woods, female    DOB: 1960/08/05, 52 y.o.   MRN: [id number]  HPI DM- No hypolgycemic events. Sugar running between 100-120.  No wounds that aren't well. No S.E. On medication. Walking for exercise.   HTN- WEll controled.  Pt denies chest pain, SOB, dizziness, or heart palpitations.  Taking meds as directed w/o problems.  Denies medication side effects.    Review of Systems     Objective:   Physical Exam  Constitutional: She is oriented to person, place, and time. She appears well-developed and well-nourished.  HENT:  Head: Normocephalic and atraumatic.  Cardiovascular: Normal rate, regular rhythm and normal heart sounds.   Pulmonary/Chest: Effort normal and breath sounds normal.  Neurological: She is alert and oriented to person, place, and time.  Skin: Skin is warm and dry.  Psychiatric: She has a normal mood and affect. Her behavior is normal.          Assessment & Plan:  DM- Well controlled.  On statin, ASA.  A1C is 6.5 today. Continue current regimen. Her A1c looks fantastic it is even better than last time. She's also lost a couple pounds which is great. Continue work on [employer] and regular exercise. She did be 30 depression sent to [facility] and wanted 90 day prescriptions printed to start using her mail order pharmacy. Followup in 3 months.  Hypertension-well-controlled her looks absolutely fantastic. That we can consider decreasing her amlodipine but she also takes this for her migraines so I would recommend that she potentially discuss this with her neurologist before making any changes.

## 2012-06-11 LAB — BASIC METABOLIC PANEL WITH GFR
BUN: 15 mg/dL (ref 6–23)
CO2: 31 mEq/L (ref 19–32)
Calcium: 8.9 mg/dL (ref 8.4–10.5)
Chloride: 102 mEq/L (ref 96–112)
Creat: 0.71 mg/dL (ref 0.50–1.10)
GFR, Est African American: >89 mL/min
GFR, Est Non African American: >89 mL/min
Glucose, Bld: 115 mg/dL — ABNORMAL HIGH (ref 70–99)
Potassium: 4.3 mEq/L (ref 3.5–5.3)
Sodium: 138 mEq/L (ref 135–145)

## 2012-06-11 NOTE — Progress Notes (Signed)
Quick Note:  All labs are normal. ______ 

## 2012-07-07 ENCOUNTER — Other Ambulatory Visit: Payer: Self-pay | Admitting: Family Medicine

## 2012-07-22 ENCOUNTER — Other Ambulatory Visit: Payer: Self-pay

## 2012-09-14 ENCOUNTER — Telehealth: Payer: Self-pay | Admitting: *Deleted

## 2012-09-14 ENCOUNTER — Ambulatory Visit (INDEPENDENT_AMBULATORY_CARE_PROVIDER_SITE_OTHER): Payer: BC Managed Care – PPO | Admitting: Family Medicine

## 2012-09-14 ENCOUNTER — Encounter: Payer: Self-pay | Admitting: Family Medicine

## 2012-09-14 VITALS — BP 122/72 | HR 97 | Wt 246.0 lb

## 2012-09-14 DIAGNOSIS — E785 Hyperlipidemia, unspecified: Secondary | ICD-10-CM

## 2012-09-14 DIAGNOSIS — E119 Type 2 diabetes mellitus without complications: Secondary | ICD-10-CM

## 2012-09-14 DIAGNOSIS — E669 Obesity, unspecified: Secondary | ICD-10-CM

## 2012-09-14 LAB — POCT GLYCOSYLATED HEMOGLOBIN (HGB A1C): Hemoglobin A1C: 6.6

## 2012-09-14 NOTE — Progress Notes (Signed)
  Subjective:    Patient ID: Martha Woods, female    DOB: 1961/01/01, 52 y.o.   MRN: [id number]  HPI DM- She says her brother came to visit and she gained a lot of weight. Has gained about 15 lbs. Home sugars 90-130.   No hypoglycemic evetns.  No exercise.   Hyperlipidemia - Taking meds w/o S.E.   Jackelyn Hoehn says she's been eating a lot. S. her brother came to visit. She's gained 15 pounds. She is also hypothyroid and takes the levothyroxine regularly.   Review of Systems     Objective:   Physical Exam  Constitutional: She is oriented to person, place, and time. She appears well-developed and well-nourished.  HENT:  Head: Normocephalic and atraumatic.  Neck: Neck supple. No thyromegaly present.  Cardiovascular: Normal rate, regular rhythm and normal heart sounds.   Pulmonary/Chest: Effort normal and breath sounds normal.  Neurological: She is alert and oriented to person, place, and time.  Skin: Skin is warm and dry.  Psychiatric: She has a normal mood and affect. Her behavior is normal.          Assessment & Plan:  DM - Well controlled.  A1C is 6.6 today. ON statin, ACE, ASA.  F/Uin 3 months Get back into exercise and diet. Needs to work on losing the 15 lbs she has gained.    Hyperlipidemia  - well controlled on mevacor. Due to recheck at f/u appt.  Lab Results  Component Value Date   CHOL 138 12/15/2011   HDL 53 12/15/2011   LDLCALC 69 12/15/2011   TRIG 82 12/15/2011   CHOLHDL 2.6 12/15/2011   Obesity- work on diet and exercise. Need ot check TSH as well.   Declined flu vaccine today.

## 2012-09-14 NOTE — Telephone Encounter (Signed)
Called to have last eye exam faxed over.Loralee Pacas [place]

## 2012-12-14 ENCOUNTER — Ambulatory Visit: Payer: BC Managed Care – PPO | Admitting: Family Medicine

## 2012-12-20 ENCOUNTER — Ambulatory Visit: Payer: BC Managed Care – PPO | Admitting: Family Medicine

## 2013-01-20 ENCOUNTER — Encounter: Payer: Self-pay | Admitting: Family Medicine

## 2013-01-20 ENCOUNTER — Ambulatory Visit (INDEPENDENT_AMBULATORY_CARE_PROVIDER_SITE_OTHER): Payer: BC Managed Care – PPO | Admitting: Family Medicine

## 2013-01-20 VITALS — BP 125/77 | HR 88 | Temp 97.5°F | Ht 65.0 in

## 2013-01-20 DIAGNOSIS — E119 Type 2 diabetes mellitus without complications: Secondary | ICD-10-CM

## 2013-01-20 DIAGNOSIS — E785 Hyperlipidemia, unspecified: Secondary | ICD-10-CM

## 2013-01-20 DIAGNOSIS — I10 Essential (primary) hypertension: Secondary | ICD-10-CM

## 2013-01-20 LAB — POCT UA - MICROALBUMIN
Albumin/Creatinine Ratio, Urine, POC: <30
Creatinine, POC: 200 mg/dL
Microalbumin Ur, POC: 30 mg/L

## 2013-01-20 LAB — POCT GLYCOSYLATED HEMOGLOBIN (HGB A1C): Hemoglobin A1C: 7.5

## 2013-01-20 MED ORDER — AMLODIPINE BESYLATE 5 MG PO TABS: 5.0000 mg | ORAL_TABLET | Freq: Every day | ORAL | Status: DC

## 2013-01-20 MED ORDER — LOVASTATIN 20 MG PO TABS: ORAL_TABLET | ORAL | Status: DC

## 2013-01-20 MED ORDER — LAMOTRIGINE 100 MG PO TABS: 100.0000 mg | ORAL_TABLET | Freq: Two times a day (BID) | ORAL | Status: DC

## 2013-01-20 MED ORDER — ORPHENADRINE CITRATE ER 100 MG PO TB12: 100.0000 mg | ORAL_TABLET | Freq: Two times a day (BID) | ORAL | Status: AC

## 2013-01-20 MED ORDER — SITAGLIPTIN PHOS-METFORMIN HCL 50-1000 MG PO TABS: ORAL_TABLET | ORAL | Status: DC

## 2013-01-20 NOTE — Progress Notes (Signed)
   Subjective:    Patient ID: Martha Woods, female    DOB: [birth year], 53 y.o.   MRN: [id number]  HPI Diabetes - no hypoglycemic events. No wounds or sores that are not healing well. No increased thirst or urination. Checking glucose at home. Taking medications as prescribed without any side effects. She recently switched jobs and it was extremely stressful. She says she was working in this 24 hours per day. She says it really was a job for 4 people. At her brother came to visit for the holidays and she realized she had absolutely no time to spend with him she decided to go back to her old job which has been less stressful but still very busy. She did run out of her Janumet about 2 weeks ago. She still had some old metformin Zyrtec that instead.  Hypertension- Pt denies chest pain, SOB, dizziness, or heart palpitations.  Taking meds as directed w/o problems.  Denies medication side effects.    Lipidemia-tolerating statin well without any side effects or problems.   Review of Systems     Objective:   Physical Exam  Constitutional: She is oriented to person, place, and time. She appears well-developed and well-nourished.  HENT:  Head: Normocephalic and atraumatic.  Neck: Neck supple. No thyromegaly present.  Cardiovascular: Normal rate, regular rhythm and normal heart sounds.   Pulmonary/Chest: Effort normal and breath sounds normal.  Lymphadenopathy:    She has no cervical adenopathy.  Neurological: She is alert and oriented to person, place, and time.  Skin: Skin is warm and dry.  Psychiatric: She has a normal mood and affect. Her behavior is normal.          Assessment & Plan:  Diabetes-uncontrolled. Hemoglobin A1c at 7.5 today. We discussed getting back on her Janumet and getting back on track with diet and exercise and weight loss now she is back in her old job. We also discussed the option of adding a second medication. She really wants to work on lifestyle changes  first. Followup in 3 months.  Hypertension-well-controlled. Continue current regimen. Refills given so that she can call them at a local pharmacy for now and she will call me with her mail order. Her new insurance has activated this month but she does not have a cardiac to know who her mail-order is going to be. She will contact us later this month with that information.  Hyperlipidemia-doing well overall. Due for lipid and CMP.

## 2013-02-02 ENCOUNTER — Telehealth: Payer: Self-pay | Admitting: *Deleted

## 2013-02-02 MED ORDER — AMLODIPINE BESYLATE 5 MG PO TABS: 5.0000 mg | ORAL_TABLET | Freq: Every day | ORAL | Status: DC

## 2013-02-02 MED ORDER — SITAGLIPTIN PHOS-METFORMIN HCL 50-1000 MG PO TABS: ORAL_TABLET | ORAL | Status: DC

## 2013-02-02 MED ORDER — LISINOPRIL 20 MG PO TABS: ORAL_TABLET | ORAL | Status: DC

## 2013-02-02 NOTE — Telephone Encounter (Signed)
Pt informed via VM that rx have been sent to [facility].Maryruth Eve, [place]

## 2013-04-21 ENCOUNTER — Ambulatory Visit: Payer: Self-pay | Admitting: Family Medicine

## 2013-05-03 ENCOUNTER — Ambulatory Visit: Payer: Self-pay | Admitting: Family Medicine

## 2013-05-05 LAB — COMPLETE METABOLIC PANEL WITH GFR
ALT: 14 U/L (ref 0–35)
AST: 20 U/L (ref 0–37)
Albumin: 4.1 g/dL (ref 3.5–5.2)
Alkaline Phosphatase: 64 U/L (ref 39–117)
BUN: 15 mg/dL (ref 6–23)
CO2: 30 mEq/L (ref 19–32)
Calcium: 8.6 mg/dL (ref 8.4–10.5)
Chloride: 99 mEq/L (ref 96–112)
Creat: 0.62 mg/dL (ref 0.50–1.10)
GFR, Est African American: >89 mL/min
GFR, Est Non African American: >89 mL/min
Glucose, Bld: 143 mg/dL — ABNORMAL HIGH (ref 70–99)
Potassium: 4.5 mEq/L (ref 3.5–5.3)
Sodium: 137 mEq/L (ref 135–145)
Total Bilirubin: 0.7 mg/dL (ref 0.2–1.2)
Total Protein: 7 g/dL (ref 6.0–8.3)

## 2013-05-05 LAB — LIPID PANEL
Cholesterol: 167 mg/dL (ref 0–200)
HDL: 74 mg/dL (ref >39)
LDL Cholesterol: 77 mg/dL (ref 0–99)
Total CHOL/HDL Ratio: 2.3 Ratio
Triglycerides: 79 mg/dL (ref <150)
VLDL: 16 mg/dL (ref 0–40)

## 2013-05-06 ENCOUNTER — Ambulatory Visit (INDEPENDENT_AMBULATORY_CARE_PROVIDER_SITE_OTHER): Payer: BC Managed Care – PPO | Admitting: Family Medicine

## 2013-05-06 ENCOUNTER — Encounter: Payer: Self-pay | Admitting: Family Medicine

## 2013-05-06 VITALS — BP 115/79 | HR 84 | Wt 246.0 lb

## 2013-05-06 DIAGNOSIS — E119 Type 2 diabetes mellitus without complications: Secondary | ICD-10-CM

## 2013-05-06 LAB — POCT GLYCOSYLATED HEMOGLOBIN (HGB A1C): Hemoglobin A1C: 8.2

## 2013-05-06 NOTE — Progress Notes (Signed)
   Subjective:    Patient ID: Martha Woods, female    DOB: July 15, 1960, 53 y.o.   MRN: [id number]  HPI Diabetes - no hypoglycemic events. No wounds or sores that are not healing well. No increased thirst or urination. Checking glucose at home. Taking medications as prescribed without any side effects.  Hasn't been exercising.    Hypertension- Pt denies chest pain, SOB, dizziness, or heart palpitations.  Taking meds as directed w/o problems.  Denies medication side effects.    Hyperlipidemia-tolerating statin well without any side effects or myalgias.  She does have a fine needle aspiration with ultrasound guidance of a lymph node. She recently has a diagnosis of papillary thyroid carcinoma. She is currently being followed by Dr. Leana Roe farmer with [facility].  Review of Systems     Objective:   Physical Exam  Constitutional: She is oriented to person, place, and time. She appears well-developed and well-nourished.  HENT:  Head: Normocephalic and atraumatic.  Cardiovascular: Normal rate, regular rhythm and normal heart sounds.   Pulmonary/Chest: Effort normal and breath sounds normal.  Neurological: She is alert and oriented to person, place, and time.  Skin: Skin is warm and dry.  Psychiatric: She has a normal mood and affect. Her behavior is normal.          Assessment & Plan:  DM - uncontrolled. Hemoglobin A1c is 8.2 today. We discussed treatment options. I really think she would be a good candidate for one of the newer agents like an SGLT inhibitor. Or possibly one of the injectable G. LP agonist. We discussed how these medications work. She really wants to work on exercise and diet for the next 3 months and try get back on track. I strongly encouraged her to consider a medication and adjusting her regimen since her sugars have not been well controlled for a total of 6 months at this point. I would prefer that we add an additional medication and work on getting her diet and  exercise under control and then possibly discontinue medication if no longer needed. She says she will think about it. Followup in 6 months.  HTN - Well controlled. Continue current regimen.  Hyperlpidemia - Tolerating statin well.  Continue current regimen. I reviewed her recent labs.

## 2013-07-11 ENCOUNTER — Other Ambulatory Visit: Payer: Self-pay | Admitting: *Deleted

## 2013-07-11 MED ORDER — LOVASTATIN 20 MG PO TABS: ORAL_TABLET | ORAL | Status: DC

## 2013-08-10 ENCOUNTER — Encounter: Payer: Self-pay | Admitting: Family Medicine

## 2013-08-10 ENCOUNTER — Ambulatory Visit (INDEPENDENT_AMBULATORY_CARE_PROVIDER_SITE_OTHER): Payer: BC Managed Care – PPO | Admitting: Family Medicine

## 2013-08-10 VITALS — BP 116/74 | HR 98 | Wt 246.0 lb

## 2013-08-10 DIAGNOSIS — I10 Essential (primary) hypertension: Secondary | ICD-10-CM

## 2013-08-10 DIAGNOSIS — G43909 Migraine, unspecified, not intractable, without status migrainosus: Secondary | ICD-10-CM | POA: Diagnosis not present

## 2013-08-10 DIAGNOSIS — E119 Type 2 diabetes mellitus without complications: Secondary | ICD-10-CM | POA: Diagnosis not present

## 2013-08-10 LAB — POCT GLYCOSYLATED HEMOGLOBIN (HGB A1C): Hemoglobin A1C: 7.2

## 2013-08-10 NOTE — Progress Notes (Signed)
   Subjective:    Patient ID: Barrington Ellison, female    DOB: 07-10-1960, 53 y.o.   MRN: [id number]  HPI Diabetes - no hypoglycemic events. No wounds or sores that are not healing well. No increased thirst or urination. Checking glucose at home. Taking medications as prescribed without any side effects. Has her eye exam schedule for August.    Hypertension- Pt denies chest pain, SOB, dizziness, or heart palpitations.  Taking meds as directed w/o problems.  Denies medication side effects.    Review of Systems     Objective:   Physical Exam  Constitutional: She is oriented to person, place, and time. She appears well-developed and well-nourished.  HENT:  Head: Normocephalic and atraumatic.  Cardiovascular: Normal rate, regular rhythm and normal heart sounds.   Pulmonary/Chest: Effort normal and breath sounds normal.  Neurological: She is alert and oriented to person, place, and time.  Skin: Skin is warm and dry.  Psychiatric: She has a normal mood and affect. Her behavior is normal.          Assessment & Plan:  Diabetes - much improved with A1C is 7.2 today. She's been a fantastic job with diet and exercise. She opted not to increase her medication at her last visit when her A1c was 8.2. Continue to work on diet, exercise, and weight loss. I will be happy to help her with the weight loss if she would like. Followup in 3 months. She has eye exam scheduled next month. Monofilament exam performed today.  HTN- Well controlled. Continue continue current regimen.

## 2013-08-10 NOTE — Addendum Note (Signed)
Addended by: Narda Rutherford on: 08/10/2013 01:38 PM   Modules accepted: Orders

## 2013-09-07 ENCOUNTER — Other Ambulatory Visit: Payer: Self-pay | Admitting: Family Medicine

## 2013-11-10 ENCOUNTER — Encounter: Payer: Self-pay | Admitting: Family Medicine

## 2013-11-10 ENCOUNTER — Ambulatory Visit (INDEPENDENT_AMBULATORY_CARE_PROVIDER_SITE_OTHER): Payer: BC Managed Care – PPO | Admitting: Family Medicine

## 2013-11-10 VITALS — BP 120/81 | HR 104 | Ht 65.0 in | Wt 246.0 lb

## 2013-11-10 DIAGNOSIS — E119 Type 2 diabetes mellitus without complications: Secondary | ICD-10-CM | POA: Diagnosis not present

## 2013-11-10 DIAGNOSIS — C73 Malignant neoplasm of thyroid gland: Secondary | ICD-10-CM

## 2013-11-10 DIAGNOSIS — I10 Essential (primary) hypertension: Secondary | ICD-10-CM

## 2013-11-10 DIAGNOSIS — L608 Other nail disorders: Secondary | ICD-10-CM

## 2013-11-10 DIAGNOSIS — D569 Thalassemia, unspecified: Secondary | ICD-10-CM | POA: Insufficient documentation

## 2013-11-10 DIAGNOSIS — L609 Nail disorder, unspecified: Secondary | ICD-10-CM

## 2013-11-10 LAB — POCT UA - MICROALBUMIN
Albumin/Creatinine Ratio, Urine, POC: <30
Creatinine, POC: 100 mg/dL
Microalbumin Ur, POC: 30 mg/L

## 2013-11-10 LAB — POCT GLYCOSYLATED HEMOGLOBIN (HGB A1C): Hemoglobin A1C: 9.1

## 2013-11-10 MED ORDER — NYSTATIN 100000 UNIT/GM EX POWD: CUTANEOUS | Status: DC

## 2013-11-10 MED ORDER — LISINOPRIL 20 MG PO TABS: ORAL_TABLET | ORAL | Status: DC

## 2013-11-10 MED ORDER — LOVASTATIN 20 MG PO TABS: ORAL_TABLET | ORAL | Status: DC

## 2013-11-10 NOTE — Assessment & Plan Note (Signed)
Followed by Dr. Malachy Mood

## 2013-11-10 NOTE — Progress Notes (Signed)
   Subjective:    Patient ID: Martha Woods, female    DOB: 03-27-1960, 53 y.o.   MRN: [id number]  Diabetes   Diabetes - no hypoglycemic events. No wounds or sores that are not healing well. No increased thirst or urination. Checking glucose at home. Taking medications as prescribed without any side effects. Had eye exam 3 months ago. Will call to get rreport. Currently taking Janumet. He says her home blood sugars have been running between 100 and 120s fasting.   Hypertension- Pt denies chest pain, SOB, dizziness, or heart palpitations.  Taking meds as directed w/o problems.  Denies medication side effects.    Right great toenail is deformed, thick and black looking. Says it started after started wearing orthotics.  She used to see Dr. Wardell Honour.    Review of Systems     Objective:   Physical Exam  Constitutional: She is oriented to person, place, and time. She appears well-developed and well-nourished.  HENT:  Head: Normocephalic and atraumatic.  Cardiovascular: Normal rate, regular rhythm and normal heart sounds.   Pulmonary/Chest: Effort normal and breath sounds normal.  Neurological: She is alert and oriented to person, place, and time.  Skin: Skin is warm and dry.  Psychiatric: She has a normal mood and affect. Her behavior is normal.          Assessment & Plan:  DM- uncontrolled but she has thalassemia.  We will confirm with the serum draw for her hemoglobin A1c. Discussed with her that if her A1c is over 7 that we will probably try one of the newer agents such as [place] or Invokana. Will need to provide her with a coupon card so that its covered. Otherwise will follow-up in 3 months. She is on an ACE inhibitor and a statin. Consider increasing her lovastatin to a moderate dose of 40 mg.  HTN- well controlled. Continue current regimen.    Abnormal toenail - discussed that it could be a dystrophic nail versus fungal. Recommend total nail removal and then let the  new nail grow back in. If it grows back more normal than it was likely fungal. If it comes back exactly the same that it is likely a dystrophic nail and it will stay the same and less weight permanently remove it. She will schedule at her convenience.

## 2013-11-14 ENCOUNTER — Encounter: Payer: Self-pay | Admitting: Family Medicine

## 2013-11-15 ENCOUNTER — Other Ambulatory Visit: Payer: Self-pay | Admitting: Family Medicine

## 2013-11-15 LAB — BASIC METABOLIC PANEL WITH GFR
BUN: 23 mg/dL (ref 6–23)
CO2: 18 mEq/L — ABNORMAL LOW (ref 19–32)
Calcium: 8.8 mg/dL (ref 8.4–10.5)
Chloride: 109 mEq/L (ref 96–112)
Creat: 1.03 mg/dL (ref 0.50–1.10)
GFR, Est African American: 71 mL/min
GFR, Est Non African American: 62 mL/min
Glucose, Bld: 94 mg/dL (ref 70–99)
Potassium: 4 mEq/L (ref 3.5–5.3)
Sodium: 142 mEq/L (ref 135–145)

## 2013-11-15 LAB — HEMOGLOBIN A1C
Hgb A1c MFr Bld: 7.8 % — ABNORMAL HIGH (ref <5.7)
Mean Plasma Glucose: 177 mg/dL — ABNORMAL HIGH (ref <117)

## 2013-11-15 MED ORDER — DAPAGLIFLOZIN PROPANEDIOL 5 MG PO TABS: 5.0000 mg | ORAL_TABLET | Freq: Every day | ORAL | Status: DC

## 2013-11-15 NOTE — Progress Notes (Signed)
Quick Note:  All labs are normal. ______ 

## 2013-12-20 ENCOUNTER — Telehealth: Payer: Self-pay | Admitting: *Deleted

## 2013-12-20 MED ORDER — SITAGLIPTIN PHOS-METFORMIN HCL 50-1000 MG PO TABS: ORAL_TABLET | ORAL | Status: DC

## 2013-12-20 NOTE — Telephone Encounter (Signed)
Med refill.Martha Woods [name]

## 2014-01-18 ENCOUNTER — Telehealth: Payer: Self-pay | Admitting: *Deleted

## 2014-01-18 NOTE — Telephone Encounter (Signed)
Martha Woods initiated via cover my meds. Awaiting decision.

## 2014-01-19 ENCOUNTER — Telehealth: Payer: Self-pay | Admitting: *Deleted

## 2014-01-19 NOTE — Telephone Encounter (Signed)
Auth approved for [place] through [employer].  Approval dates are 01/19/14-01/20/15.

## 2014-02-10 ENCOUNTER — Ambulatory Visit (INDEPENDENT_AMBULATORY_CARE_PROVIDER_SITE_OTHER): Payer: BC Managed Care – PPO | Admitting: Family Medicine

## 2014-02-10 ENCOUNTER — Encounter: Payer: Self-pay | Admitting: Family Medicine

## 2014-02-10 ENCOUNTER — Other Ambulatory Visit: Payer: Self-pay | Admitting: *Deleted

## 2014-02-10 VITALS — BP 113/75 | HR 92 | Ht 65.0 in | Wt 237.0 lb

## 2014-02-10 DIAGNOSIS — E119 Type 2 diabetes mellitus without complications: Secondary | ICD-10-CM

## 2014-02-10 DIAGNOSIS — I10 Essential (primary) hypertension: Secondary | ICD-10-CM | POA: Diagnosis not present

## 2014-02-10 LAB — POCT GLYCOSYLATED HEMOGLOBIN (HGB A1C): Hemoglobin A1C: 7.2

## 2014-02-10 MED ORDER — NYSTATIN 100000 UNIT/GM EX POWD: CUTANEOUS | Status: DC

## 2014-02-10 NOTE — Progress Notes (Signed)
   Subjective:    Patient ID: Martha Woods, female    DOB: 01/18/1960, 54 y.o.   MRN: [id number]  HPI Diabetes - no hypoglycemic events. No wounds or sores that are not healing well. No increased thirst or urination. Checking glucose at home. Taking medications as prescribed without any side effects. Lab Results  Component Value Date   HGBA1C 7.8* 11/14/2013    Hypertension- Pt denies chest pain, SOB, dizziness, or heart palpitations.  Taking meds as directed w/o problems.  Denies medication side effects.     Review of Systems     Objective:   Physical Exam  Constitutional: She is oriented to person, place, and time. She appears well-developed and well-nourished.  HENT:  Head: Normocephalic and atraumatic.  Cardiovascular: Normal rate, regular rhythm and normal heart sounds.   Pulmonary/Chest: Effort normal and breath sounds normal.  Neurological: She is alert and oriented to person, place, and time.  Skin: Skin is warm and dry.  Psychiatric: She has a normal mood and affect. Her behavior is normal.          Assessment & Plan:  DM- A1c is down to 7.2 which is fantastic. She's also down 9 pounds which is fantastic. Continue work on diet and weight loss over the next 3 months. I'm not going to adjust her regimen at this point. I think if she's able to do that her A1c will be under 7 at the next visit in 3 months.  HTN- Well controlled.  Continue current regimen. Call for any problems or concerns.

## 2014-03-02 ENCOUNTER — Other Ambulatory Visit: Payer: Self-pay | Admitting: *Deleted

## 2014-03-02 DIAGNOSIS — Z1231 Encounter for screening mammogram for malignant neoplasm of breast: Secondary | ICD-10-CM

## 2014-03-15 ENCOUNTER — Ambulatory Visit (INDEPENDENT_AMBULATORY_CARE_PROVIDER_SITE_OTHER): Payer: BLUE CROSS/BLUE SHIELD

## 2014-03-15 DIAGNOSIS — Z1231 Encounter for screening mammogram for malignant neoplasm of breast: Secondary | ICD-10-CM

## 2014-03-20 ENCOUNTER — Other Ambulatory Visit: Payer: Self-pay | Admitting: Family Medicine

## 2014-03-20 ENCOUNTER — Ambulatory Visit (INDEPENDENT_AMBULATORY_CARE_PROVIDER_SITE_OTHER): Payer: BLUE CROSS/BLUE SHIELD | Admitting: Family Medicine

## 2014-03-20 ENCOUNTER — Encounter: Payer: Self-pay | Admitting: Family Medicine

## 2014-03-20 VITALS — BP 114/74 | HR 86 | Wt 236.0 lb

## 2014-03-20 DIAGNOSIS — R10811 Right upper quadrant abdominal tenderness: Secondary | ICD-10-CM

## 2014-03-20 DIAGNOSIS — R1013 Epigastric pain: Secondary | ICD-10-CM | POA: Diagnosis not present

## 2014-03-20 DIAGNOSIS — R079 Chest pain, unspecified: Secondary | ICD-10-CM

## 2014-03-20 MED ORDER — OMEPRAZOLE 40 MG PO CPDR: 40.0000 mg | DELAYED_RELEASE_CAPSULE | Freq: Every day | ORAL | Status: DC

## 2014-03-20 NOTE — Patient Instructions (Signed)
Take the omeprazole about 20-30 minutes before first meal the day. She can take her first dose tonight before bedtime and then start again tomorrow morning. Please see handout for additional information on foods to avoid that aggravate reflux.  Food Choices for Gastroesophageal Reflux Disease When you have gastroesophageal reflux disease (GERD), the foods you eat and your eating habits are very important. Choosing the right foods can help ease the discomfort of GERD. WHAT GENERAL GUIDELINES DO I NEED TO FOLLOW?  Choose fruits, vegetables, whole grains, low-fat dairy products, and low-fat meat, fish, and poultry.  Limit fats such as oils, salad dressings, butter, nuts, and avocado.  Keep a food diary to identify foods that cause symptoms.  Avoid foods that cause reflux. These may be different for different people.  Eat frequent small meals instead of three large meals each day.  Eat your meals slowly, in a relaxed setting.  Limit fried foods.  Cook foods using methods other than frying.  Avoid drinking alcohol.  Avoid drinking large amounts of liquids with your meals.  Avoid bending over or lying down until 2-3 hours after eating. WHAT FOODS ARE NOT RECOMMENDED? The following are some foods and drinks that may worsen your symptoms: Vegetables Tomatoes. Tomato juice. Tomato and spaghetti sauce. Chili peppers. Onion and garlic. Horseradish. Fruits Oranges, grapefruit, and lemon (fruit and juice). Meats High-fat meats, fish, and poultry. This includes hot dogs, ribs, ham, sausage, salami, and bacon. Dairy Whole milk and chocolate milk. Sour cream. Cream. Butter. Ice cream. Cream cheese.  Beverages Coffee and tea, with or without caffeine. Carbonated beverages or energy drinks. Condiments Hot sauce. Barbecue sauce.  Sweets/Desserts Chocolate and cocoa. Donuts. Peppermint and spearmint. Fats and Oils High-fat foods, including [place] fries and potato chips. Other Vinegar.  Strong spices, such as black pepper, white pepper, red pepper, cayenne, curry powder, cloves, ginger, and chili powder. The items listed above may not be a complete list of foods and beverages to avoid. Contact your dietitian for more information. Document Released: 12/30/2004 Document Revised: 01/04/2013 Document Reviewed: 11/03/2012 [facility] Patient Information 2015 [place], [place]. This information is not intended to replace advice given to you by your health care provider. Make sure you discuss any questions you have with your health care provider.

## 2014-03-20 NOTE — Progress Notes (Signed)
Subjective:    Patient ID: Martha Woods, female    DOB: 1960/07/08, 54 y.o.   MRN: [id number]  HPI 2 days ago patient started experiencing chest pain radiating into her mid back after supper. She was eating a spicy meal at the time. She did try taking some sodium bicarbonate and thinks it did help some. It happened again yesterday about 30 min after evening meal. No nausea or vomiting or radiation of pain. No blood in the stool. No hx of stomach ulcer.  Takes 2 Aleve each morning for 6 months for back pain. Takes it after her breakfast.     Review of Systems  BP 114/74 mmHg  Pulse 86  Wt 236 lb (107.049 kg)  SpO2 97%  LMP 12/26/2011    Allergies  Allergen Reactions  . Glipizide Other (See Comments)    Hypoglycemia  . Phentermine Other (See Comments)    palpitations  . Topamax [Topiramate] Other (See Comments)    Mood change     Past Medical History  Diagnosis Date  . Cancer 02-2009    papillary thyroid cancer, Dr. Malachy Mood  . Cervical spondylosis   . Lumbar spondylosis   . Diabetes mellitus     Gestattional Diabetes, insulin dependent  . Lumbar disc herniation   . Seborrheic dermatitis   . Abnormal Pap smear     cervical cryo    Past Surgical History  Procedure Laterality Date  . Total thyroidectomy  02-2009    thyroid cancer  . Plantar fascitis  01-2010    L (Dr. Wardell Honour)  . Ett      neg for ischemia -7 mets pelvic u/s normal    History   Social History  . Marital Status: Married    Spouse Name: N/A  . Number of Children: N/A  . Years of Education: N/A   Occupational History  . Not on file.   Social History Main Topics  . Smoking status: Never Smoker   . Smokeless tobacco: Never Used  . Alcohol Use: Yes  . Drug Use: No  . Sexual Activity: Not on file   Other Topics Concern  . Not on file   Social History Narrative    Family History  Problem Relation Age of Onset  . Diabetes Mother   . Diabetes Brother   . Hypertension  Brother   . Cancer Maternal Grandmother     ovarian cancer    Outpatient Encounter Prescriptions as of 03/20/2014  Medication Sig  . amLODipine (NORVASC) 5 MG tablet Take 1 tablet (5 mg total) by mouth daily.  Marland Kitchen aspirin 81 MG tablet Take 81 mg by mouth daily.    . Cholecalciferol (VITAMIN D) 2000 UNITS tablet Take 2,000 Units by mouth.  . dapagliflozin propanediol (FARXIGA) 5 MG TABS tablet Take 5 mg by mouth daily.  Marland Kitchen lamoTRIgine (LAMICTAL) 100 MG tablet Take 1 tablet (100 mg total) by mouth 2 (two) times daily.  Marland Kitchen levothyroxine (SYNTHROID, LEVOTHROID) 200 MCG tablet Take 200 mcg by mouth daily.  Marland Kitchen lisinopril (PRINIVIL,ZESTRIL) 20 MG tablet TAKE ONE TABLET BY MOUTH ONCE DAILY  . lovastatin (MEVACOR) 20 MG tablet TAKE 1 TABLET AT BEDTIME  . nystatin (MYCOSTATIN/NYSTOP) 100000 UNIT/GM POWD 1 application TID PRN.  Marland Kitchen omeprazole (PRILOSEC) 40 MG capsule Take 1 capsule (40 mg total) by mouth daily.  . rizatriptan (MAXALT-MLT) 10 MG disintegrating tablet Take 10 mg by mouth as needed. May repeat in 2 hours if needed   . sitaGLIPtin-metformin (JANUMET) 50-1000 MG  per tablet TAKE ONE TABLET BY MOUTH TWICE DAILY WITH A MEAL.  Marland Kitchen traMADol (ULTRAM) 50 MG tablet Take 1 tablet (50 mg total) by mouth every 6 (six) hours as needed.          Objective:   Physical Exam  Constitutional: She is oriented to person, place, and time. She appears well-developed and well-nourished.  HENT:  Head: Normocephalic and atraumatic.  Cardiovascular: Normal rate, regular rhythm and normal heart sounds.   Pulmonary/Chest: Effort normal and breath sounds normal.  Abdominal: Soft. Bowel sounds are normal. She exhibits no distension and no mass. There is tenderness. There is no rebound and no guarding.  TTP over the epigastrum and the RUQ pain.   Neurological: She is alert and oriented to person, place, and time.  Skin: Skin is warm and dry.  Psychiatric: She has a normal mood and affect. Her behavior is normal.           Assessment & Plan:   epigastric pain-it's close enough to the cardiac region I do want to do an EKG today since she is over the age of 87. Though I think her symptoms are most consistent with gastritis versus GERD versus gastric ulcer versus cholecystitis. She has been taking an anti-inflammatory daily for 6 months which certainly puts her at high risk of a gastric ulcer versus gastritis. We'll go ahead and start her on PPI therapy.  Handout provided for foods to avoid.Will schedule her for ultrasound to evaluate the gallbladder. We'll check CBC with differential and CMP.   EKG today shows rate of 84 bpm, normal sinusrhythm with right axis deviation. No acute ST-T wave changes. She does have poor R wave progression in the lateral leads.

## 2014-03-22 ENCOUNTER — Ambulatory Visit (INDEPENDENT_AMBULATORY_CARE_PROVIDER_SITE_OTHER): Payer: BLUE CROSS/BLUE SHIELD

## 2014-03-22 DIAGNOSIS — R10811 Right upper quadrant abdominal tenderness: Secondary | ICD-10-CM

## 2014-03-22 DIAGNOSIS — R1013 Epigastric pain: Secondary | ICD-10-CM

## 2014-03-23 LAB — CBC WITH DIFFERENTIAL/PLATELET
Basophils Absolute: 0 10*3/uL (ref 0.0–0.1)
Basophils Relative: 0 % (ref 0–1)
Eosinophils Absolute: 0.4 10*3/uL (ref 0.0–0.7)
Eosinophils Relative: 6 % — ABNORMAL HIGH (ref 0–5)
HCT: 40 % (ref 36.0–46.0)
Hemoglobin: 12.2 g/dL (ref 12.0–15.0)
Lymphocytes Relative: 26 % (ref 12–46)
Lymphs Abs: 1.8 10*3/uL (ref 0.7–4.0)
MCH: 21.6 pg — ABNORMAL LOW (ref 26.0–34.0)
MCHC: 30.5 g/dL (ref 30.0–36.0)
MCV: 70.7 fL — ABNORMAL LOW (ref 78.0–100.0)
MPV: 9.7 fL (ref 8.6–12.4)
Monocytes Absolute: 0.6 10*3/uL (ref 0.1–1.0)
Monocytes Relative: 9 % (ref 3–12)
Neutro Abs: 4 10*3/uL (ref 1.7–7.7)
Neutrophils Relative %: 59 % (ref 43–77)
Platelets: 351 10*3/uL (ref 150–400)
RBC: 5.66 MIL/uL — ABNORMAL HIGH (ref 3.87–5.11)
RDW: 15.9 % — ABNORMAL HIGH (ref 11.5–15.5)
WBC: 6.8 10*3/uL (ref 4.0–10.5)

## 2014-03-23 LAB — COMPLETE METABOLIC PANEL WITH GFR
ALT: 11 U/L (ref 0–35)
AST: 15 U/L (ref 0–37)
Albumin: 3.9 g/dL (ref 3.5–5.2)
Alkaline Phosphatase: 64 U/L (ref 39–117)
BUN: 13 mg/dL (ref 6–23)
CO2: 30 mEq/L (ref 19–32)
Calcium: 8.5 mg/dL (ref 8.4–10.5)
Chloride: 98 mEq/L (ref 96–112)
Creat: 0.61 mg/dL (ref 0.50–1.10)
GFR, Est African American: >89 mL/min
GFR, Est Non African American: >89 mL/min
Glucose, Bld: 95 mg/dL (ref 70–99)
Potassium: 4.3 mEq/L (ref 3.5–5.3)
Sodium: 138 mEq/L (ref 135–145)
Total Bilirubin: 0.4 mg/dL (ref 0.2–1.2)
Total Protein: 6.8 g/dL (ref 6.0–8.3)

## 2014-03-24 LAB — FERRITIN: Ferritin: 10 ng/mL (ref 10–291)

## 2014-03-30 NOTE — Addendum Note (Signed)
Addended by: Narda Rutherford on: 03/30/2014 01:43 PM   Modules accepted: Orders

## 2014-05-12 ENCOUNTER — Other Ambulatory Visit: Payer: Self-pay | Admitting: Family Medicine

## 2014-05-25 ENCOUNTER — Encounter: Payer: Self-pay | Admitting: Family Medicine

## 2014-05-25 ENCOUNTER — Ambulatory Visit (INDEPENDENT_AMBULATORY_CARE_PROVIDER_SITE_OTHER): Payer: BLUE CROSS/BLUE SHIELD | Admitting: Family Medicine

## 2014-05-25 VITALS — BP 111/72 | HR 91 | Ht 65.0 in | Wt 226.0 lb

## 2014-05-25 DIAGNOSIS — Z1211 Encounter for screening for malignant neoplasm of colon: Secondary | ICD-10-CM | POA: Diagnosis not present

## 2014-05-25 DIAGNOSIS — R790 Abnormal level of blood mineral: Secondary | ICD-10-CM | POA: Diagnosis not present

## 2014-05-25 DIAGNOSIS — E119 Type 2 diabetes mellitus without complications: Secondary | ICD-10-CM | POA: Diagnosis not present

## 2014-05-25 LAB — POCT GLYCOSYLATED HEMOGLOBIN (HGB A1C): Hemoglobin A1C: 6.9

## 2014-05-25 MED ORDER — LISINOPRIL 20 MG PO TABS: ORAL_TABLET | ORAL | Status: DC

## 2014-05-25 MED ORDER — LOVASTATIN 20 MG PO TABS: ORAL_TABLET | ORAL | Status: DC

## 2014-05-25 MED ORDER — DAPAGLIFLOZIN PROPANEDIOL 5 MG PO TABS: 5.0000 mg | ORAL_TABLET | Freq: Every day | ORAL | Status: DC

## 2014-05-25 NOTE — Progress Notes (Signed)
   Subjective:    Patient ID: Martha Woods, female    DOB: 1960/02/06, 54 y.o.   MRN: [id number]  HPI Diabetes - no hypoglycemic events. No wounds or sores that are not healing well. No increased thirst or urination. Checking glucose at home.  Sugars running 112-120.  Taking medications as prescribed without any side effects. Eye exam due in June.  She is on [place].   Hypertension- Pt denies chest pain, SOB, dizziness, or heart palpitations.  Taking meds as directed w/o problems.  Denies medication side effects.    Recently had lab work to check her ferritin level was very low. It was 10. Had recommended that she start over-the-counter iron twice a day and it too constipating decreased him to once a day. She has not had a screening colonoscopy. She does have thaleseemia.   Review of Systems     Objective:   Physical Exam  Constitutional: She is oriented to person, place, and time. She appears well-developed and well-nourished.  HENT:  Head: Normocephalic and atraumatic.  Cardiovascular: Normal rate, regular rhythm and normal heart sounds.   Pulmonary/Chest: Effort normal and breath sounds normal.  Neurological: She is alert and oriented to person, place, and time.  Skin: Skin is warm and dry.  Psychiatric: She has a normal mood and affect. Her behavior is normal.          Assessment & Plan:  DM- .  Eye exam scheduled for June. Well controlled.  She has lost 10 lbs since last here. Keep up the good work. F/U in 16months.   HTN - well controlled continue current regimen.  Low ferritin/iron deficiency- on daily iron.  Will recheck in one month.

## 2014-05-25 NOTE — Addendum Note (Signed)
Addended by: Teddy Spike on: 05/25/2014 09:14 AM   Modules accepted: Orders

## 2014-06-07 ENCOUNTER — Emergency Department
Admission: EM | Admit: 2014-06-07 | Discharge: 2014-06-07 | Disposition: A | Discharge status: Home / Self Care | Payer: BLUE CROSS/BLUE SHIELD | Source: Home / Self Care | Attending: Family Medicine | Admitting: Family Medicine

## 2014-06-07 ENCOUNTER — Encounter: Payer: Self-pay | Admitting: *Deleted

## 2014-06-07 DIAGNOSIS — M654 Radial styloid tenosynovitis [de Quervain]: Secondary | ICD-10-CM | POA: Diagnosis not present

## 2014-06-07 MED ORDER — MELOXICAM 15 MG PO TABS: 15.0000 mg | ORAL_TABLET | Freq: Every day | ORAL | Status: DC

## 2014-06-07 NOTE — ED Provider Notes (Signed)
CSN: [id number]     Arrival date & time 06/07/14  1937 History   First MD Initiated Contact with Patient 06/07/14 1013     Chief Complaint  Patient presents with  . Hand Pain    left hand      HPI Comments: Patient complains of gradually increasing pain in her left thumb for 3 days.  She recalls no injury or change in activities.  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  She has a past history of left carpal tunnel syndrome treated in the past with steroid injection.  Patient is a 54 y.o. female presenting with hand pain. The history is provided by the patient.  Hand Pain This is a new problem. Episode onset: 3 days ago. The problem occurs constantly. The problem has been gradually worsening. Exacerbated by: movement of left thumb. Nothing relieves the symptoms. Treatments tried: Aleve. The treatment provided mild relief.    Past Medical History  Diagnosis Date  . Cancer 02-2009    papillary thyroid cancer, Dr. Malachy Mood  . Cervical spondylosis   . Lumbar spondylosis   . Diabetes mellitus     Gestattional Diabetes, insulin dependent  . Lumbar disc herniation   . Seborrheic dermatitis   . Abnormal Pap smear     cervical cryo   Past Surgical History  Procedure Laterality Date  . Total thyroidectomy  02-2009    thyroid cancer  . Plantar fascitis  01-2010    L (Dr. Wardell Honour)  . Ett      neg for ischemia -7 mets pelvic u/s normal   Family History  Problem Relation Age of Onset  . Diabetes Mother   . Diabetes Brother   . Hypertension Brother   . Cancer Maternal Grandmother     ovarian cancer   History  Substance Use Topics  . Smoking status: Never Smoker   . Smokeless tobacco: Never Used  . Alcohol Use: Yes   OB History    Gravida Para Term Preterm AB TAB SAB Ectopic Multiple Living   3 2 2  1  1   2      Review of Systems  All other systems reviewed and are negative.   Allergies  Glipizide; Phentermine; and Topamax  Home Medications   Prior  to Admission medications   Medication Sig Start Date End Date Taking? Authorizing Provider  amLODipine (NORVASC) 5 MG tablet Take 1 tablet (5 mg total) by mouth daily. 02/02/13   Hali Marry, MD  aspirin 81 MG tablet Take 81 mg by mouth daily.      Historical Provider, MD  Cholecalciferol (VITAMIN D) 2000 UNITS tablet Take 2,000 Units by mouth.    Historical Provider, MD  dapagliflozin propanediol (FARXIGA) 5 MG TABS tablet Take 5 mg by mouth daily. 05/25/14   Hali Marry, MD  lamoTRIgine (LAMICTAL) 100 MG tablet Take 1 tablet (100 mg total) by mouth 2 (two) times daily. 01/20/13   Hali Marry, MD  levothyroxine (SYNTHROID, LEVOTHROID) 200 MCG tablet Take 200 mcg by mouth daily. 11/19/10   Hali Marry, MD  lisinopril (PRINIVIL,ZESTRIL) 20 MG tablet TAKE ONE TABLET BY MOUTH ONCE DAILY 05/25/14   Hali Marry, MD  lovastatin (MEVACOR) 20 MG tablet TAKE 1 TABLET AT BEDTIME 05/25/14   Hali Marry, MD  meloxicam (MOBIC) 15 MG tablet Take 1 tablet (15 mg total) by mouth daily. Take with food each morning 06/07/14   Kandra Nicolas, MD  nystatin (MYCOSTATIN/NYSTOP)  100000 UNIT/GM POWD 1 application TID PRN. 1/50/56   Hali Marry, MD  omeprazole (PRILOSEC) 40 MG capsule Take 1 capsule (40 mg total) by mouth daily. 03/20/14   Hali Marry, MD  rizatriptan (MAXALT-MLT) 10 MG disintegrating tablet Take 10 mg by mouth as needed. May repeat in 2 hours if needed     Historical Provider, MD  sitaGLIPtin-metformin (JANUMET) 50-1000 MG per tablet TAKE ONE TABLET BY MOUTH TWICE DAILY WITH A MEAL. 12/20/13   Hali Marry, MD  traMADol (ULTRAM) 50 MG tablet Take 1 tablet (50 mg total) by mouth every 6 (six) hours as needed. 07/18/10   Collene Leyden Bowen, DO   BP 130/82 mmHg  Pulse 88  Resp 16  Wt 227 lb (102.967 kg)  SpO2 100%  LMP 12/26/2011 Physical Exam  Constitutional: She is oriented to person, place, and time. She appears well-developed and  well-nourished. No distress.  Patient is obese  HENT:  Head: Normocephalic.  Eyes: Pupils are equal, round, and reactive to light.  Musculoskeletal:       Left hand: She exhibits decreased range of motion, tenderness and swelling. She exhibits no bony tenderness, normal two-point discrimination, normal capillary refill, no deformity and no laceration. Normal sensation noted. Decreased strength noted. She exhibits thumb/finger opposition.       Hands: Left thumb/wrist dorsally have distinct tenderness over the extensor tendons.  Palpation there during resisted extension and abduction of the thumb recreates her pain.  Distal neurovascular function is intact.    Neurological: She is alert and oriented to person, place, and time.  Skin: Skin is warm and dry.  Nursing note and vitals reviewed.   ED Course  Procedures  none   MDM   1. De Quervain's tenosynovitis, left    Thumb spica splint applied.  Begin Mobic 15mg  daily.  Apply ice pack for 20 to 30 minutes, 3 to 4 times daily  Continue until pain decreases.  Wear brace until improved.  Begin thumb exercises as tolerated. Followup with Dr. Aundria Mems ([facility]) if not improving about two weeks.     Kandra Nicolas, MD 06/07/14 1040

## 2014-06-07 NOTE — ED Notes (Signed)
Pt c/o left hand pain without injury x 2-3 days. H/o carpal tunnel

## 2014-06-07 NOTE — Discharge Instructions (Signed)
Apply ice pack for 20 to 30 minutes, 3 to 4 times daily  Continue until pain decreases.  Wear brace until improved.  Begin thumb exercises as tolerated.    De Quervain's Disease Harriet Pho disease is a condition often seen in racquet sports where there is a soreness (inflammation) in the cord like structures (tendons) which attach muscle to bone on the thumb side of the wrist. There may be a tightening of the tissuesaround the tendons. This condition is often helped by giving up or modifying the activity which caused it. When conservative treatment does not help, surgery may be required. Conservative treatment could include changes in the activity which brought about the problem or made it worse. Anti-inflammatory medications and injections may be used to help decrease the inflammation and help with pain control. Your caregiver will help you determine which is best for you. DIAGNOSIS  Often the diagnosis (learning what is wrong) can be made by examination. Sometimes x-rays are required. HOME CARE INSTRUCTIONS   Apply ice to the sore area for 15-20 minutes, 03-04 times per day while awake. Put the ice in a plastic bag and place a towel between the bag of ice and your skin. This is especially helpful if it can be done after all activities involving the sore wrist.  Temporary splinting may help.  Only take over-the-counter or prescription medicines for pain, discomfort or fever as directed by your caregiver. SEEK MEDICAL CARE IF:   Pain relief is not obtained with medications, or if you have increasing pain and seem to be getting worse rather than better. MAKE SURE YOU:   Understand these instructions.  Will watch your condition.  Will get help right away if you are not doing well or get worse. Document Released: 09/24/2000 Document Revised: 03/24/2011 Document Reviewed: 05/04/2013 [facility] Patient Information 2015 [place], [place]. This information is not intended to replace advice given to  you by your health care provider. Make sure you discuss any questions you have with your health care provider.

## 2014-08-02 ENCOUNTER — Telehealth: Payer: Self-pay | Admitting: *Deleted

## 2014-08-02 NOTE — Telephone Encounter (Signed)
lvm for [name] in MR to refax the rest of her OV notes.[name]

## 2014-09-05 LAB — HM DIABETES EYE EXAM

## 2014-09-07 ENCOUNTER — Ambulatory Visit: Payer: BLUE CROSS/BLUE SHIELD | Admitting: Family Medicine

## 2014-09-30 ENCOUNTER — Other Ambulatory Visit: Payer: Self-pay | Admitting: Family Medicine

## 2014-10-11 ENCOUNTER — Ambulatory Visit (INDEPENDENT_AMBULATORY_CARE_PROVIDER_SITE_OTHER): Payer: BLUE CROSS/BLUE SHIELD

## 2014-10-11 ENCOUNTER — Ambulatory Visit (INDEPENDENT_AMBULATORY_CARE_PROVIDER_SITE_OTHER): Payer: BLUE CROSS/BLUE SHIELD | Admitting: Family Medicine

## 2014-10-11 ENCOUNTER — Encounter: Payer: Self-pay | Admitting: Family Medicine

## 2014-10-11 VITALS — BP 113/72 | HR 86 | Ht 65.0 in | Wt 227.0 lb

## 2014-10-11 DIAGNOSIS — E119 Type 2 diabetes mellitus without complications: Secondary | ICD-10-CM

## 2014-10-11 DIAGNOSIS — I10 Essential (primary) hypertension: Secondary | ICD-10-CM

## 2014-10-11 DIAGNOSIS — M25571 Pain in right ankle and joints of right foot: Secondary | ICD-10-CM | POA: Diagnosis not present

## 2014-10-11 LAB — POCT UA - MICROALBUMIN
Albumin/Creatinine Ratio, Urine, POC: <30
Creatinine, POC: 10 mg/dL
Microalbumin Ur, POC: 10 mg/L

## 2014-10-11 LAB — POCT GLYCOSYLATED HEMOGLOBIN (HGB A1C): Hemoglobin A1C: 6.7

## 2014-10-11 MED ORDER — LOVASTATIN 20 MG PO TABS: ORAL_TABLET | ORAL | Status: DC

## 2014-10-11 MED ORDER — DAPAGLIFLOZIN PROPANEDIOL 5 MG PO TABS: 5.0000 mg | ORAL_TABLET | Freq: Every day | ORAL | Status: DC

## 2014-10-11 MED ORDER — LISINOPRIL 20 MG PO TABS: ORAL_TABLET | ORAL | Status: DC

## 2014-10-11 NOTE — Progress Notes (Signed)
   Subjective:    Patient ID: Martha Woods, female    DOB: 04/19/1960, 54 y.o.   MRN: [id number]  HPI Diabetes - no hypoglycemic events. No wounds or sores that are not healing well. No increased thirst or urination. Checking glucose at home. Taking medications as prescribed without any side effects.  Right medial ankle pain x 2 months. No injury per se. Has been swelling. Has been using an ACE wrap. Better in AM and worse with activity.   Hypertension- Pt denies chest pain, SOB, dizziness, or heart palpitations.  Taking meds as directed w/o problems.  Denies medication side effects.     Review of Systems     Objective:   Physical Exam  Constitutional: She is oriented to person, place, and time. She appears well-developed and well-nourished.  HENT:  Head: Normocephalic and atraumatic.  Cardiovascular: Normal rate, regular rhythm and normal heart sounds.   Pulmonary/Chest: Effort normal and breath sounds normal.  Musculoskeletal:  She is tender over the right medial malleolus and along the posterior border of the medial malleolus on the right foot. Normal range of motion of the ankle. She does have some trace edema. Strength is 5 out of 5 in all directions. Dorsal pedal pulse 2+.  Neurological: She is alert and oriented to person, place, and time.  Skin: Skin is warm and dry.  Psychiatric: She has a normal mood and affect. Her behavior is normal.          Assessment & Plan:  DM- well-controlled. Hemoglobin A1c is down to 6.7. She says she really hasn't been eating the best but plans on getting back on track. That should reduce A1c even further which would be fantastic. She would due for BMP at the next office visit. She did have our eye exam performed about 5 [facility] ago [facility]. Will call to get report.  HTN - well controlled. Continue current regimen.  Right medial ankle pain - because she is tender on exam and pain has persisted for 2 months at this  point I would like to go ahead and get an x-ray for further evaluation. If negative for fracture then consider an ASO for extra support and compression.

## 2014-10-24 ENCOUNTER — Telehealth: Payer: Self-pay | Admitting: *Deleted

## 2014-10-24 NOTE — Telephone Encounter (Signed)
Pt coming by later to get larger ASO.Martha Woods [place]

## 2014-11-23 ENCOUNTER — Encounter: Payer: Self-pay | Admitting: Family Medicine

## 2014-12-28 ENCOUNTER — Encounter: Payer: Self-pay | Admitting: Osteopathic Medicine

## 2014-12-28 ENCOUNTER — Ambulatory Visit (INDEPENDENT_AMBULATORY_CARE_PROVIDER_SITE_OTHER): Payer: BLUE CROSS/BLUE SHIELD | Admitting: Osteopathic Medicine

## 2014-12-28 VITALS — BP 134/72 | HR 87 | Temp 97.9°F | Ht 64.0 in | Wt 224.0 lb

## 2014-12-28 DIAGNOSIS — J029 Acute pharyngitis, unspecified: Secondary | ICD-10-CM

## 2014-12-28 DIAGNOSIS — J069 Acute upper respiratory infection, unspecified: Secondary | ICD-10-CM

## 2014-12-28 DIAGNOSIS — B9789 Other viral agents as the cause of diseases classified elsewhere: Secondary | ICD-10-CM

## 2014-12-28 LAB — POCT RAPID STREP A (OFFICE): Rapid Strep A Screen: NEGATIVE

## 2014-12-28 MED ORDER — BENZONATATE 200 MG PO CAPS: 200.0000 mg | ORAL_CAPSULE | Freq: Three times a day (TID) | ORAL | Status: DC | PRN

## 2014-12-28 MED ORDER — IPRATROPIUM BROMIDE 0.03 % NA SOLN: 2.0000 | Freq: Two times a day (BID) | NASAL | Status: DC

## 2014-12-28 NOTE — Patient Instructions (Signed)
See printed instructions for medications to use at home to help treat your symptoms while your body fights the cold virus. Anything highlighted you can take together and with your other prescriptions medications.

## 2014-12-28 NOTE — Progress Notes (Signed)
HPI: Martha Woods is a 54 y.o. female who presents to [facility] today for chief complaint of:  Chief Complaint  Patient presents with  . Sore Throat  . Sinus Problem    . Location: sinus and throat . Quality: coughing, sore throat, nasal congestion started today  . Severity: moderate . Duration: yesterday . Modifying factors: no OTC meds tried  . Assoc signs/symptoms: (+) subjective fever   MODIFIED CENTOR CRITERIA (ponts if "yes"): Tonsillar exudate (1): no Tender Ant Cervical LN (1): no Absence of cough (1): no Fever (1): no Age - 3-14 (1): no, 15-45 (0): no, >/= 45 (-1): yes SCORE: -1    Past medical, social and family history reviewed: Past Medical History  Diagnosis Date  . Cancer 02-2009    papillary thyroid cancer, Dr. Malachy Mood  . Cervical spondylosis   . Lumbar spondylosis   . Diabetes mellitus     Gestattional Diabetes, insulin dependent  . Lumbar disc herniation   . Seborrheic dermatitis   . Abnormal Pap smear     cervical cryo   Past Surgical History  Procedure Laterality Date  . Total thyroidectomy  02-2009    thyroid cancer  . Plantar fascitis  01-2010    L (Dr. Wardell Honour)  . Ett      neg for ischemia -7 mets pelvic u/s normal   Social History  Substance Use Topics  . Smoking status: Never Smoker   . Smokeless tobacco: Never Used  . Alcohol Use: Yes   Family History  Problem Relation Age of Onset  . Diabetes Mother   . Diabetes Brother   . Hypertension Brother   . Cancer Maternal Grandmother     ovarian cancer    Current Outpatient Prescriptions  Medication Sig Dispense Refill  . amLODipine (NORVASC) 5 MG tablet Take 1 tablet (5 mg total) by mouth daily. 90 tablet 3  . aspirin 81 MG tablet Take 81 mg by mouth daily.      . Cholecalciferol (VITAMIN D) 2000 UNITS tablet Take 2,000 Units by mouth.    . dapagliflozin propanediol (FARXIGA) 5 MG TABS tablet Take 5 mg by mouth daily. 30 tablet  11  . lamoTRIgine (LAMICTAL) 100 MG tablet Take 1 tablet (100 mg total) by mouth 2 (two) times daily. 60 tablet 0  . levothyroxine (SYNTHROID, LEVOTHROID) 200 MCG tablet Take 200 mcg by mouth daily.    Marland Kitchen lisinopril (PRINIVIL,ZESTRIL) 20 MG tablet TAKE ONE TABLET BY MOUTH ONCE DAILY 180 tablet 2  . lovastatin (MEVACOR) 20 MG tablet TAKE 1 TABLET AT BEDTIME 90 tablet 2  . nystatin (MYCOSTATIN/NYSTOP) 100000 UNIT/GM POWD 1 application TID PRN. 180 g 3  . rizatriptan (MAXALT-MLT) 10 MG disintegrating tablet Take 10 mg by mouth as needed. May repeat in 2 hours if needed     . sitaGLIPtin-metformin (JANUMET) 50-1000 MG per tablet TAKE 1 TABLET BY MOUTH  TWICE DAILY WITH A MEAL. Due for follow up appointment. 180 tablet 0  . traMADol (ULTRAM) 50 MG tablet Take 1 tablet (50 mg total) by mouth every 6 (six) hours as needed. 90 tablet 0   No current facility-administered medications for this visit.   Allergies  Allergen Reactions  . Glipizide Other (See Comments)    Hypoglycemia  . Phentermine Other (See Comments)    palpitations  . Topamax [Topiramate] Other (See Comments)    Mood change       Review of Systems: CONSTITUTIONAL:  subjective fever, no chills,  No  unintentional weight changes HEAD/EYES/EARS/NOSE/THROAT: Yes  headache, no vision change, no hearing change, Yes  sore throat, Yes  sinus pressure CARDIAC: No  chest pain, No  pressure, No palpitations, No  orthopnea RESPIRATORY: Yes  cough, No  shortness of breath/wheeze GASTROINTESTINAL: No  nausea, No  vomiting, No  abdominal pain,    Exam:  BP 134/72 mmHg  Pulse 87  Temp(Src) 97.9 F (36.6 C) (Oral)  Ht 5\' 4"  (1.626 m)  Wt 224 lb (101.606 kg)  BMI 38.43 kg/m2  LMP 12/26/2011 Constitutional: VS see above. General Appearance: alert, well-developed, well-nourished, NAD Eyes: Normal lids and conjunctive, non-icteric sclera, PERRLA Ears, Nose, Mouth, Throat: MMM, Normal external inspection ears/nares/mouth/lips/gums, TM  normal, posterior pharynx No  erythema No  exudate Neck: No masses, trachea midline. No thyroid enlargement/tenderness/mass appreciated. No lymphadenopathy Respiratory: Normal respiratory effort. no wheeze, no rhonchi, no rales Cardiovascular: S1/S2 normal, no murmur, no rub/gallop auscultated. RRR.     Results for orders placed or performed in visit on 12/28/14 (from the past 72 hour(s))  POCT rapid strep A     Status: None   Collection Time: 12/28/14 10:02 AM  Result Value Ref Range   Rapid Strep A Screen Negative Negative      ASSESSMENT/PLAN: OTC meds ok to take Saline spray, antihistamine, Tylenol, lozenges/honey for sore throat and cough. No Abx needed now.   Sore throat - Plan: POCT rapid strep A  Viral URI with cough - Plan: benzonatate (TESSALON) 200 MG capsule, ipratropium (ATROVENT) 0.03 % nasal spray   Return if symptoms worsen or fail to improve.

## 2014-12-30 ENCOUNTER — Other Ambulatory Visit: Payer: Self-pay | Admitting: Family Medicine

## 2015-01-10 ENCOUNTER — Ambulatory Visit: Payer: BLUE CROSS/BLUE SHIELD | Admitting: Family Medicine

## 2015-01-17 ENCOUNTER — Encounter: Payer: Self-pay | Admitting: Family Medicine

## 2015-01-17 ENCOUNTER — Ambulatory Visit (INDEPENDENT_AMBULATORY_CARE_PROVIDER_SITE_OTHER): Payer: BLUE CROSS/BLUE SHIELD | Admitting: Family Medicine

## 2015-01-17 VITALS — BP 121/61 | HR 98 | Wt 226.0 lb

## 2015-01-17 DIAGNOSIS — E039 Hypothyroidism, unspecified: Secondary | ICD-10-CM | POA: Diagnosis not present

## 2015-01-17 DIAGNOSIS — Z114 Encounter for screening for human immunodeficiency virus [HIV]: Secondary | ICD-10-CM

## 2015-01-17 DIAGNOSIS — E785 Hyperlipidemia, unspecified: Secondary | ICD-10-CM

## 2015-01-17 DIAGNOSIS — Z1159 Encounter for screening for other viral diseases: Secondary | ICD-10-CM

## 2015-01-17 DIAGNOSIS — E119 Type 2 diabetes mellitus without complications: Secondary | ICD-10-CM | POA: Diagnosis not present

## 2015-01-17 LAB — POCT GLYCOSYLATED HEMOGLOBIN (HGB A1C): Hemoglobin A1C: 6.9

## 2015-01-17 MED ORDER — SITAGLIPTIN PHOS-METFORMIN HCL 50-1000 MG PO TABS: 1.0000 | ORAL_TABLET | Freq: Two times a day (BID) | ORAL | Status: DC

## 2015-01-17 NOTE — Progress Notes (Signed)
   Subjective:    Patient ID: Martha Woods, female    DOB: 07-22-1960, 55 y.o.   MRN: [id number]  HPI Diabetes - no hypoglycemic events. No wounds or sores that are not healing well. No increased thirst or urination. Checking glucose at home. Taking medications as prescribed without any side effects. Last A1C of 6.7.    Hypertension- Pt denies chest pain, SOB, dizziness, or heart palpitations.  Taking meds as directed w/o problems.  Denies medication side effects.    Hyperlipidemia  -  Doing well on Lovastatin.  No myalgias or side effects.    Had flu shot done at work.   Review of Systems     Objective:   Physical Exam  Constitutional: She is oriented to person, place, and time. She appears well-developed and well-nourished.  HENT:  Head: Normocephalic and atraumatic.  Cardiovascular: Normal rate, regular rhythm and normal heart sounds.   Pulmonary/Chest: Effort normal and breath sounds normal.  Neurological: She is alert and oriented to person, place, and time.  Skin: Skin is warm and dry.  Psychiatric: She has a normal mood and affect. Her behavior is normal.          Assessment & Plan:  DM-  At goal but up form last time. Work on diet and exercise. F/U in 3 months.    HTN - Well controlled.    Hyperlipidemia - due to recheck lipids.      DUE FOR SCREEN hep c and hiv

## 2015-01-23 ENCOUNTER — Encounter: Payer: Self-pay | Admitting: Family Medicine

## 2015-01-23 ENCOUNTER — Ambulatory Visit (INDEPENDENT_AMBULATORY_CARE_PROVIDER_SITE_OTHER): Payer: BLUE CROSS/BLUE SHIELD | Admitting: Family Medicine

## 2015-01-23 VITALS — BP 127/75 | HR 80 | Wt 225.0 lb

## 2015-01-23 DIAGNOSIS — M76821 Posterior tibial tendinitis, right leg: Secondary | ICD-10-CM | POA: Diagnosis not present

## 2015-01-23 MED ORDER — DICLOFENAC SODIUM 2 % TD SOLN: 1.0000 "application " | Freq: Four times a day (QID) | TRANSDERMAL | Status: DC | PRN

## 2015-01-23 NOTE — Patient Instructions (Signed)
Thank you for coming in today. Attend PT.  Apply the pennsaid solution for pain.  Return for orthotics.  Do home exercises.   Posterior Tibial Tendon Tendinitis With Rehab Tendonitis is a condition that is characterized by inflammation of a tendon or the lining (sheath) that surrounds it. The inflammation is usually caused by damage to the tendon, such as a tendon tear (strain). Sprains are classified into three categories. Grade 1 sprains cause pain, but the tendon is not lengthened. Grade 2 sprains include a lengthened ligament due to the ligament being stretched or partially ruptured. With grade 2 sprains there is still function, although the function may be diminished. Grade 3 sprains are characterized by a complete tear of the tendon or muscle, and function is usually impaired. Posterior tibialis tendonitis is tendonitis of the posterior tibial tendon, which attaches muscles of the lower leg to the foot. The posterior tibial tendon is located in the back of the ankle and helps the body straighten (plantar flex) and rotate inward (medially rotate) the ankle. SYMPTOMS   Pain, tenderness, swelling, warmth, and/or redness over the back of the inner ankle at the posterior tibial tendon or the inner part of the mid-foot.  Pain that worsens with plantar flexion or medial rotation of the ankle.  A crackling sound (crepitation) when the tendon is moved or touched. CAUSES  Posterior tibial tendonitis occurs when damage to the posterior tibial tendon starts an inflammatory response. Common mechanisms of injury include:  Degenerative (occurs with aging) processes that weaken the tendon and make it more susceptible to injury.  Stress placed on the tendon from an increase in the intensity, frequency, or duration of training.  Direct trauma to the ankle.  Returning to activity before a previous ankle injury is allowed to heal. RISK INCREASES WITH:  Activities that involve repetitive and/or stressful  plantar flexion (jumping, kicking, or running up/down hills).  Poor strength and flexibility.  Flat feet.  Previous injury to the foot, ankle, or leg. PREVENTION   Warm up and stretch properly before activity.  Allow for adequate recovery between workouts.  Maintain physical fitness:  Strength, flexibility, and endurance.  Cardiovascular fitness.  Learn and use proper technique. When possible, have a coach correct improper technique.  Complete rehabilitation from a previous foot, ankle, or leg injury.  If you have flat feet, wear arch supports (orthotics). PROGNOSIS  If treated properly, the symptoms of tendonitis usually resolve within 6 weeks. This period may be shorter for injuries caused by direct trauma. RELATED COMPLICATIONS   Prolonged healing time, if improperly treated or reinjured.  Recurrent symptoms that result in a chronic problem.  Partial or complete tendon tear (rupture) requiring surgery. TREATMENT  Treatment initially involves the use of ice and medication to help reduce pain and inflammation. The use of strengthening and stretching exercises may help reduce pain with activity. These exercises may be performed at home or with referral to a therapist. Often times, your caregiver will recommend immobilizing the ankle to allow the tendon to heal. If you have flat feet, you may be advised to wear orthotic arch supports. If symptoms persist for greater than 6 months despite nonsurgical (conservative) treatment, then surgery may be recommended. MEDICATION   If pain medication is necessary, then nonsteroidal anti-inflammatory medications, such as aspirin and ibuprofen, or other minor pain relievers, such as acetaminophen, are often recommended.  Do not take pain medication for 7 days before surgery.  Prescription pain relievers may be given if deemed necessary by  your caregiver. Use only as directed and only as much as you need.  Corticosteroid injections may be  given by your caregiver. These injections should be reserved for the most serious cases because they may only be given a certain number of times. HEAT AND COLD  Cold treatment (icing) relieves pain and reduces inflammation. Cold treatment should be applied for 10 to 15 minutes every 2 to 3 hours for inflammation and pain and immediately after any activity that aggravates your symptoms. Use ice packs or massage the area with a piece of ice (ice massage).  Heat treatment may be used prior to performing the stretching and strengthening activities prescribed by your caregiver, physical therapist, or athletic trainer. Use a heat pack or soak the injury in warm water. SEEK MEDICAL CARE IF:  Treatment seems to offer no benefit, or the condition worsens.  Any medications produce adverse side effects. EXERCISES RANGE OF MOTION (ROM) AND STRETCHING EXERCISES - Posterior Tibial Tendon Tendinitis These exercises may help you when beginning to rehabilitate your injury. Your symptoms may resolve with or without further involvement from your physician, physical therapist or athletic trainer. While completing these exercises, remember:   Restoring tissue flexibility helps normal motion to return to the joints. This allows healthier, less painful movement and activity.  An effective stretch should be held for at least 30 seconds.  A stretch should never be painful. You should only feel a gentle lengthening or release in the stretched tissue. RANGE OF MOTION - Ankle Plantar Flexion   Sit with your right / left leg crossed over your opposite knee.  Use your opposite hand to pull the top of your foot and toes toward you.  You should feel a gentle stretch on the top of your foot/ankle. Hold this position for __________ seconds. Repeat __________ times. Complete this exercise __________ times per day.  RANGE OF MOTION - Ankle Eversion   Sit with your right / left ankle crossed over your opposite  knee.  Grip your foot with your opposite hand, placing your thumb on the top of your foot and your fingers across the bottom of your foot.  Gently push your foot downward with a slight rotation so your littlest toes rise slightly.  You should feel a gentle stretch on the inside of your ankle. Hold the stretch for __________ seconds. Repeat __________ times. Complete this exercise __________ times per day.  RANGE OF MOTION - Ankle Inversion   Sit with your right / left ankle crossed over your opposite knee.  Grip your foot with your opposite hand, placing your thumb on the bottom of your foot and your fingers across the top of your foot.  Gently pull your foot so the smallest toe comes toward you and your thumb pushes the inside of the ball of your foot away from you.  You should feel a gentle stretch on the outside of your ankle. Hold the stretch for __________ seconds. Repeat __________ times. Complete this exercise __________ times per day.  RANGE OF MOTION - Dorsi/Plantar Flexion  While sitting with your right / left knee straight, draw the top of your foot upward by flexing your ankle. Then reverse the motion, pointing your toes downward.  Hold each position for __________ seconds.  After completing your first set of exercises, repeat this exercise with your knee bent. Repeat __________ times. Complete this exercise __________ times per day.  RANGE OF MOTION - Ankle Alphabet  Imagine your right / left big toe is a  pen.  Keeping your hip and knee still, write out the entire alphabet with your "pen." Make the letters as large as you can without increasing any discomfort. Repeat __________ times. Complete this exercise __________ times per day.  STRETCH - Gastrocsoleus   Sit with your right / left leg extended. Holding onto both ends of a belt or towel, loop it around the ball of your foot.  Keeping your right / left ankle and foot relaxed and your knee straight, pull your foot  and ankle toward you using the belt/towel.  You should feel a gentle stretch behind your calf or knee. Hold this position for __________ seconds. Repeat __________ times. Complete this exercise __________ times per day.  STRETCH - Gastroc, Standing   Place hands on wall.  Extend right / left leg, keeping the front knee somewhat bent.  Slightly point your toes inward on your back foot.  Keeping your right / left heel on the floor and your knee straight, shift your weight toward the wall, not allowing your back to arch.  You should feel a gentle stretch in the right / left calf. Hold this position for __________ seconds. Repeat __________ times. Complete this stretch __________ times per day. STRETCH - Soleus, Standing   Place hands on wall.  Extend right / left leg, keeping the other knee somewhat bent.  Slightly point your toes inward on your back foot.  Keep your right / left heel on the floor, bend your back knee, and slightly shift your weight over the back leg so that you feel a gentle stretch deep in your back calf.  Hold this position for __________ seconds. Repeat __________ times. Complete this stretch __________ times per day. STRENGTHENING EXERCISES - Posterior Tibial Tendon Tendinitis These exercises may help you when beginning to rehabilitate your injury. They may resolve your symptoms with or without further involvement from your physician, physical therapist, or athletic trainer. While completing these exercises, remember:   Muscles can gain both the endurance and the strength needed for everyday activities through controlled exercises.  Complete these exercises as instructed by your physician, physical therapist, or athletic trainer. Progress the resistance and repetitions only as guided. STRENGTH - Dorsiflexors  Secure a rubber exercise band/tubing to a fixed object (i.e., table, pole) and loop the other end around your right / left foot.  Sit on the floor facing  the fixed object. The band/tubing should be slightly tense when your foot is relaxed.  Slowly draw your foot back toward you using your ankle and toes.  Hold this position for __________ seconds. Slowly release the tension in the band and return your foot to the starting position. Repeat __________ times. Complete this exercise __________ times per day.  STRENGTH - Towel Curls  Sit in a chair positioned on a non-carpeted surface.  Place your foot on a towel, keeping your heel on the floor.  Pull the towel toward your heel by only curling your toes. Keep your heel on the floor.  If instructed by your physician, physical therapist, or athletic trainer, add ____________________ at the end of the towel. Repeat __________ times. Complete this exercise __________ times per day. STRENGTH - Ankle Eversion   Secure one end of a rubber exercise band/tubing to a fixed object (table, pole). Loop the other end around your foot just before your toes.  Place your fists between your knees. This will focus your strengthening at your ankle.  Drawing the band/tubing across your opposite foot, slowly pull your  little toe out and up. Make sure the band/tubing is positioned to resist the entire motion.  Hold this position for __________ seconds.  Have your muscles resist the band/tubing as it slowly pulls your foot back to the starting position. Repeat __________ times. Complete this exercise __________ times per day.  STRENGTH - Ankle Inversion   Secure one end of a rubber exercise band/tubing to a fixed object (table, pole). Loop the other end around your foot just before your toes.  Place your fists between your knees. This will focus your strengthening at your ankle.  Slowly, pull your big toe up and in, making sure the band/tubing is positioned to resist the entire motion.  Hold this position for __________ seconds.  Have your muscles resist the band/tubing as it slowly pulls your foot back to  the starting position. Repeat __________ times. Complete this exercises __________ times per day.    This information is not intended to replace advice given to you by your health care provider. Make sure you discuss any questions you have with your health care provider.   Document Released: 12/30/2004 Document Revised: 05/16/2014 Document Reviewed: 04/13/2008 Elsevier Interactive Patient Education [employer].

## 2015-01-23 NOTE — Progress Notes (Signed)
   Subjective:    I'm seeing this patient as a consultation for:  Dr Madilyn Fireman  CC: Right Ankle Pain  HPI: She notes a 5 month history of right medial ankle pain. Pain occurred without injury. Patient notes moderate to severe pain worse with ambulation and standing. Additionally she has pain at night. The pain can be quite bothersome. She had a workup for this in October including x-ray which showed some degenerative changes but was otherwise unremarkable. She was given an ASO brace which did not make much difference. She uses some over-the-counter medicines for pain control which helps some. She denies any fevers chills nausea vomiting or diarrhea.  Past medical history, Surgical history, Family history not pertinant except as noted below, Social history, Allergies, and medications have been entered into the medical record, reviewed, and no changes needed.   Review of Systems: No headache, visual changes, nausea, vomiting, diarrhea, constipation, dizziness, abdominal pain, skin rash, fevers, chills, night sweats, weight loss, swollen lymph nodes, body aches, joint swelling, muscle aches, chest pain, shortness of breath, mood changes, visual or auditory hallucinations.   Objective:    Filed Vitals:   01/23/15 0815  BP: 127/75  Pulse: 80   General: Well Developed, well nourished, and in no acute distress.  Neuro/Psych: Alert and oriented x3, extra-ocular muscles intact, able to move all 4 extremities, sensation grossly intact. Skin: Warm and dry, no rashes noted.  Respiratory: Not using accessory muscles, speaking in full sentences, trachea midline.  Cardiovascular: Pulses palpable, no extremity edema. Abdomen: Does not appear distended. MSK: Right ankle shows slight swelling at the tarsal tunnel. She is tender to palpation at the medial ankle along the course of the posterior tibialis tendon. With standing she has ankle pronation and mild subluxation. She has decreased heel inversion with  toe standing. She has intact strength to dorsal and plantar flexion inversion and eversion but some pain with resisted inversion. The foot shows breakdown of the longitudinal and transverse arch with splaying of the toes. She has some hammertoe deformities as well. Pulses capillary refill sensation intact. The foot is otherwise nontender. Antalgic gait.  Limited musculoskeletal ultrasound of the right medial ankle. The medial ankle is visualized at the medial malleolus. No significant bony changes. Posterior tibialis tendon visualized with significant hypoechoic fluid within the tendon sheath consistent with tenosynovitis. The nerve, digitorum and vascular structures were unremarkable.  Patient was fitted with medium  scaphoid pads and had improvement in pain.  X-ray right ankle dated 10/11/2014 reviewed.    No results found for this or any previous visit (from the past 24 hour(s)). No results found.  Impression and Recommendations:   This case required medical decision making of moderate complexity.

## 2015-01-23 NOTE — Assessment & Plan Note (Signed)
Symptoms and ultrasound consistent with tibialis tendinitis. Plan for orthotics, physical therapy, diclofenac solution. Recheck in a few weeks if not better. Would consider injection at that time.

## 2015-01-24 LAB — HIV ANTIBODY (ROUTINE TESTING W REFLEX): HIV 1&2 Ab, 4th Generation: NONREACTIVE

## 2015-01-24 LAB — COMPLETE METABOLIC PANEL WITH GFR
ALT: 20 U/L (ref 6–29)
AST: 16 U/L (ref 10–35)
Albumin: 3.8 g/dL (ref 3.6–5.1)
Alkaline Phosphatase: 55 U/L (ref 33–130)
BUN: 18 mg/dL (ref 7–25)
CO2: 29 mmol/L (ref 20–31)
Calcium: 8.7 mg/dL (ref 8.6–10.4)
Chloride: 101 mmol/L (ref 98–110)
Creat: 0.55 mg/dL (ref 0.50–1.05)
GFR, Est African American: >89 mL/min (ref >=60)
GFR, Est Non African American: >89 mL/min (ref >=60)
Glucose, Bld: 126 mg/dL — ABNORMAL HIGH (ref 65–99)
Potassium: 4.2 mmol/L (ref 3.5–5.3)
Sodium: 139 mmol/L (ref 135–146)
Total Bilirubin: 0.4 mg/dL (ref 0.2–1.2)
Total Protein: 6.6 g/dL (ref 6.1–8.1)

## 2015-01-24 LAB — LIPID PANEL
Cholesterol: 151 mg/dL (ref 125–200)
HDL: 62 mg/dL (ref >=46)
LDL Cholesterol: 78 mg/dL (ref <130)
Total CHOL/HDL Ratio: 2.4 Ratio (ref <=5.0)
Triglycerides: 56 mg/dL (ref <150)
VLDL: 11 mg/dL (ref <30)

## 2015-01-24 LAB — HEPATITIS C ANTIBODY: HCV Ab: NEGATIVE

## 2015-01-29 ENCOUNTER — Telehealth: Payer: Self-pay | Admitting: Family Medicine

## 2015-01-29 NOTE — Telephone Encounter (Signed)
Pt called clinic stating she was given a brace back in Sept that was too small so she returned it for a larger size. The Pt was billed for both braces so she contacted the company and was advised to have her PCP to write a letter stating the incorrect size brace was given the first time and returned and they will only charge her for one brace. Will route to PCP.

## 2015-01-30 ENCOUNTER — Ambulatory Visit (INDEPENDENT_AMBULATORY_CARE_PROVIDER_SITE_OTHER): Payer: BLUE CROSS/BLUE SHIELD | Admitting: Physical Therapy

## 2015-01-30 DIAGNOSIS — M25571 Pain in right ankle and joints of right foot: Secondary | ICD-10-CM | POA: Diagnosis not present

## 2015-01-30 DIAGNOSIS — M25471 Effusion, right ankle: Secondary | ICD-10-CM | POA: Diagnosis not present

## 2015-01-30 DIAGNOSIS — M25671 Stiffness of right ankle, not elsewhere classified: Secondary | ICD-10-CM

## 2015-01-30 DIAGNOSIS — R531 Weakness: Secondary | ICD-10-CM

## 2015-01-30 NOTE — Telephone Encounter (Signed)
Ok to write letter  Requesting she only be charged for one brace bc of incorrect fit and I will sign.

## 2015-01-30 NOTE — Patient Instructions (Signed)
Ankle Alphabet   Using left ankle and foot only, trace the letters of the alphabet. Perform A to Z. Repeat _1___ times per set. Do __1__ sets per session. Do __2-3__ sessions per day.   Ankle Circles   Slowly rotate right foot and ankle clockwise then counterclockwise. Gradually increase range of motion. Avoid pain. Circle __10__ times each direction per set. Do __1__ sets per session. Do 2-3____ sessions per day.  ROM: Plantar / Dorsiflexion   With left leg relaxed, gently flex and extend ankle. Move through full range of motion. Avoid pain. Repeat __10__ times per set. Do __1__ sets per session. Do __2-3__ sessions per day.   ROM: Inversion / Eversion   With left leg relaxed, gently turn ankle and foot in and out. Move through full range of motion. Avoid pain. Try and move only the foot, keep the leg still  Repeat _10___ times per set. Do _1___ sets per session. Do _2-3___ sessions per day.  Achilles / Gastroc, Standing   Stand, right foot behind, heel on floor and turned slightly out, leg straight, forward leg bent. Move hips forward. Hold _30__ seconds. Repeat _1__ times per session. Do _2-3__ sessions per day.   Stretching: Soleus   Stand with right foot back, both knees bent. Keeping heel on floor, turned slightly out, lean into wall until stretch is felt in lower calf. Hold _30___ seconds. Repeat __1_ times per set. Do _2-_3__ sessions per day.  Balance: Unilateral    Attempt to balance on left leg, eyes open. Hold _as long as able__ seconds. Repeat _5-10___ times per set. Do _1___ sets per session. Do _2-3___ sessions per day. Perform exercise with eyes closed.

## 2015-01-30 NOTE — Therapy (Signed)
[place], [place], [address] Phone: [phone]   Fax:  [phone]  Physical Therapy Evaluation  Patient Details  Name: Martha Woods MRN: [id number] Date of Birth: 01/06/1961 Referring Provider: Dr Steva Colder  Encounter Date: 01/30/2015      PT End of Session - 01/30/15 0851    Visit Number 1   Number of Visits 12   Date for PT Re-Evaluation 03/13/15   PT Start Time 0810   PT Stop Time 0851   PT Time Calculation (min) 41 min   Activity Tolerance Patient tolerated treatment well      Past Medical History  Diagnosis Date  . Cancer [facility]) 02-2009    papillary thyroid cancer, Dr. Malachy Mood  . Cervical spondylosis   . Lumbar spondylosis   . Diabetes mellitus     Gestattional Diabetes, insulin dependent  . Lumbar disc herniation   . Seborrheic dermatitis   . Abnormal Pap smear     cervical cryo    Past Surgical History  Procedure Laterality Date  . Total thyroidectomy  02-2009    thyroid cancer  . Plantar fascitis  01-2010    L (Dr. Wardell Honour)  . Ett      neg for ischemia -7 mets pelvic u/s normal    There were no vitals filed for this visit.  Visit Diagnosis:  Pain in joint, ankle and foot, right - Plan: PT plan of care cert/re-cert  Weakness generalized - Plan: PT plan of care cert/re-cert  Swelling of ankle, right - Plan: PT plan of care cert/re-cert  Stiffness of ankle joint, right - Plan: PT plan of care cert/re-cert      Subjective Assessment - 01/30/15 0805    Subjective Pt developed insidious onset of Rt ankle pain about 5 months ago, has been using anti-inflammatories and an ASO with limited relief. stopped the medication due to stomach issues,, uses the brace at work.    Pertinent History bilat heel spurs - surgery on the Lt 3-4 yrs ago   How long can you sit comfortably? no limits   How long can you stand comfortably? no limits if she is not hurting already.    How long can you walk comfortably? walking in a store   Diagnostic tests x-rays - some degenerative changes in the ankle   Patient Stated Goals stop pain and walk better,walk for exercise   Currently in Pain? Yes   Pain Score 5    Pain Location Ankle   Pain Orientation Right  posterior medial malleoli   Pain Descriptors / Indicators Tightness;Aching   Pain Onset More than a month ago   Pain Frequency Intermittent   Aggravating Factors  walking    Pain Relieving Factors gentle stretches - ankle pumps            [facility] PT Assessment - 01/30/15 0001    Assessment   Medical Diagnosis Rt posterior tibial tendonitis   Referring Provider Dr Steva Colder   Onset Date/Surgical Date 08/30/14   Hand Dominance Right   Next MD Visit 01/31/15  for orhtotics   Prior Therapy none   Precautions   Precautions None   Restrictions   Weight Bearing Restrictions --  none   Balance Screen   Has the patient fallen in the past 6 months No   Has the patient had a decrease in activity level because of a fear of falling?  No   Is the patient reluctant to  leave their home because of a fear of falling?  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   Leisure walk   Observation/Other Assessments   Focus on Therapeutic Outcomes (FOTO)  56% limited   Functional Tests   Functional tests Squat;Step down;Single leg stance   Squat   Comments poor eccentric control   Single Leg Stance   Comments Lt > 20 sec, Rt 5 sec with pain   Posture/Postural Control   Posture Comments pes planus bilat and LE valgus    ROM / Strength   AROM / PROM / Strength AROM;PROM;Strength   AROM   AROM Assessment Site Ankle   Right/Left Ankle Right;Left   Right Ankle Dorsiflexion 1   Right Ankle Plantar Flexion --   WNL   Right Ankle Inversion 18  pain   Right Ankle Eversion 12   Left Ankle Dorsiflexion 12  other ankle motion WNL   PROM   PROM Assessment Site Ankle   Right/Left Ankle Right  Lt WNL   Right Ankle Dorsiflexion 6   Right Ankle Inversion 25   Right Ankle Eversion 24   Strength   Strength Assessment Site Hip;Knee;Ankle   Right/Left Hip --  bilat grossly 4+/5   Right/Left Knee --  bilat WNL   Right/Left Ankle Right  Lt WNL   Right Ankle Dorsiflexion 4/5   Right Ankle Plantar Flexion 4-/5   Right Ankle Inversion 4-/5   Right Ankle Eversion 4-/5   Palpation   Palpation comment tight and tender in Rt lower leg                   [facility] Adult PT Treatment/Exercise - 01/30/15 0001    Exercises   Exercises Ankle   Ankle Exercises: Stretches   Soleus Stretch 30 seconds   Gastroc Stretch 30 seconds   Ankle Exercises: Standing   SLS 5 attempts with max hold   Ankle Exercises: Seated   ABC's 1 rep   Ankle Circles/Pumps 10 reps   Other Seated Ankle Exercises inversion/eversion                PT Education - 01/30/15 0838    Education provided Yes   Education Details HEP   Person(s) Educated Patient   Methods Explanation;Demonstration;Handout   Comprehension Verbalized understanding;Returned demonstration             PT Long Term Goals - 01/30/15 1302    PT LONG TERM GOAL #1   Title I with advanced HEP ( 03/13/15)    Time 6   Period Weeks   Status New   PT LONG TERM GOAL #2   Title increase Rt ankle ROM to WNL without pain ( 03/13/15)    Time 6   Period Weeks   Status New   PT LONG TERM GOAL #3   Title increase Rt LE strength =/> 5-/5 ( 03/13/15)    Time 6   Period Weeks   Status New   PT LONG TERM GOAL #4   Title report =/> 75% reduction in Rt anlke pain to allow return to walking program ( 03/13/15)    Time 6   Period Weeks   Status New   PT LONG TERM GOAL #5   Title  improve FOTO =/< 41% limited ( 03/13/15)    Time 6   Period Weeks    Status New               Plan - 01/30/15 [id number]    Clinical Impression Statement 55 yo female with 5 month h/o progressive Rt ankle pain. She now has LE weakness and postural changes in her feet due to tight muscles. She has limited ROM as well that make walking difficult at times. She will be out of town for one week Pt would benefit from iontophoresis and TDN    Pt will benefit from skilled therapeutic intervention in order to improve on the following deficits Decreased strength;Increased edema;Decreased balance;Impaired flexibility;Pain;Increased muscle spasms;Decreased range of motion;Difficulty walking   Rehab Potential Good   PT Frequency 2x / week   PT Duration 6 weeks   PT Treatment/Interventions Ultrasound;Neuromuscular re-education;Gait training;Cryotherapy;Patient/family education;Passive range of motion;Dry needling;Electrical Stimulation;Iontophoresis 4mg /ml Dexamethasone;Therapeutic exercise;Manual techniques;Vasopneumatic Device         Problem List Patient Active Problem List   Diagnosis Date Noted  . Posterior tibial tendinitis of right lower extremity 01/23/2015  . Thalassemia 11/10/2013  . Migraine headache 08/10/2013  . ASC-cannot exclude HGSIL on Pap 12/24/2011  . Hypothyroid 02/20/2011  . CARCINOMA, THYROID GLAND, PAPILLARY 12/15/2008  . UNSPECIFIED ANEMIA 09/21/2008  . SHOULDER PAIN, RIGHT 12/15/2007  . CARPAL TUNNEL SYNDROME, BILATERAL 08/10/2007  . ARTHRITIS, CERVICAL SPINE 03/30/2006  . Diabetes ([place]) 10/21/2005  . Hyperlipidemia 10/21/2005  . OBESITY, NOS 10/21/2005  . HYPERTENSION, BENIGN SYSTEMIC 10/21/2005  . MENORRHAGIA 10/21/2005    Manuela Schwartz Jamaica Inthavong PT 01/30/2015, 1:19 PM  [facility] [place], [place], [address] Phone: [phone]   Fax:  [phone]  Name: Martha Woods MRN: [id number] Date of Birth: 11/09/1960

## 2015-01-31 ENCOUNTER — Ambulatory Visit (INDEPENDENT_AMBULATORY_CARE_PROVIDER_SITE_OTHER): Payer: BLUE CROSS/BLUE SHIELD | Admitting: Family Medicine

## 2015-01-31 ENCOUNTER — Encounter: Payer: Self-pay | Admitting: Family Medicine

## 2015-01-31 VITALS — BP 115/68 | HR 93 | Wt 224.0 lb

## 2015-01-31 DIAGNOSIS — M2170 Unequal limb length (acquired), unspecified site: Secondary | ICD-10-CM

## 2015-01-31 NOTE — Telephone Encounter (Signed)
Letter has been written and Pt notified via VM that letter was ready for pick up.

## 2015-01-31 NOTE — Patient Instructions (Signed)
Thank you for coming in today. Return as directed.  Let me know if the orthotics are not comfortable.  Use the Hapad Medium Scaphoid Pads in the other shoes.

## 2015-01-31 NOTE — Progress Notes (Signed)
          Orthotics Note:   Patient was fitted for a : standard, cushioned, semi-rigid orthotic. The orthotic was heated and afterward the patient stood on the orthotic blank positioned on the orthotic stand. The patient was positioned in subtalar neutral position and 10 degrees of ankle dorsiflexion in a weight bearing stance. After completion of molding, a stable base was applied to the orthotic blank. The blank was ground to a stable position for weight bearing. Size: 9 Base: [employer] safety [employer] and Padding: Right 1/2 cm taller than left.  The patient ambulated these, and they were very comfortable.  I spent 40 minutes with this patient, greater than 50% was face-to-face time counseling regarding the below diagnosis.  Pt has a 1 cm leg length difference R<L.

## 2015-02-02 ENCOUNTER — Ambulatory Visit (INDEPENDENT_AMBULATORY_CARE_PROVIDER_SITE_OTHER): Payer: BLUE CROSS/BLUE SHIELD | Admitting: Physical Therapy

## 2015-02-02 DIAGNOSIS — M25471 Effusion, right ankle: Secondary | ICD-10-CM | POA: Diagnosis not present

## 2015-02-02 DIAGNOSIS — M25671 Stiffness of right ankle, not elsewhere classified: Secondary | ICD-10-CM | POA: Diagnosis not present

## 2015-02-02 DIAGNOSIS — R531 Weakness: Secondary | ICD-10-CM | POA: Diagnosis not present

## 2015-02-02 DIAGNOSIS — M25571 Pain in right ankle and joints of right foot: Secondary | ICD-10-CM | POA: Diagnosis not present

## 2015-02-02 NOTE — Therapy (Signed)
[place], [place], [address] Phone: [phone]   Fax:  [phone]  Physical Therapy Treatment  Patient Details  Name: Martha Woods MRN: [id number] Date of Birth: 1960/05/01 Referring Provider: Dr. Georgina Snell   Encounter Date: 02/02/2015      PT End of Session - 02/02/15 0902    Visit Number 2   Number of Visits 12   Date for PT Re-Evaluation 03/13/15   PT Start Time 0802   PT Stop Time 0850   PT Time Calculation (min) 48 min   Activity Tolerance Patient tolerated treatment well  slight increase in pain during exercise      Past Medical History  Diagnosis Date  . Cancer [facility]) 02-2009    papillary thyroid cancer, Dr. Malachy Mood  . Cervical spondylosis   . Lumbar spondylosis   . Diabetes mellitus     Gestattional Diabetes, insulin dependent  . Lumbar disc herniation   . Seborrheic dermatitis   . Abnormal Pap smear     cervical cryo    Past Surgical History  Procedure Laterality Date  . Total thyroidectomy  02-2009    thyroid cancer  . Plantar fascitis  01-2010    L (Dr. Wardell Honour)  . Ett      neg for ischemia -7 mets pelvic u/s normal    There were no vitals filed for this visit.  Visit Diagnosis:  Pain in joint, ankle and foot, right  Weakness generalized  Swelling of ankle, right  Stiffness of ankle joint, right      Subjective Assessment - 02/02/15 0805    Subjective Pt reports she had orthotics made 2 days ago and has noticed a decrease in pain since then.  She has been icing ankle 1x/day and performing HEP.    Currently in Pain? Yes   Pain Score 5    Pain Location Ankle   Pain Orientation Right;Posterior;Medial   Pain Descriptors / Indicators Sharp   Aggravating Factors  walking    Pain Relieving Factors rest             [facility] PT Assessment - 02/02/15 0001    Assessment   Medical Diagnosis Rt posterior tibial tendonitis   Referring Provider Dr.  Georgina Snell    Onset Date/Surgical Date 08/30/14   Hand Dominance Right          [facility] Adult PT Treatment/Exercise - 02/02/15 0001    Modalities   Modalities Iontophoresis;Ultrasound   Ultrasound   Ultrasound Location Rt medial ankle (post tib)    Ultrasound Parameters 50%, 1.1 w/cm2, 8 min    Ultrasound Goals Pain   Iontophoresis   Type of Iontophoresis Dexamethasone   Location Rt medial ankle    Dose 1.3 cc   Time 12 hr patch    Ankle Exercises: Stretches   Soleus Stretch 30 seconds;3 reps  each side    Gastroc Stretch 30 seconds;3 reps   Ankle Exercises: Aerobic   Stationary Bike NuStep L3: 5.5 min    Ankle Exercises: Standing   SLS 2 trials 30 sec; repeated with horizontal / vertical head turns (challenging)   Heel Raises 10 reps   Toe Raise 10 reps   Ankle Exercises: Seated   ABC's 1 rep   Ankle Circles/Pumps 10 reps   Other Seated Ankle Exercises red band exercise Rt ankle DF, eversion x 10 reps x 2 sets,  inversion x 10 reps  PT Long Term Goals - 02/02/15 0915    PT LONG TERM GOAL #1   Title I with advanced HEP ( 03/13/15)    Time 6   Period Weeks   Status On-going   PT LONG TERM GOAL #2   Title increase Rt ankle ROM to WNL without pain ( 03/13/15)    Time 6   Period Weeks   Status On-going   PT LONG TERM GOAL #3   Title increase Rt LE strength =/> 5-/5 ( 03/13/15)    Time 6   Period Weeks   Status On-going   PT LONG TERM GOAL #4   Title report =/> 75% reduction in Rt ankle pain to allow return to walking program ( 03/13/15)    Time 6   Period Weeks   Status On-going   PT LONG TERM GOAL #5   Title improve FOTO =/< 41% limited ( 03/13/15)    Time 6   Period Weeks   Status On-going               Plan - 02/02/15 0902    Clinical Impression Statement Pt tolerated exercises with minimal increase in pain.  Pt reported soleus stretch was painful; pt encouraged to back off and perform gentle stretches.  Initiated first session of  iontophoresis.  Progressing towards goals.    Pt will benefit from skilled therapeutic intervention in order to improve on the following deficits Decreased strength;Increased edema;Decreased balance;Impaired flexibility;Pain;Increased muscle spasms;Decreased range of motion;Difficulty walking   Rehab Potential Good   PT Frequency 2x / week   PT Duration 6 weeks   PT Treatment/Interventions Ultrasound;Neuromuscular re-education;Gait training;Cryotherapy;Patient/family education;Passive range of motion;Dry needling;Electrical Stimulation;Iontophoresis 4mg /ml Dexamethasone;Therapeutic exercise;Manual techniques;Vasopneumatic Device   PT Next Visit Plan Assess response to ionto.  Add band ankle exercises to HEP if tolerated.    Consulted and Agree with Plan of Care Patient        Problem List Patient Active Problem List   Diagnosis Date Noted  . Leg length inequality 01/31/2015  . Posterior tibial tendinitis of right lower extremity 01/23/2015  . Thalassemia 11/10/2013  . Migraine headache 08/10/2013  . ASC-cannot exclude HGSIL on Pap 12/24/2011  . Hypothyroid 02/20/2011  . CARCINOMA, THYROID GLAND, PAPILLARY 12/15/2008  . UNSPECIFIED ANEMIA 09/21/2008  . SHOULDER PAIN, RIGHT 12/15/2007  . CARPAL TUNNEL SYNDROME, BILATERAL 08/10/2007  . ARTHRITIS, CERVICAL SPINE 03/30/2006  . Diabetes ([place]) 10/21/2005  . Hyperlipidemia 10/21/2005  . OBESITY, NOS 10/21/2005  . HYPERTENSION, BENIGN SYSTEMIC 10/21/2005  . MENORRHAGIA 10/21/2005    Kerin Perna, PTA 02/02/2015 9:16 AM  [facility] [place], [place], [address] Phone: [phone]   Fax:  [phone]  Name: Martha Woods MRN: [id number] Date of Birth: 1960-02-24

## 2015-02-06 ENCOUNTER — Encounter: Payer: BLUE CROSS/BLUE SHIELD | Admitting: Physical Therapy

## 2015-02-07 ENCOUNTER — Ambulatory Visit (INDEPENDENT_AMBULATORY_CARE_PROVIDER_SITE_OTHER): Payer: BLUE CROSS/BLUE SHIELD | Admitting: Physical Therapy

## 2015-02-07 DIAGNOSIS — M25471 Effusion, right ankle: Secondary | ICD-10-CM

## 2015-02-07 DIAGNOSIS — M25671 Stiffness of right ankle, not elsewhere classified: Secondary | ICD-10-CM

## 2015-02-07 DIAGNOSIS — M25571 Pain in right ankle and joints of right foot: Secondary | ICD-10-CM | POA: Diagnosis not present

## 2015-02-07 DIAGNOSIS — R531 Weakness: Secondary | ICD-10-CM

## 2015-02-07 NOTE — Therapy (Signed)
[place], [place], [address] Phone: [phone]   Fax:  [phone]  Physical Therapy Treatment  Patient Details  Name: Martha Woods MRN: [id number] Date of Birth: 1960/07/22 Referring Provider: Dr. Georgina Snell  Encounter Date: 02/07/2015      PT End of Session - 02/07/15 0803    Visit Number 3   Number of Visits 12   Date for PT Re-Evaluation 03/13/15   PT Start Time 0801   PT Stop Time 0845   PT Time Calculation (min) 44 min   Activity Tolerance Patient tolerated treatment well      Past Medical History  Diagnosis Date  . Cancer [facility]) 02-2009    papillary thyroid cancer, Dr. Malachy Mood  . Cervical spondylosis   . Lumbar spondylosis   . Diabetes mellitus     Gestattional Diabetes, insulin dependent  . Lumbar disc herniation   . Seborrheic dermatitis   . Abnormal Pap smear     cervical cryo    Past Surgical History  Procedure Laterality Date  . Total thyroidectomy  02-2009    thyroid cancer  . Plantar fascitis  01-2010    L (Dr. Wardell Honour)  . Ett      neg for ischemia -7 mets pelvic u/s normal    There were no vitals filed for this visit.  Visit Diagnosis:  Pain in joint, ankle and foot, right  Weakness generalized  Swelling of ankle, right  Stiffness of ankle joint, right      Subjective Assessment - 02/07/15 0804    Subjective Pt reports she had some pain with walking around shopping over weekend.  Pain decreases with rest.  Continues to ice 1x/day and perform HEP.  Pt reports she has pain with prolonged standing or direct pressure to area on  Rt ankle, otherwise with rest there is very little/no pain.    Currently in Pain? No/denies            [facility] PT Assessment - 02/07/15 0001    Assessment   Medical Diagnosis Rt posterior tibial tendonitis   Referring Provider Dr. Georgina Snell   Onset Date/Surgical Date 08/30/14   Hand Dominance Right   Next MD Visit PRN    AROM   AROM Assessment Site Ankle   Right/Left Ankle Right   Right Ankle Dorsiflexion 10   Right Ankle Plantar Flexion --  WNL   Right Ankle Inversion 28   Right Ankle Eversion 22           [facility] Adult PT Treatment/Exercise - 02/07/15 0001    Exercises   Exercises Ankle   Modalities   Modalities Iontophoresis;Ultrasound   Ultrasound   Ultrasound Location Rt post tib/ medial ankle   Ultrasound Parameters 50%, 1.1 w/cm2, 8 min    Ultrasound Goals Pain  tightness   Iontophoresis   Type of Iontophoresis Dexamethasone   Location Rt medial ankle    Dose 1.0 cc    Time 6 hr patch   Manual Therapy   Manual Therapy Soft tissue mobilization   Soft tissue mobilization to Rt posterior tib into foot at attachment with passive DF   Ankle Exercises: Stretches   Soleus Stretch 30 seconds;3 reps  each side    Gastroc Stretch 30 seconds;3 reps   Ankle Exercises: Aerobic   Stationary Bike NuStep L4: 5 min    Ankle Exercises: Standing   SLS 3 trials each leg standing on blue pad (challenging on Rt leg required freq  UE support)   Heel Raises 10 reps   Ankle Exercises: Seated   Ankle Circles/Pumps 15 reps   Other Seated Ankle Exercises red band exercise Rt ankle DF, eversion, inversion  x 10 reps x 2 sets           PT Long Term Goals - 02/02/15 0915    PT LONG TERM GOAL #1   Title I with advanced HEP ( 03/13/15)    Time 6   Period Weeks   Status On-going   PT LONG TERM GOAL #2   Title increase Rt ankle ROM to WNL without pain ( 03/13/15)    Time 6   Period Weeks   Status On-going   PT LONG TERM GOAL #3   Title increase Rt LE strength =/> 5-/5 ( 03/13/15)    Time 6   Period Weeks   Status On-going   PT LONG TERM GOAL #4   Title report =/> 75% reduction in Rt ankle pain to allow return to walking program ( 03/13/15)    Time 6   Period Weeks   Status On-going   PT LONG TERM GOAL #5   Title improve FOTO =/< 41% limited ( 03/13/15)    Time 6   Period Weeks   Status On-going        Education: pt issued handout with band exercises for ankle and a red band.  Pt returned demo of these exercises.           Plan - 02/07/15 0936    Clinical Impression Statement Pt tolerated exercises with minimal/mild increase in pain; reduced with rest and ankle circles. Pt demonstrated improved Rt ankle ROM; very close to meeting LTG#2.  Progressing well towards other goals.    Pt will benefit from skilled therapeutic intervention in order to improve on the following deficits Decreased strength;Increased edema;Decreased balance;Impaired flexibility;Pain;Increased muscle spasms;Decreased range of motion;Difficulty walking   Rehab Potential Good   PT Frequency 2x / week   PT Duration 6 weeks   PT Treatment/Interventions Ultrasound;Neuromuscular re-education;Gait training;Cryotherapy;Patient/family education;Passive range of motion;Dry needling;Electrical Stimulation;Iontophoresis 4mg /ml Dexamethasone;Therapeutic exercise;Manual techniques;Vasopneumatic Device   PT Next Visit Plan Continue ionto treatment and progressive ankle ROM/ strengthening.    Consulted and Agree with Plan of Care Patient        Problem List Patient Active Problem List   Diagnosis Date Noted  . Leg length inequality 01/31/2015  . Posterior tibial tendinitis of right lower extremity 01/23/2015  . Thalassemia 11/10/2013  . Migraine headache 08/10/2013  . ASC-cannot exclude HGSIL on Pap 12/24/2011  . Hypothyroid 02/20/2011  . CARCINOMA, THYROID GLAND, PAPILLARY 12/15/2008  . UNSPECIFIED ANEMIA 09/21/2008  . SHOULDER PAIN, RIGHT 12/15/2007  . CARPAL TUNNEL SYNDROME, BILATERAL 08/10/2007  . ARTHRITIS, CERVICAL SPINE 03/30/2006  . Diabetes ([place]) 10/21/2005  . Hyperlipidemia 10/21/2005  . OBESITY, NOS 10/21/2005  . HYPERTENSION, BENIGN SYSTEMIC 10/21/2005  . MENORRHAGIA 10/21/2005    Kerin Perna, PTA 02/07/2015 12:56 PM  [place], [place], [address] Phone: [phone]   Fax:  [phone]  Name: Martha Woods MRN: [id number] Date of Birth: [birth year]

## 2015-02-07 NOTE — Patient Instructions (Signed)
Inversion: Resisted   Cross legs with right leg underneath, foot in tubing loop. Hold tubing around other foot to resist and turn foot in. Repeat __10__ times per set. Do _2___ sets per session. Do _1___ sessions per day.  http://orth.exer.us/12   Copyright  VHI. All rights reserved.  Eversion: Resisted   With right foot in tubing loop, hold tubing around other foot to resist and turn foot out. Repeat __10__ times per set. Do __2__ sets per session. Do _1___ sessions per day.  http://orth.exer.us/14    Dorsiflexion: Resisted   Facing anchor, tubing around left foot, pull toward face.  Repeat _10___ times per set. Do _2___ sets per session. Do __1_ sessions per day.  http://orth.exer.us/8   [facility] [place], [place] [address]  [phone] (office) [phone] (fax)

## 2015-02-09 ENCOUNTER — Ambulatory Visit (INDEPENDENT_AMBULATORY_CARE_PROVIDER_SITE_OTHER): Payer: BLUE CROSS/BLUE SHIELD | Admitting: Physical Therapy

## 2015-02-09 DIAGNOSIS — M25571 Pain in right ankle and joints of right foot: Secondary | ICD-10-CM

## 2015-02-09 DIAGNOSIS — M25671 Stiffness of right ankle, not elsewhere classified: Secondary | ICD-10-CM | POA: Diagnosis not present

## 2015-02-09 DIAGNOSIS — R531 Weakness: Secondary | ICD-10-CM

## 2015-02-09 DIAGNOSIS — M25471 Effusion, right ankle: Secondary | ICD-10-CM

## 2015-02-09 NOTE — Therapy (Signed)
[place], [place], [address] Phone: [phone]   Fax:  [phone]  Physical Therapy Treatment  Patient Details  Name: Martha Woods MRN: [id number] Date of Birth: Dec 28, 1960 Referring Provider: Dr. Georgina Snell  Encounter Date: 02/09/2015      PT End of Session - 02/09/15 0803    Visit Number 4   Number of Visits 12   Date for PT Re-Evaluation 03/13/15   PT Start Time 0803   PT Stop Time 0843   PT Time Calculation (min) 40 min   Activity Tolerance Patient tolerated treatment well      Past Medical History  Diagnosis Date  . Cancer [facility]) 02-2009    papillary thyroid cancer, Dr. Malachy Mood  . Cervical spondylosis   . Lumbar spondylosis   . Diabetes mellitus     Gestattional Diabetes, insulin dependent  . Lumbar disc herniation   . Seborrheic dermatitis   . Abnormal Pap smear     cervical cryo    Past Surgical History  Procedure Laterality Date  . Total thyroidectomy  02-2009    thyroid cancer  . Plantar fascitis  01-2010    L (Dr. Wardell Honour)  . Ett      neg for ischemia -7 mets pelvic u/s normal    There were no vitals filed for this visit.  Visit Diagnosis:  Pain in joint, ankle and foot, right  Swelling of ankle, right  Weakness generalized  Stiffness of ankle joint, right      Subjective Assessment - 02/09/15 0804    Subjective Pt reports she has slight pain in ankle with stretches and at end of day.  No irritation from ionto patch. Will be out of town Feb 10-20.     Currently in Pain? No/denies            [facility] PT Assessment - 02/09/15 0001    Assessment   Medical Diagnosis Rt posterior tibial tendonitis   Referring Provider Dr. Georgina Snell   Onset Date/Surgical Date 08/30/14   Hand Dominance Right   Next MD Visit PRN          [facility] Adult PT Treatment/Exercise - 02/09/15 0001    Modalities   Modalities Iontophoresis;Ultrasound   Ultrasound   Ultrasound Location Rt post tib    Ultrasound Parameters 50%, 1.2 w/cm2, 8 min    Ultrasound Goals Pain  tightness   Iontophoresis   Type of Iontophoresis Dexamethasone   Location Rt medial ankle    Dose 1.0 cc    Time 6 hr patch   Ankle Exercises: Stretches   Soleus Stretch 30 seconds;3 reps  each side    Gastroc Stretch 30 seconds;3 reps   Other Stretch Hamstring stretch x 20 sec x 2 reps each leg.    Ankle Exercises: Aerobic   Stationary Bike L1: 5 min    Ankle Exercises: Standing   Heel Raises 15 reps   Toe Raise 15 reps   Other Standing Ankle Exercises Inv/eversion on half foam roll x 10 reps (with UE support) followed by balance on 1/2 foam each side.    Other Standing Ankle Exercises Step ups on Bosu x 5 reps (challenging)            PT Long Term Goals - 02/02/15 0915    PT LONG TERM GOAL #1   Title I with advanced HEP ( 03/13/15)    Time 6   Period Weeks   Status On-going   PT  LONG TERM GOAL #2   Title increase Rt ankle ROM to WNL without pain ( 03/13/15)    Time 6   Period Weeks   Status On-going   PT LONG TERM GOAL #3   Title increase Rt LE strength =/> 5-/5 ( 03/13/15)    Time 6   Period Weeks   Status On-going   PT LONG TERM GOAL #4   Title report =/> 75% reduction in Rt ankle pain to allow return to walking program ( 03/13/15)    Time 6   Period Weeks   Status On-going   PT LONG TERM GOAL #5   Title improve FOTO =/< 41% limited ( 03/13/15)    Time 6   Period Weeks   Status On-going               Plan - 02/09/15 0846    Clinical Impression Statement Pt tolerated new exercises with slight increase in pain with any ankle inversion; reduced with rest, stretches.  Progressing well towards established goals.    Pt will benefit from skilled therapeutic intervention in order to improve on the following deficits Decreased strength;Increased edema;Decreased balance;Impaired flexibility;Pain;Increased muscle spasms;Decreased range of motion;Difficulty  walking   Rehab Potential Good   PT Frequency 2x / week   PT Duration 6 weeks   PT Treatment/Interventions Ultrasound;Neuromuscular re-education;Gait training;Cryotherapy;Patient/family education;Passive range of motion;Dry needling;Electrical Stimulation;Iontophoresis 4mg /ml Dexamethasone;Therapeutic exercise;Manual techniques;Vasopneumatic Device   PT Next Visit Plan Continue ionto treatment and progressive ankle ROM/ strengthening. Progress HEP, to include hip strengthening.    Consulted and Agree with Plan of Care Patient        Problem List Patient Active Problem List   Diagnosis Date Noted  . Leg length inequality 01/31/2015  . Posterior tibial tendinitis of right lower extremity 01/23/2015  . Thalassemia 11/10/2013  . Migraine headache 08/10/2013  . ASC-cannot exclude HGSIL on Pap 12/24/2011  . Hypothyroid 02/20/2011  . CARCINOMA, THYROID GLAND, PAPILLARY 12/15/2008  . UNSPECIFIED ANEMIA 09/21/2008  . SHOULDER PAIN, RIGHT 12/15/2007  . CARPAL TUNNEL SYNDROME, BILATERAL 08/10/2007  . ARTHRITIS, CERVICAL SPINE 03/30/2006  . Diabetes ([place]) 10/21/2005  . Hyperlipidemia 10/21/2005  . OBESITY, NOS 10/21/2005  . HYPERTENSION, BENIGN SYSTEMIC 10/21/2005  . MENORRHAGIA 10/21/2005    Martha Woods, PTA 02/09/2015 9:14 AM  [facility] [place], [place], [address] Phone: [phone]   Fax:  [phone]  Name: Martha Woods MRN: [id number] Date of Birth: December 05, 1960

## 2015-02-14 ENCOUNTER — Ambulatory Visit (INDEPENDENT_AMBULATORY_CARE_PROVIDER_SITE_OTHER): Payer: BLUE CROSS/BLUE SHIELD | Admitting: Physical Therapy

## 2015-02-14 ENCOUNTER — Encounter: Payer: BLUE CROSS/BLUE SHIELD | Admitting: Physical Therapy

## 2015-02-14 DIAGNOSIS — M25571 Pain in right ankle and joints of right foot: Secondary | ICD-10-CM

## 2015-02-14 DIAGNOSIS — M25471 Effusion, right ankle: Secondary | ICD-10-CM | POA: Diagnosis not present

## 2015-02-14 DIAGNOSIS — M25671 Stiffness of right ankle, not elsewhere classified: Secondary | ICD-10-CM | POA: Diagnosis not present

## 2015-02-14 DIAGNOSIS — R531 Weakness: Secondary | ICD-10-CM

## 2015-02-14 NOTE — Patient Instructions (Signed)
Lateral Step-Up   Right leg Stand with _6__ inch step placed in L direction. Step onto step with right foot, facing forward, and without pushing off with the ground foot. Finish with ground foot in touch balance on step and return _10__ times. _1-2__ sets __1_ times per day.  http://gglj.exer.us/167   Step-Up: Forward    Leading with right leg, bring both feet onto ___6_ inch step. Return to starting position, leading with left leg. Repeat __10__ times per session, 1-2 sets.   Straight Leg Raise    Tighten stomach and slowly raise locked right leg to height of opposite knee  Repeat __10__ times per set. Do _1-2___ sets per session.  Abduction: Clam (Eccentric) - Side-Lying    Lie on side with knees bent. Lift top knee, keeping feet together. Keep trunk steady. Slowly lower for 3-5 seconds. _10__ reps per set, __2_ sets per session,     [place] [facility] [place], [place] [address]  [phone] (office) [phone] (fax)

## 2015-02-14 NOTE — Therapy (Signed)
[place], [place], [address] Phone: [phone]   Fax:  [phone]  Physical Therapy Treatment  Patient Details  Name: Martha Woods MRN: [id number] Date of Birth: 08/28/1960 Referring Provider: Dr. Georgina Snell  Encounter Date: 02/14/2015      PT End of Session - 02/14/15 0855    Visit Number 5   Number of Visits 12   Date for PT Re-Evaluation 03/13/15   PT Start Time 0850   PT Stop Time 0943   PT Time Calculation (min) 53 min      Past Medical History  Diagnosis Date  . Cancer [facility]) 02-2009    papillary thyroid cancer, Dr. Malachy Mood  . Cervical spondylosis   . Lumbar spondylosis   . Diabetes mellitus     Gestattional Diabetes, insulin dependent  . Lumbar disc herniation   . Seborrheic dermatitis   . Abnormal Pap smear     cervical cryo    Past Surgical History  Procedure Laterality Date  . Total thyroidectomy  02-2009    thyroid cancer  . Plantar fascitis  01-2010    L (Dr. Wardell Honour)  . Ett      neg for ischemia -7 mets pelvic u/s normal    There were no vitals filed for this visit.  Visit Diagnosis:  Pain in joint, ankle and foot, right  Swelling of ankle, right  Weakness generalized  Stiffness of ankle joint, right      Subjective Assessment - 02/14/15 0851    Subjective Pt reports she had relief from last ionto patch last week.  "I felt so good, I overdid it at mall" (2 days after last session).  Pt reports she had increased pain, held off on HEP and tried to rest/ ice.  Pain has reduced since then    Currently in Pain? Yes   Pain Score 5    Pain Location Ankle   Pain Orientation Right;Posterior;Medial   Pain Descriptors / Indicators Aching;Dull   Aggravating Factors  prolonged standing, walking   Pain Relieving Factors rest, ice             [facility] PT Assessment - 02/14/15 0001    Assessment   Medical Diagnosis Rt posterior tibial tendonitis   Referring Provider Dr. Georgina Snell   Onset Date/Surgical Date 08/30/14   Hand Dominance Right   Next MD Visit PRN   AROM   AROM Assessment Site Ankle   Right/Left Ankle Right   Right Ankle Dorsiflexion 8   Right Ankle Plantar Flexion --  WNL   Right Ankle Inversion 30   Right Ankle Eversion 28   Strength   Strength Assessment Site Hip   Right/Left Hip Right;Left   Right Hip Flexion 4+/5   Right Hip Extension --  5-/5   Right Hip ABduction 3+/5   Left Hip Flexion 5/5   Left Hip Extension 4+/5   Left Hip ABduction --  5-/5   Right/Left Ankle Right   Right Ankle Dorsiflexion 5/5   Right Ankle Inversion --  5-/5          [facility] Adult PT Treatment/Exercise - 02/14/15 0001    Exercises   Exercises Knee/Hip;Ankle   Knee/Hip Exercises: Standing   Heel Raises Both;1 set;10 reps   Lateral Step Up Right;1 set;10 reps;Hand Hold: 2;Step Height: 6"   Forward Step Up Right;1 set;10 reps;Step Height: 6";Hand Hold: 2   Knee/Hip Exercises: Supine   Straight Leg Raises Right;1 set;10 reps  Knee/Hip Exercises: Sidelying   Hip ABduction Right;1 set;10 reps   Clams Right, 10 reps    Modalities   Modalities Iontophoresis;Ultrasound   Ultrasound   Ultrasound Location Rt post tib   Ultrasound Parameters 50%, 3.3 mHz, 1.1 w/cm2, 8 min    Ultrasound Goals Pain  tightness   Iontophoresis   Type of Iontophoresis Dexamethasone   Location Rt medial ankle    Dose 1.0 cc    Time 6 hr patch   Ankle Exercises: Stretches   Soleus Stretch 30 seconds;3 reps  each side    Gastroc Stretch 30 seconds;3 reps   Other Stretch Hamstring stretch x 20 sec x 2 reps each leg.    Ankle Exercises: Aerobic   Stationary Bike NuStep L4: 5 min    Ankle Exercises: Standing   Rocker Board --  inversion/eversion x 10 reps x 2 sets   Heel Raises 10 reps   Ankle Exercises: Seated   Ankle Circles/Pumps 15 reps                     PT Long Term Goals - 02/02/15 0915    PT LONG TERM GOAL #1   Title I  with advanced HEP ( 03/13/15)    Time 6   Period Weeks   Status On-going   PT LONG TERM GOAL #2   Title increase Rt ankle ROM to WNL without pain ( 03/13/15)    Time 6   Period Weeks   Status On-going   PT LONG TERM GOAL #3   Title increase Rt LE strength =/> 5-/5 ( 03/13/15)    Time 6   Period Weeks   Status On-going   PT LONG TERM GOAL #4   Title report =/> 75% reduction in Rt ankle pain to allow return to walking program ( 03/13/15)    Time 6   Period Weeks   Status On-going   PT LONG TERM GOAL #5   Title improve FOTO =/< 41% limited ( 03/13/15)    Time 6   Period Weeks   Status On-going               Plan - 02/14/15 1610    Clinical Impression Statement Pt demonstrated slight decrease in Rt ankle DF since last assessment. Pt demonstrated weakness in Rt hip abd and flexion; was given exercises to adress this.  No goals met since recent flare up.    Pt will benefit from skilled therapeutic intervention in order to improve on the following deficits Decreased strength;Increased edema;Decreased balance;Impaired flexibility;Pain;Increased muscle spasms;Decreased range of motion;Difficulty walking   Rehab Potential Good   PT Frequency 2x / week   PT Duration 6 weeks   PT Treatment/Interventions Ultrasound;Neuromuscular re-education;Gait training;Cryotherapy;Patient/family education;Passive range of motion;Dry needling;Electrical Stimulation;Iontophoresis 65m/ml Dexamethasone;Therapeutic exercise;Manual techniques;Vasopneumatic Device   PT Next Visit Plan Continue ionto treatment and progressive ankle ROM/ strengthening. Progress HEP.    Consulted and Agree with Plan of Care Patient        Problem List Patient Active Problem List   Diagnosis Date Noted  . Leg length inequality 01/31/2015  . Posterior tibial tendinitis of right lower extremity 01/23/2015  . Thalassemia 11/10/2013  . Migraine headache 08/10/2013  . ASC-cannot exclude HGSIL on Pap 12/24/2011  . Hypothyroid  02/20/2011  . CARCINOMA, THYROID GLAND, PAPILLARY 12/15/2008  . UNSPECIFIED ANEMIA 09/21/2008  . SHOULDER PAIN, RIGHT 12/15/2007  . CARPAL TUNNEL SYNDROME, BILATERAL 08/10/2007  . ARTHRITIS, CERVICAL SPINE 03/30/2006  . Diabetes ([place]  10/21/2005  . Hyperlipidemia 10/21/2005  . OBESITY, NOS 10/21/2005  . HYPERTENSION, BENIGN SYSTEMIC 10/21/2005  . MENORRHAGIA 10/21/2005   Kerin Perna, PTA 02/14/2015 10:08 AM  [place], [place], [address] Phone: [phone]   Fax:  [phone]  Name: Morayo Leven MRN: [id number] Date of Birth: [birth year]

## 2015-02-16 ENCOUNTER — Ambulatory Visit (INDEPENDENT_AMBULATORY_CARE_PROVIDER_SITE_OTHER): Payer: BLUE CROSS/BLUE SHIELD | Admitting: Physical Therapy

## 2015-02-16 DIAGNOSIS — M25671 Stiffness of right ankle, not elsewhere classified: Secondary | ICD-10-CM

## 2015-02-16 DIAGNOSIS — M25471 Effusion, right ankle: Secondary | ICD-10-CM

## 2015-02-16 DIAGNOSIS — R531 Weakness: Secondary | ICD-10-CM | POA: Diagnosis not present

## 2015-02-16 DIAGNOSIS — M25571 Pain in right ankle and joints of right foot: Secondary | ICD-10-CM

## 2015-02-16 NOTE — Therapy (Signed)
[place], [place], [address] Phone: [phone]   Fax:  [phone]  Physical Therapy Treatment  Patient Details  Name: Martha Woods MRN: [id number] Date of Birth: [birth year] Referring Provider: Dr. Georgina Snell  Encounter Date: 02/16/2015      PT End of Session - 02/16/15 0810    Visit Number 6   Number of Visits 12   Date for PT Re-Evaluation 03/13/15   PT Start Time 0804   PT Stop Time [id number]   PT Time Calculation (min) 43 min   Equipment Utilized During Treatment --   Activity Tolerance Patient tolerated treatment well      Past Medical History  Diagnosis Date  . Cancer [facility]) 02-2009    papillary thyroid cancer, Dr. Malachy Mood  . Cervical spondylosis   . Lumbar spondylosis   . Diabetes mellitus     Gestattional Diabetes, insulin dependent  . Lumbar disc herniation   . Seborrheic dermatitis   . Abnormal Pap smear     cervical cryo    Past Surgical History  Procedure Laterality Date  . Total thyroidectomy  02-2009    thyroid cancer  . Plantar fascitis  01-2010    L (Dr. Wardell Honour)  . Ett      neg for ischemia -7 mets pelvic u/s normal    There were no vitals filed for this visit.  Visit Diagnosis:  Pain in joint, ankle and foot, right  Swelling of ankle, right  Weakness generalized  Stiffness of ankle joint, right      Subjective Assessment - 02/16/15 0810    Subjective Pt reports her Rt ankle is feeling a lot better; has held off on ankle exercises.  "I only had a little twinge last night".   "I think the pain happens when all my weight is on it (Rt foot) and I move a funny way".    Currently in Pain? Yes   Pain Score 2    Pain Location Ankle   Pain Orientation Right;Medial   Pain Descriptors / Indicators Aching            [facility] PT Assessment - 02/16/15 0001    Assessment   Medical Diagnosis Rt posterior tibial tendonitis   Referring Provider Dr. Georgina Snell   Onset Date/Surgical Date 08/30/14   Hand Dominance Right   Next MD Visit PRN          [facility] Adult PT Treatment/Exercise - 02/16/15 0001    Knee/Hip Exercises: Stretches   Passive Hamstring Stretch Right;2 reps;30 seconds   Quad Stretch Right;2 reps;30 seconds   Other Knee/Hip Stretches Rt ITB stretch x 30 sec x 2 reps    Knee/Hip Exercises: Standing   Step Down Right;1 set;5 reps;Hand Hold: 2  3" step   Knee/Hip Exercises: Supine   Straight Leg Raises Right;1 set;10 reps   Knee/Hip Exercises: Sidelying   Hip ABduction Strengthening;Right;2 sets;10 reps   Clams Right, 10 reps    Modalities   Modalities Iontophoresis;Ultrasound   Ultrasound   Ultrasound Location Rt medial ankle   Ultrasound Parameters 50% 3.3 mHz, 1.1 w/cm2    Ultrasound Goals Pain   Iontophoresis   Type of Iontophoresis Dexamethasone   Location Rt medial ankle    Dose 1.0 cc    Time 6 hr patch   Manual Therapy   Manual Therapy Soft tissue mobilization   Soft tissue mobilization to Rt anterior tib/ fibularis muscles  with TPR to tight areas.  Ankle Exercises: Stretches   Other Stretch Hamstring stretch x 20 sec x 2 reps each leg.              PT Long Term Goals - 02/02/15 0915    PT LONG TERM GOAL #1   Title I with advanced HEP ( 03/13/15)    Time 6   Period Weeks   Status On-going   PT LONG TERM GOAL #2   Title increase Rt ankle ROM to WNL without pain ( 03/13/15)    Time 6   Period Weeks   Status On-going   PT LONG TERM GOAL #3   Title increase Rt LE strength =/> 5-/5 ( 03/13/15)    Time 6   Period Weeks   Status On-going   PT LONG TERM GOAL #4   Title report =/> 75% reduction in Rt ankle pain to allow return to walking program ( 03/13/15)    Time 6   Period Weeks   Status On-going   PT LONG TERM GOAL #5   Title improve FOTO =/< 41% limited ( 03/13/15)    Time 6   Period Weeks   Status On-going               Plan - 02/16/15 1240    Clinical Impression Statement Pt  tolerated all exercises well, except heel taps on 3" step - increased pain in knees and Rt ankle. Pt has completed 5 ionto treatments with positive response.  Pt was point tender in anterior tib of Rt with manual therapy.   Pt progressing towards goals.    Pt will benefit from skilled therapeutic intervention in order to improve on the following deficits Decreased strength;Increased edema;Decreased balance;Impaired flexibility;Pain;Increased muscle spasms;Decreased range of motion;Difficulty walking   Rehab Potential Good   PT Frequency 2x / week   PT Duration 6 weeks   PT Treatment/Interventions Ultrasound;Neuromuscular re-education;Gait training;Cryotherapy;Patient/family education;Passive range of motion;Dry needling;Electrical Stimulation;Iontophoresis 4mg /ml Dexamethasone;Therapeutic exercise;Manual techniques;Vasopneumatic Device   PT Next Visit Plan TDN trial.  Continue progressive strengthening/ stretching to RLE.     Consulted and Agree with Plan of Care Patient        Problem List Patient Active Problem List   Diagnosis Date Noted  . Leg length inequality 01/31/2015  . Posterior tibial tendinitis of right lower extremity 01/23/2015  . Thalassemia 11/10/2013  . Migraine headache 08/10/2013  . ASC-cannot exclude HGSIL on Pap 12/24/2011  . Hypothyroid 02/20/2011  . CARCINOMA, THYROID GLAND, PAPILLARY 12/15/2008  . UNSPECIFIED ANEMIA 09/21/2008  . SHOULDER PAIN, RIGHT 12/15/2007  . CARPAL TUNNEL SYNDROME, BILATERAL 08/10/2007  . ARTHRITIS, CERVICAL SPINE 03/30/2006  . Diabetes ([place]) 10/21/2005  . Hyperlipidemia 10/21/2005  . OBESITY, NOS 10/21/2005  . HYPERTENSION, BENIGN SYSTEMIC 10/21/2005  . MENORRHAGIA 10/21/2005    Kerin Perna, PTA 02/16/2015 12:53 PM  [place], [place], [address] Phone: [phone]   Fax:  [phone]  Name: Tihani Polaski MRN: [id number] Date  of Birth: 1960/06/18

## 2015-02-20 ENCOUNTER — Ambulatory Visit (INDEPENDENT_AMBULATORY_CARE_PROVIDER_SITE_OTHER): Payer: BLUE CROSS/BLUE SHIELD | Admitting: Physical Therapy

## 2015-02-20 ENCOUNTER — Encounter: Payer: Self-pay | Admitting: Physical Therapy

## 2015-02-20 DIAGNOSIS — M25571 Pain in right ankle and joints of right foot: Secondary | ICD-10-CM | POA: Diagnosis not present

## 2015-02-20 DIAGNOSIS — M25671 Stiffness of right ankle, not elsewhere classified: Secondary | ICD-10-CM | POA: Diagnosis not present

## 2015-02-20 DIAGNOSIS — R531 Weakness: Secondary | ICD-10-CM | POA: Diagnosis not present

## 2015-02-20 DIAGNOSIS — M25471 Effusion, right ankle: Secondary | ICD-10-CM

## 2015-02-20 NOTE — Therapy (Signed)
[place], [place], [address] Phone: [phone]   Fax:  [phone]  Physical Therapy Treatment  Patient Details  Name: Martha Woods MRN: [id number] Date of Birth: 1960-08-21 Referring Provider: Dr Georgina Snell  Encounter Date: 02/20/2015      PT End of Session - 02/20/15 0805    Visit Number 7   Number of Visits 12   Date for PT Re-Evaluation 03/13/15   PT Start Time 0803   PT Stop Time 0849   PT Time Calculation (min) 46 min   Activity Tolerance Patient tolerated treatment well      Past Medical History  Diagnosis Date  . Cancer [facility]) 02-2009    papillary thyroid cancer, Dr. Malachy Mood  . Cervical spondylosis   . Lumbar spondylosis   . Diabetes mellitus     Gestattional Diabetes, insulin dependent  . Lumbar disc herniation   . Seborrheic dermatitis   . Abnormal Pap smear     cervical cryo    Past Surgical History  Procedure Laterality Date  . Total thyroidectomy  02-2009    thyroid cancer  . Plantar fascitis  01-2010    L (Dr. Wardell Honour)  . Ett      neg for ischemia -7 mets pelvic u/s normal    There were no vitals filed for this visit.  Visit Diagnosis:  Pain in joint, ankle and foot, right  Swelling of ankle, right  Weakness generalized  Stiffness of ankle joint, right      Subjective Assessment - 02/20/15 0804    Subjective Pt states she doesn't have any pain today, did have some on Sunday with walking in the stores.  Martha Woods used ice and rest to settle it down.  She is concerned her mom had a MI yesterday and Martha Woods is supposed to leave Friday    Currently in Pain? No/denies            [facility] PT Assessment - 02/20/15 0001    Assessment   Medical Diagnosis Rt posterior tibial tendonitis   Referring Provider Dr Georgina Snell   Onset Date/Surgical Date 08/30/14   Hand Dominance Right   AROM   AROM Assessment Site Ankle   Right/Left Ankle Right   Right Ankle  Dorsiflexion 8   Right Ankle Plantar Flexion --  WNL   Right Ankle Inversion --  WNL   Right Ankle Eversion --  WNL   Strength   Right Hip Flexion 5/5   Right Hip Extension --  5-/5   Right Hip ABduction 4+/5   Left Hip Flexion 5/5   Left Hip Extension 4+/5   Left Hip ABduction 4+/5                     [facility] Adult PT Treatment/Exercise - 02/20/15 0001    Modalities   Modalities Moist Heat;Iontophoresis   Moist Heat Therapy   Number Minutes Moist Heat 12 Minutes   Moist Heat Location --  Rt lower leg   Iontophoresis   Type of Iontophoresis Dexamethasone   Location Rt medial ankle    Dose 1.0 cc    Time 6 hr patch   Manual Therapy   Manual Therapy Soft tissue mobilization   Soft tissue mobilization to Rt anterior tib/ fibularis muscles  with TPR to tight areas. & gastroc, increased tissue mobility after   Ankle Exercises: Aerobic   Stationary Bike NuStep L4: 5 min    Ankle Exercises: Standing  Heel Raises 10 reps  each on step, toes straight, out & in   Ankle Exercises: Stretches   Gastroc Stretch 1 rep;30 seconds  on prostretch each side   Ankle Exercises: Seated   Other Seated Ankle Exercises green band, 2x10 eversion/inversion          Trigger Point Dry Needling - 02/20/15 0815    Consent Given? Yes   Education Handout Provided Yes   Muscles Treated Lower Body Tibialis anterior;Gastrocnemius  posterior tibialis   Gastrocnemius Response Twitch response elicited;Palpable increased muscle length   Tibialis Anterior Response --  minimal to no reaction, strong twitch in post tib              PT Education - 02/20/15 0807    Education provided Yes   Education Details TDN   Person(s) Educated Patient   Methods Explanation;Handout   Comprehension Verbalized understanding             PT Long Term Goals - 02/20/15 0817    PT LONG TERM GOAL #1   Title I with advanced HEP ( 03/13/15)    Status On-going   PT LONG TERM GOAL #2   Title  increase Rt ankle ROM to WNL without pain ( 03/13/15)    Status On-going  no pain with ROM, WNL in all directions except dorsiflexion   PT LONG TERM GOAL #3   Title increase Rt LE strength =/> 5-/5 ( 03/13/15)    Status On-going   PT LONG TERM GOAL #4   Title report =/> 75% reduction in Rt ankle pain to allow return to walking program ( 03/13/15)    Status On-going  70% improvement   PT LONG TERM GOAL #5   Title improve FOTO =/< 41% limited ( 03/13/15)    Status On-going               Plan - 02/20/15 [id number]    Clinical Impression Statement Martha Woods had some nice releases with TDN in the Rt posterior tib and gastroc.  She is showing slow progress to her goals.  Patient will be seen for one more visit, then out of town for a week.     Pt will benefit from skilled therapeutic intervention in order to improve on the following deficits Decreased strength;Increased edema;Decreased balance;Impaired flexibility;Pain;Increased muscle spasms;Decreased range of motion;Difficulty walking   Rehab Potential Good   PT Frequency 2x / week   PT Duration 6 weeks   PT Treatment/Interventions Ultrasound;Neuromuscular re-education;Gait training;Cryotherapy;Patient/family education;Passive range of motion;Dry needling;Electrical Stimulation;Iontophoresis 4mg /ml Dexamethasone;Therapeutic exercise;Manual techniques;Vasopneumatic Device   PT Next Visit Plan Assess response to TDN, Rt ankle mobs posterior talus    Consulted and Agree with Plan of Care Patient        Problem List Patient Active Problem List   Diagnosis Date Noted  . Leg length inequality 01/31/2015  . Posterior tibial tendinitis of right lower extremity 01/23/2015  . Thalassemia 11/10/2013  . Migraine headache 08/10/2013  . ASC-cannot exclude HGSIL on Pap 12/24/2011  . Hypothyroid 02/20/2011  . CARCINOMA, THYROID GLAND, PAPILLARY 12/15/2008  . UNSPECIFIED ANEMIA 09/21/2008  . SHOULDER PAIN, RIGHT 12/15/2007  . CARPAL TUNNEL SYNDROME,  BILATERAL 08/10/2007  . ARTHRITIS, CERVICAL SPINE 03/30/2006  . Diabetes ([place]) 10/21/2005  . Hyperlipidemia 10/21/2005  . OBESITY, NOS 10/21/2005  . HYPERTENSION, BENIGN SYSTEMIC 10/21/2005  . MENORRHAGIA 10/21/2005    Jeral Pinch PT 02/20/2015, 8:45 AM  [facility] [place], [place], [address]  Phone: [phone]   Fax:  [phone]  Name: Martha Woods MRN: [id number] Date of Birth: [birth year]

## 2015-02-20 NOTE — Patient Instructions (Signed)

## 2015-02-22 ENCOUNTER — Encounter: Payer: BLUE CROSS/BLUE SHIELD | Admitting: Physical Therapy

## 2015-03-07 ENCOUNTER — Ambulatory Visit: Payer: BLUE CROSS/BLUE SHIELD | Admitting: Family Medicine

## 2015-03-14 ENCOUNTER — Telehealth: Payer: Self-pay | Admitting: *Deleted

## 2015-03-14 ENCOUNTER — Ambulatory Visit (INDEPENDENT_AMBULATORY_CARE_PROVIDER_SITE_OTHER): Payer: BLUE CROSS/BLUE SHIELD | Admitting: Physical Therapy

## 2015-03-14 DIAGNOSIS — M25671 Stiffness of right ankle, not elsewhere classified: Secondary | ICD-10-CM | POA: Diagnosis not present

## 2015-03-14 DIAGNOSIS — M25571 Pain in right ankle and joints of right foot: Secondary | ICD-10-CM | POA: Diagnosis not present

## 2015-03-14 DIAGNOSIS — M25471 Effusion, right ankle: Secondary | ICD-10-CM

## 2015-03-14 DIAGNOSIS — R531 Weakness: Secondary | ICD-10-CM

## 2015-03-14 NOTE — Telephone Encounter (Signed)
Submitting a PA for [place]. There is a good possibility it may be denied. If so per Dr. Madilyn Fireman can consider [employer] or [employer]

## 2015-03-14 NOTE — Therapy (Addendum)
[place], [place], [address] Phone: [phone]   Fax:  [phone]  Physical Therapy Treatment  Patient Details  Name: Martha Woods MRN: [id number] Date of Birth: 03/26/1960 Referring Provider: Dr. Georgina Snell  Encounter Date: 03/14/2015      PT End of Session - 03/14/15 0802    Visit Number 8   Number of Visits 12   PT Start Time 0802   PT Stop Time 9892   PT Time Calculation (min) 47 min   Activity Tolerance Patient limited by pain  (in Rt knee)      Past Medical History  Diagnosis Date  . Cancer [facility]) 02-2009    papillary thyroid cancer, Dr. Malachy Mood  . Cervical spondylosis   . Lumbar spondylosis   . Diabetes mellitus     Gestattional Diabetes, insulin dependent  . Lumbar disc herniation   . Seborrheic dermatitis   . Abnormal Pap smear     cervical cryo    Past Surgical History  Procedure Laterality Date  . Total thyroidectomy  02-2009    thyroid cancer  . Plantar fascitis  01-2010    L (Dr. Wardell Honour)  . Ett      neg for ischemia -7 mets pelvic u/s normal    There were no vitals filed for this visit.  Visit Diagnosis:  Stiffness of ankle joint, right  Pain in joint, ankle and foot, right  Swelling of ankle, right  Weakness generalized      Subjective Assessment - 03/14/15 0805    Subjective Pt reported that she didn't have any pain while away on trip to [place].  Some pain in Rt ankle returning only when driving (up to 1/19).  Fell on Rt knee yesterday and is in some pain from this.  Pt had positive response with TDN, felt  it "released all the knots".   Pt reports she is ready to discharge today to HEP.    Currently in Pain? Yes   Pain Score 5   up to 10/10 with movement    Pain Location Knee   Pain Orientation Right;Anterior   Pain Descriptors / Indicators Sharp   Aggravating Factors  walking    Pain Relieving Factors rest, ice             [facility]  PT Assessment - 03/14/15 0001    Assessment   Medical Diagnosis Rt posterior tibial tendonitis   Referring Provider Dr. Georgina Snell   Onset Date/Surgical Date 08/30/14   Hand Dominance Right   Next MD Visit PRN   Observation/Other Assessments   Focus on Therapeutic Outcomes (FOTO)  38% limited   AROM   AROM Assessment Site Ankle   Right/Left Ankle Right   Right Ankle Dorsiflexion 10   Strength   Strength Assessment Site Hip   Right/Left Hip Right;Left   Right Hip Flexion 5/5   Right Hip Extension 5/5   Right Hip ABduction --  unable to test due to Rt knee pain   Left Hip Flexion 5/5   Left Hip Extension 5/5   Left Hip ABduction 5/5   Right/Left Ankle Right   Right Ankle Dorsiflexion 5/5          [facility] Adult PT Treatment/Exercise - 03/14/15 0001    Knee/Hip Exercises: Stretches   Passive Hamstring Stretch Right;30 seconds;3 reps  supine with strap   Quad Stretch Right;3 reps;30 seconds   Gastroc Stretch Right;1 rep;60 seconds   Soleus Stretch Right;1 rep;60 seconds  Other Knee/Hip Stretches Rt adductor stretch x 30 sec x 2   Modalities   Modalities Ultrasound;Cryotherapy   Cryotherapy   Number Minutes Cryotherapy 15 Minutes   Cryotherapy Location Knee   Type of Cryotherapy Ice pack   Ultrasound   Ultrasound Location Rt medial ankle    Ultrasound Parameters 100%, 3.3 mHz, 1.2 w/cm x 8 min    Ultrasound Goals Pain, point tender to touch   Ankle Exercises: Supine   T-Band Rt ankle eversion and inversion x 10 reps with green band             PT Long Term Goals - 03/14/15 0850    PT LONG TERM GOAL #1   Title I with advanced HEP ( 03/13/15)    Time 6   Period Weeks   Status Achieved   PT LONG TERM GOAL #2   Title increase Rt ankle ROM to WNL without pain ( 03/13/15)    Time 6   Period Weeks   Status Achieved   PT LONG TERM GOAL #3   Title increase Rt LE strength =/> 5-/5 ( 03/13/15)    Time 6   Period Weeks   Status Partially Met  Unable to assess Rt hip abd,  Rt knee due to recent fall   PT LONG TERM GOAL #4   Title report =/> 75% reduction in Rt ankle pain to allow return to walking program ( 03/13/15)    Time 6   Period Weeks   Status Achieved   PT LONG TERM GOAL #5   Title improve FOTO =/< 41% limited ( 03/13/15)    Time 6   Period Weeks   Status Achieved               Plan - 03/14/15 0915    Clinical Impression Statement Pt demonstrated improved Rt ankle DF and hip strength.  Unable to test hip abduction due to tenderness from a fall yesterday.  Pt has partially met her goals, and requests to d/c to HEP at this time.    Pt will benefit from skilled therapeutic intervention in order to improve on the following deficits Decreased strength;Increased edema;Decreased balance;Impaired flexibility;Pain;Increased muscle spasms;Decreased range of motion;Difficulty walking   Rehab Potential Good   PT Frequency 2x / week   PT Duration 6 weeks   PT Treatment/Interventions Ultrasound;Neuromuscular re-education;Gait training;Cryotherapy;Patient/family education;Passive range of motion;Dry needling;Electrical Stimulation;Iontophoresis 54m/ml Dexamethasone;Therapeutic exercise;Manual techniques;Vasopneumatic Device   PT Next Visit Plan spoke to supervising PT; will d/c to HEP at this time.    Consulted and Agree with Plan of Care Patient        Problem List Patient Active Problem List   Diagnosis Date Noted  . Leg length inequality 01/31/2015  . Posterior tibial tendinitis of right lower extremity 01/23/2015  . Thalassemia 11/10/2013  . Migraine headache 08/10/2013  . ASC-cannot exclude HGSIL on Pap 12/24/2011  . Hypothyroid 02/20/2011  . CARCINOMA, THYROID GLAND, PAPILLARY 12/15/2008  . UNSPECIFIED ANEMIA 09/21/2008  . SHOULDER PAIN, RIGHT 12/15/2007  . CARPAL TUNNEL SYNDROME, BILATERAL 08/10/2007  . ARTHRITIS, CERVICAL SPINE 03/30/2006  . Diabetes ([place] 10/21/2005  . Hyperlipidemia 10/21/2005  . OBESITY, NOS 10/21/2005  .  HYPERTENSION, BENIGN SYSTEMIC 10/21/2005  . MENORRHAGIA 10/21/2005    JKerin Perna PTA 03/14/2015 9:25 AM  [id number] [address]: [phone]  Fax:  [phone] Name: DMarisela LineMRN: [id number] of Birth: 120-Apr-1962   PHYSICAL  THERAPY DISCHARGE SUMMARY  Visits from Start of Care: 8  Current functional level related to goals / functional outcomes: See above   Remaining deficits: none   Education / Equipment: HEP Plan: Patient agrees to discharge.  Patient goals were met. Patient is being discharged due to meeting the stated rehab goals.  ?????   Jeral Pinch, PT 04/02/2015 8:13 AM

## 2015-03-16 ENCOUNTER — Encounter: Payer: BLUE CROSS/BLUE SHIELD | Admitting: Physical Therapy

## 2015-03-20 NOTE — Telephone Encounter (Signed)
Just remembered the rep can help with this. Called Jonelle Sidle and gave her patient's info. She will get back to [place]

## 2015-03-23 NOTE — Telephone Encounter (Signed)
Martha Woods called back and states the pharmacy said they didn't even have this on file there; called the patient and she states she was getting the medication free at [facility] but then she switched to [facility] where her daughter works. She is is able to get the medication for free here and she just got a refill.  She asked that I hold off on the PA right now as her daughter was able to do some type of overide. She is still able to get med for free.  So disregard any PA's seen for this patient on faxiga

## 2015-04-18 ENCOUNTER — Ambulatory Visit (INDEPENDENT_AMBULATORY_CARE_PROVIDER_SITE_OTHER): Payer: BLUE CROSS/BLUE SHIELD | Admitting: Family Medicine

## 2015-04-18 ENCOUNTER — Encounter: Payer: Self-pay | Admitting: Family Medicine

## 2015-04-18 VITALS — BP 110/72 | HR 87 | Wt 221.0 lb

## 2015-04-18 DIAGNOSIS — F4321 Adjustment disorder with depressed mood: Secondary | ICD-10-CM

## 2015-04-18 DIAGNOSIS — G43909 Migraine, unspecified, not intractable, without status migrainosus: Secondary | ICD-10-CM | POA: Diagnosis not present

## 2015-04-18 DIAGNOSIS — E119 Type 2 diabetes mellitus without complications: Secondary | ICD-10-CM

## 2015-04-18 DIAGNOSIS — I10 Essential (primary) hypertension: Secondary | ICD-10-CM | POA: Diagnosis not present

## 2015-04-18 LAB — POCT GLYCOSYLATED HEMOGLOBIN (HGB A1C): Hemoglobin A1C: 6.7

## 2015-04-18 NOTE — Progress Notes (Signed)
   Subjective:    Patient ID: Martha Woods, female    DOB: 1960/05/05, 55 y.o.   MRN: [id number]  HPI Diabetes - no hypoglycemic events. No wounds or sores that are not healing well. No increased thirst or urination. Checking glucose at home. Taking medications as prescribed without any side effects.  She has lost 3 lbs.   Hypertension- Pt denies chest pain, SOB, dizziness, or heart palpitations.  Taking meds as directed w/o problems.  Denies medication side effects.    Migraine headaches-they have been under really fantastic control. Her Norvasc was decreased to 2.5 mg and she still remained really well controlled on that dose. She wants to stop her norvasc.   Review of Systems     Objective:   Physical Exam  Constitutional: She is oriented to person, place, and time. She appears well-developed and well-nourished.  HENT:  Head: Normocephalic and atraumatic.  Cardiovascular: Normal rate, regular rhythm and normal heart sounds.   Pulmonary/Chest: Effort normal and breath sounds normal.  Neurological: She is alert and oriented to person, place, and time.  Skin: Skin is warm and dry.  Psychiatric: She has a normal mood and affect. Her behavior is normal.          Assessment & Plan:  DM- Well-controlled. Hemoglobin A1c of 6.7 today. Continue current regimen. Follow-up in 3 months. Now that her foot is doing better after physical therapy and custom orthotic she is hoping to exercise more regularly. That she is Martha Woods lost 3 more pounds which is fantastic. Follow-up in 3 months.  HTN - well controlled.  Migraine headaches-okay to stop Norvasc and just monitor for any increased frequency and migraines.  greiving - unfortunately her mother passed away recently right before her 39th birthday. It was unexpected. She feels like she is doing okay. Offered to refer her for therapy or counseling. She declined at this time but just let her know that she can call us at any point time if  she feels like she is struggling.  Reminded her to schedule her Pap smear for sometime this spring either with [place] or with GYN.

## 2015-05-10 ENCOUNTER — Telehealth: Payer: Self-pay | Admitting: *Deleted

## 2015-05-10 NOTE — Telephone Encounter (Signed)
Prior auth faxed for [employer]

## 2015-05-16 NOTE — Telephone Encounter (Signed)
Jan[employer] has been approved through 05/08/2025    Left message on patient's vm

## 2015-07-07 ENCOUNTER — Other Ambulatory Visit: Payer: Self-pay | Admitting: Family Medicine

## 2015-07-18 ENCOUNTER — Other Ambulatory Visit: Payer: Self-pay | Admitting: *Deleted

## 2015-07-18 MED ORDER — DAPAGLIFLOZIN PROPANEDIOL 5 MG PO TABS: 5.0000 mg | ORAL_TABLET | Freq: Every day | ORAL | Status: DC

## 2015-07-20 ENCOUNTER — Other Ambulatory Visit: Payer: Self-pay | Admitting: Family Medicine

## 2015-07-24 ENCOUNTER — Other Ambulatory Visit: Payer: Self-pay | Admitting: Family Medicine

## 2015-07-24 ENCOUNTER — Encounter: Payer: Self-pay | Admitting: Family Medicine

## 2015-07-24 ENCOUNTER — Ambulatory Visit (INDEPENDENT_AMBULATORY_CARE_PROVIDER_SITE_OTHER): Payer: BLUE CROSS/BLUE SHIELD | Admitting: Family Medicine

## 2015-07-24 ENCOUNTER — Other Ambulatory Visit (HOSPITAL_COMMUNITY)
Admission: RE | Admit: 2015-07-24 | Discharge: 2015-07-24 | Disposition: A | Discharge status: Home / Self Care | Payer: BLUE CROSS/BLUE SHIELD | Source: Ambulatory Visit | Attending: Family Medicine | Admitting: Family Medicine

## 2015-07-24 VITALS — BP 118/71 | HR 86 | Wt 227.0 lb

## 2015-07-24 DIAGNOSIS — Z01419 Encounter for gynecological examination (general) (routine) without abnormal findings: Secondary | ICD-10-CM | POA: Diagnosis present

## 2015-07-24 DIAGNOSIS — Z124 Encounter for screening for malignant neoplasm of cervix: Secondary | ICD-10-CM | POA: Diagnosis not present

## 2015-07-24 DIAGNOSIS — Z1231 Encounter for screening mammogram for malignant neoplasm of breast: Secondary | ICD-10-CM

## 2015-07-24 DIAGNOSIS — Z1151 Encounter for screening for human papillomavirus (HPV): Secondary | ICD-10-CM | POA: Diagnosis not present

## 2015-07-24 DIAGNOSIS — I10 Essential (primary) hypertension: Secondary | ICD-10-CM | POA: Diagnosis not present

## 2015-07-24 DIAGNOSIS — E119 Type 2 diabetes mellitus without complications: Secondary | ICD-10-CM

## 2015-07-24 LAB — BASIC METABOLIC PANEL
BUN: 17 mg/dL (ref 7–25)
CO2: 27 mmol/L (ref 20–31)
Calcium: 8.6 mg/dL (ref 8.6–10.4)
Chloride: 103 mmol/L (ref 98–110)
Creat: 0.73 mg/dL (ref 0.50–1.05)
Glucose, Bld: 106 mg/dL — ABNORMAL HIGH (ref 65–99)
Potassium: 4.2 mmol/L (ref 3.5–5.3)
Sodium: 140 mmol/L (ref 135–146)

## 2015-07-24 LAB — POCT GLYCOSYLATED HEMOGLOBIN (HGB A1C): Hemoglobin A1C: 6.3

## 2015-07-24 MED ORDER — NYSTATIN 100000 UNIT/GM EX POWD: CUTANEOUS | Status: DC

## 2015-07-24 NOTE — Progress Notes (Signed)
Subjective:    CC:   HPI:  Diabetes - no hypoglycemic events. No wounds or sores that are not healing well. No increased thirst or urination. Checking glucose at home. Taking medications as prescribed without any side effects. She is on [place].  Lab Results  Component Value Date   HGBA1C 6.7 04/18/2015   Due for cervical cancer screening. Pap smear scheduled for today. She did have a Pap smear in 2013 which showed ASCUS not rule out high-grade lesion. This was then followed by a colposcopy in January 2014. Biopsies were negative. She was supposed to have a repeat Pap smear in one year but did not. She is here today for Pap smear.She does have a history of cryotherapy treatment of the cervix around 2000.    Hypertension- Pt denies chest pain, SOB, dizziness, or heart palpitations.  Taking meds as directed w/o problems.  Denies medication side effects.     Past medical history, Surgical history, Family history not pertinant except as noted below, Social history, Allergies, and medications have been entered into the medical record, reviewed, and corrections made.   Review of Systems: No fevers, chills, night sweats, weight loss, chest pain, or shortness of breath.   Objective:    General: Well Developed, well nourished, and in no acute distress.  Neuro: Alert and oriented x3, extra-ocular muscles intact, sensation grossly intact.  HEENT: Normocephalic, atraumatic  Skin: Warm and dry, no rashes. Cardiac: Regular rate and rhythm, no murmurs rubs or gallops, no lower extremity edema.  Respiratory: Clear to auscultation bilaterally. Not using accessory muscles, speaking in full sentences.  Physical Exam  Genitourinary: Vagina normal and uterus normal. Pelvic exam was performed with patient supine. There is no rash or lesion on the right labia. There is no rash or lesion on the left labia. Cervix exhibits no motion tenderness, no discharge and no friability. Right adnexum displays  no mass, no tenderness and no fullness. Left adnexum displays no mass, no tenderness and no fullness.      Impression and Recommendations:   DM-   Well-controlled. 11 A1c of 6.3 today which is fantastic. On ACE, statin, and ASA.    HTN -  Well controlled. Continue current regimen. Follow up in 3-4 months.   Cervical Cancer Screening - Pap smear performed today. She's not having any pelvic pain problems abnormal discharge etc. Will call with results once available.

## 2015-07-24 NOTE — Progress Notes (Signed)
Quick Note:  All labs are normal. ______ 

## 2015-07-26 LAB — CYTOLOGY - PAP

## 2015-07-27 NOTE — Progress Notes (Signed)
Quick Note:  Call patient: Your Pap smear is normal. Repeat in 5 years. ______ 

## 2015-08-24 ENCOUNTER — Ambulatory Visit (INDEPENDENT_AMBULATORY_CARE_PROVIDER_SITE_OTHER): Payer: BLUE CROSS/BLUE SHIELD

## 2015-08-24 DIAGNOSIS — Z1231 Encounter for screening mammogram for malignant neoplasm of breast: Secondary | ICD-10-CM

## 2015-08-31 ENCOUNTER — Telehealth: Payer: Self-pay | Admitting: *Deleted

## 2015-08-31 NOTE — Telephone Encounter (Signed)
PA initiated through the [place] hotline [phone]. They only our office back if there is a problem with processing the request. They will take care of all processing.

## 2015-09-20 NOTE — Telephone Encounter (Signed)
Received fax form to be completed or [place] PA. Form completed and faxed back.

## 2015-10-02 LAB — HM DIABETES EYE EXAM

## 2015-10-16 ENCOUNTER — Encounter: Payer: Self-pay | Admitting: Family Medicine

## 2015-10-19 ENCOUNTER — Encounter: Payer: Self-pay | Admitting: Family Medicine

## 2015-10-24 ENCOUNTER — Encounter: Payer: Self-pay | Admitting: Family Medicine

## 2015-10-24 ENCOUNTER — Ambulatory Visit (INDEPENDENT_AMBULATORY_CARE_PROVIDER_SITE_OTHER): Payer: BLUE CROSS/BLUE SHIELD | Admitting: Family Medicine

## 2015-10-24 VITALS — BP 121/68 | HR 94 | Wt 230.0 lb

## 2015-10-24 DIAGNOSIS — H409 Unspecified glaucoma: Secondary | ICD-10-CM | POA: Diagnosis not present

## 2015-10-24 DIAGNOSIS — E119 Type 2 diabetes mellitus without complications: Secondary | ICD-10-CM

## 2015-10-24 DIAGNOSIS — Z6839 Body mass index (BMI) 39.0-39.9, adult: Secondary | ICD-10-CM

## 2015-10-24 DIAGNOSIS — E7849 Other hyperlipidemia: Secondary | ICD-10-CM

## 2015-10-24 DIAGNOSIS — E784 Other hyperlipidemia: Secondary | ICD-10-CM

## 2015-10-24 DIAGNOSIS — I10 Essential (primary) hypertension: Secondary | ICD-10-CM

## 2015-10-24 LAB — POCT UA - MICROALBUMIN
Albumin/Creatinine Ratio, Urine, POC: <30
Creatinine, POC: 50 mg/dL
Microalbumin Ur, POC: 10 mg/L

## 2015-10-24 LAB — POCT GLYCOSYLATED HEMOGLOBIN (HGB A1C): Hemoglobin A1C: 6.5

## 2015-10-24 MED ORDER — LISINOPRIL 20 MG PO TABS: ORAL_TABLET | ORAL | 2 refills | Status: DC

## 2015-10-24 NOTE — Progress Notes (Signed)
  Subjective:    CC: DM, HTN  HPI: Diabetes - no hypoglycemic events. No wounds or sores that are not healing well. No increased thirst or urination. Checking glucose at home. Taking medications as prescribed without any side effects.  Hypertension- Pt denies chest pain, SOB, dizziness, or heart palpitations.  Taking meds as directed w/o problems.  Denies medication side effects.    Obesity/BMI 38 - she has gained a few pounds since I last saw her. She is not exercising.  She has been working long hours.    She did have her eye exam recently and unfortunately she was diagnosed with newly onset glaucoma in both eyes. She has been started on eyedrops.  Past medical history, Surgical history, Family history not pertinant except as noted below, Social history, Allergies, and medications have been entered into the medical record, reviewed, and corrections made.   Review of Systems: No fevers, chills, night sweats, weight loss, chest pain, or shortness of breath.   Objective:    General: Well Developed, well nourished, and in no acute distress.  Neuro: Alert and oriented x3, extra-ocular muscles intact, sensation grossly intact.  HEENT: Normocephalic, atraumatic , Some mild conjunctival inflammation. Skin: Warm and dry, no rashes. Cardiac: Regular rate and rhythm, no murmurs rubs or gallops, no lower extremity edema.  Respiratory: Clear to auscultation bilaterally. Not using accessory muscles, speaking in full sentences.   Impression and Recommendations:   DM- Well controlled. Continue current regimen. Follow up in  4 mo. Getting flu shot tomorrow.   HTN - Well controlled. Continue current regimen. Follow up in  4 mo  Obesity/BMI 38 -  Encouraged her to start exercising for at least 15 minutes twice a week as a start. Encouraged her to start putting herself first and helping take care of her own body. Make sure eating healthy.

## 2015-12-10 ENCOUNTER — Encounter: Payer: Self-pay | Admitting: Family Medicine

## 2015-12-10 ENCOUNTER — Ambulatory Visit (INDEPENDENT_AMBULATORY_CARE_PROVIDER_SITE_OTHER): Payer: BLUE CROSS/BLUE SHIELD | Admitting: Family Medicine

## 2015-12-10 ENCOUNTER — Ambulatory Visit (INDEPENDENT_AMBULATORY_CARE_PROVIDER_SITE_OTHER): Payer: BLUE CROSS/BLUE SHIELD

## 2015-12-10 DIAGNOSIS — M5442 Lumbago with sciatica, left side: Secondary | ICD-10-CM

## 2015-12-10 DIAGNOSIS — M4696 Unspecified inflammatory spondylopathy, lumbar region: Secondary | ICD-10-CM

## 2015-12-10 DIAGNOSIS — M48061 Spinal stenosis, lumbar region without neurogenic claudication: Secondary | ICD-10-CM

## 2015-12-10 DIAGNOSIS — M479 Spondylosis, unspecified: Secondary | ICD-10-CM

## 2015-12-10 NOTE — Patient Instructions (Addendum)
Thank you for coming in today. Attend PT.  Return for recheck in 2-3 weeks.  Get xray today.  Use a heating pad.    Sciatica Sciatica is pain, numbness, weakness, or tingling along the path of the sciatic nerve. The sciatic nerve starts in the lower back and runs down the back of each leg. The nerve controls the muscles in the lower leg and in the back of the knee. It also provides feeling (sensation) to the back of the thigh, the lower leg, and the sole of the foot. Sciatica is a symptom of another medical condition that pinches or puts pressure on the sciatic nerve. Generally, sciatica only affects one side of the body. Sciatica usually goes away on its own or with treatment. In some cases, sciatica may keep coming back (recur). What are the causes? This condition is caused by pressure on the sciatic nerve, or pinching of the sciatic nerve. This may be the result of:  A disk in between the bones of the spine (vertebrae) bulging out too far (herniated disk).  Age-related changes in the spinal disks (degenerative disk disease).  A pain disorder that affects a muscle in the buttock (piriformis syndrome).  Extra bone growth (bone spur) near the sciatic nerve.  An injury or break (fracture) of the pelvis.  Pregnancy.  Tumor (rare). What increases the risk? The following factors may make you more likely to develop this condition:  Playing sports that place pressure or stress on the spine, such as football or weight lifting.  Having poor strength and flexibility.  A history of back injury.  A history of back surgery.  Sitting for long periods of time.  Doing activities that involve repetitive bending or lifting.  Obesity. What are the signs or symptoms? Symptoms can vary from mild to very severe, and they may include:  Any of these problems in the lower back, leg, hip, or buttock:  Mild tingling or dull aches.  Burning sensations.  Sharp pains.  Numbness in the back of  the calf or the sole of the foot.  Leg weakness.  Severe back pain that makes movement difficult. These symptoms may get worse when you cough, sneeze, or laugh, or when you sit or stand for long periods of time. Being overweight may also make symptoms worse. In some cases, symptoms may recur over time. How is this diagnosed? This condition may be diagnosed based on:  Your symptoms.  A physical exam. Your health care provider may ask you to do certain movements to check whether those movements trigger your symptoms.  You may have tests, including:  Blood tests.  X-rays.  MRI.  CT scan. How is this treated? In many cases, this condition improves on its own, without any treatment. However, treatment may include:  Reducing or modifying physical activity during periods of pain.  Exercising and stretching to strengthen your abdomen and improve the flexibility of your spine.  Icing and applying heat to the affected area.  Medicines that help:  To relieve pain and swelling.  To relax your muscles.  Injections of medicines that help to relieve pain, irritation, and inflammation around the sciatic nerve (steroids).  Surgery. Follow these instructions at home: Medicines  Take over-the-counter and prescription medicines only as told by your health care provider.  Do not drive or operate heavy machinery while taking prescription pain medicine. Managing pain  If directed, apply ice to the affected area.  Put ice in a plastic bag.  Place a towel  between your skin and the bag.  Leave the ice on for 20 minutes, 2-3 times a day.  After icing, apply heat to the affected area before you exercise or as often as told by your health care provider. Use the heat source that your health care provider recommends, such as a moist heat pack or a heating pad.  Place a towel between your skin and the heat source.  Leave the heat on for 20-30 minutes.  Remove the heat if your skin turns  bright red. This is especially important if you are unable to feel pain, heat, or cold. You may have a greater risk of getting burned. Activity  Return to your normal activities as told by your health care provider. Ask your health care provider what activities are safe for you.  Avoid activities that make your symptoms worse.  Take brief periods of rest throughout the day. Resting in a lying or standing position is usually better than sitting to rest.  When you rest for longer periods, mix in some mild activity or stretching between periods of rest. This will help to prevent stiffness and pain.  Avoid sitting for long periods of time without moving. Get up and move around at least one time each hour.  Exercise and stretch regularly, as told by your health care provider.  Do not lift anything that is heavier than 10 lb (4.5 kg) while you have symptoms of sciatica. When you do not have symptoms, you should still avoid heavy lifting, especially repetitive heavy lifting.  When you lift objects, always use proper lifting technique, which includes:  Bending your knees.  Keeping the load close to your body.  Avoiding twisting. General instructions  Use good posture.  Avoid leaning forward while sitting.  Avoid hunching over while standing.  Maintain a healthy weight. Excess weight puts extra stress on your back and makes it difficult to maintain good posture.  Wear supportive, comfortable shoes. Avoid wearing high heels.  Avoid sleeping on a mattress that is too soft or too hard. A mattress that is firm enough to support your back when you sleep may help to reduce your pain.  Keep all follow-up visits as told by your health care provider. This is important. Contact a health care provider if:  You have pain that wakes you up when you are sleeping.  You have pain that gets worse when you lie down.  Your pain is worse than you have experienced in the past.  Your pain lasts  longer than 4 weeks.  You experience unexplained weight loss. Get help right away if:  You lose control of your bowel or bladder (incontinence).  You have:  Weakness in your lower back, pelvis, buttocks, or legs that gets worse.  Redness or swelling of your back.  A burning sensation when you urinate. This information is not intended to replace advice given to you by your health care provider. Make sure you discuss any questions you have with your health care provider. Document Released: 12/24/2000 Document Revised: 06/05/2015 Document Reviewed: 09/08/2014 Elsevier Interactive Patient Education  2017 [employer].

## 2015-12-10 NOTE — Progress Notes (Signed)
Martha Woods is a 55 y.o. female who presents to [facility] today for leg pain.  She woke up yesterday morning with burning pain in her left posterior calf from her ankle to her knee. She also felt weak and could not walk. Took alleve and ibuprofen with no improvement. This morning, the pain and feeling of weakness extended to the back of her thigh and hip, associated with lower back pain. No numbness, bowel or bladder incontinence, or saddle anesthesia.  She has a history of lower back pain in the same area that has not bothered her in years. She also had an episode of sudden onset bilateral leg weakness years ago that self resolved.   Past Medical History:  Diagnosis Date  . Abnormal Pap smear    cervical cryo  . Cancer [facility]) 02-2009   papillary thyroid cancer, Dr. Malachy Mood  . Cervical spondylosis   . Diabetes mellitus    Gestattional Diabetes, insulin dependent  . Lumbar disc herniation   . Lumbar spondylosis   . Seborrheic dermatitis    Past Surgical History:  Procedure Laterality Date  . ett     neg for ischemia -7 mets pelvic u/s normal  . plantar fascitis  01-2010   L (Dr. Wardell Honour)  . TOTAL THYROIDECTOMY  02-2009   thyroid cancer   Social History  Substance Use Topics  . Smoking status: Never Smoker  . Smokeless tobacco: Never Used  . Alcohol use Yes     ROS:  As above   Medications: Current Outpatient Prescriptions  Medication Sig Dispense Refill  . aspirin 81 MG tablet Take 81 mg by mouth daily.      . Cholecalciferol (VITAMIN D) 2000 UNITS tablet Take 2,000 Units by mouth.    . dapagliflozin propanediol (FARXIGA) 5 MG TABS tablet Take 5 mg by mouth daily. 30 tablet 11  . Diclofenac Sodium (PENNSAID) 2 % SOLN Place 1 application onto the skin 4 (four) times daily as needed (pain). 112 g 12  . JANUMET 50-1000 MG tablet Take 1 tablet by mouth two  times daily with meals 180 tablet 3  . lamoTRIgine  (LAMICTAL) 100 MG tablet Take 1 tablet (100 mg total) by mouth 2 (two) times daily. 60 tablet 0  . levothyroxine (SYNTHROID, LEVOTHROID) 175 MCG tablet     . lisinopril (PRINIVIL,ZESTRIL) 20 MG tablet TAKE ONE TABLET BY MOUTH ONCE DAILY 180 tablet 2  . lovastatin (MEVACOR) 20 MG tablet Take 1 tablet by mouth at  bedtime 90 tablet 2  . nystatin (MYCOSTATIN/NYSTOP) 100000 UNIT/GM POWD 1 application TID PRN. 180 g 3  . orphenadrine (NORFLEX) 100 MG tablet Take 100 mg by mouth 2 (two) times daily.    . rizatriptan (MAXALT-MLT) 10 MG disintegrating tablet Take 10 mg by mouth as needed. May repeat in 2 hours if needed     . traMADol (ULTRAM) 50 MG tablet Take 1 tablet (50 mg total) by mouth every 6 (six) hours as needed. 90 tablet 0  . Travoprost (TRAVATAN OP) Apply 1 drop to eye.     No current facility-administered medications for this visit.    Allergies  Allergen Reactions  . Glipizide Other (See Comments)    Hypoglycemia  . Phentermine Other (See Comments)    palpitations  . Topamax [Topiramate] Other (See Comments)    Mood change      Exam:  BP 123/85   Pulse (!) 103   Wt 232 lb (105.2 kg)  LMP 12/26/2011   BMI 39.82 kg/m  General: Well Developed, well nourished, and in no acute distress.  Neuro/Psych: Alert and oriented x3, extra-ocular muscles intact, able to move all 4 extremities, sensation grossly intact. Skin: Warm and dry, no rashes noted.  Respiratory: Not using accessory muscles, speaking in full sentences, trachea midline.  Cardiovascular: Pulses palpable, no extremity edema. Abdomen: Does not appear distended. MSK:  Lumbar spine: Normal appearance Tenderness to palpation over left paraspinal area Tenderness on flexion Full strength of lower extremities Straight leg raise test positive on left side   No results found for this or any previous visit (from the past 48 hour(s)). No results found.    Assessment and Plan: 55 y.o. female with a history of back  pain presents with acute-onset left posterolateral leg pain associated with lumbar paraspinal tenderness. Most likely due to disc protrusion causing nerve compression; steroid injection would likely help, but not ideal in the setting of diabetes. Also consider spinal metastases given history of papillary thyroid cancer.  - Spine x-ray - PT referral - Heat - Follow up in 2-3 weeks   No orders of the defined types were placed in this encounter.   Discussed warning signs or symptoms. Please see discharge instructions. Patient expresses understanding.  This patient was seen and interviewed and examined independently by myself. Lynne Leader, MD

## 2015-12-11 ENCOUNTER — Ambulatory Visit (INDEPENDENT_AMBULATORY_CARE_PROVIDER_SITE_OTHER): Payer: BLUE CROSS/BLUE SHIELD | Admitting: Physical Therapy

## 2015-12-11 ENCOUNTER — Encounter: Payer: Self-pay | Admitting: Physical Therapy

## 2015-12-11 DIAGNOSIS — M25652 Stiffness of left hip, not elsewhere classified: Secondary | ICD-10-CM

## 2015-12-11 DIAGNOSIS — R52 Pain, unspecified: Secondary | ICD-10-CM | POA: Diagnosis not present

## 2015-12-11 DIAGNOSIS — R2689 Other abnormalities of gait and mobility: Secondary | ICD-10-CM

## 2015-12-11 DIAGNOSIS — R29898 Other symptoms and signs involving the musculoskeletal system: Secondary | ICD-10-CM

## 2015-12-11 NOTE — Therapy (Signed)
[facility] [address] [place], [place], [address] Phone: [phone]   Fax:  [phone]  Physical Therapy Evaluation  Patient Details  Name: Martha Woods MRN: [id number] Date of Birth: December 19, 1960 Referring Provider: Gregor Hams  Encounter Date: 12/11/2015      PT End of Session - 12/11/15 1127    Visit Number 1   Number of Visits 12   Date for PT Re-Evaluation 01/22/16   PT Start Time 0932   PT Stop Time 1016   PT Time Calculation (min) 44 min   Activity Tolerance Patient tolerated treatment well   Behavior During Therapy [facility] for tasks assessed/performed      Past Medical History:  Diagnosis Date  . Abnormal Pap smear    cervical cryo  . Cancer [facility]) 02-2009   papillary thyroid cancer, Dr. Malachy Mood  . Cervical spondylosis   . Diabetes mellitus    Gestattional Diabetes, insulin dependent  . Lumbar disc herniation   . Lumbar spondylosis   . Seborrheic dermatitis     Past Surgical History:  Procedure Laterality Date  . ett     neg for ischemia -7 mets pelvic u/s normal  . plantar fascitis  01-2010   L (Dr. Wardell Honour)  . TOTAL THYROIDECTOMY  02-2009   thyroid cancer    There were no vitals filed for this visit.       Subjective Assessment - 12/11/15 0937    Subjective Pt reports a long history of ongoing back pain, especially with bending. On Sunday she noticed weakness in the Lt LE and burning pain in the foot/calf. On 12/10/15 she was having pain from the foot up to her hip. Pt is unable to recall anything that started her pain, just awoke with the pain. Doing some better today, no burning down the Lt LE. Initally had to crawl up the steps at home.    Pertinent History history of back pain.   How long can you sit comfortably? unlimited   How long can you stand comfortably? unsure   How long can you walk comfortably? unlimited   Diagnostic tests X-rays   Currently in Pain? Yes   Pain Score 7    Pain Location Buttocks   Pain Orientation Left   Pain Descriptors / Indicators Burning;Sharp   Pain Type Acute pain   Pain Onset In the past 7 days   Pain Frequency Intermittent   Aggravating Factors  getting out of the car, bending   Pain Relieving Factors lying down            [facility] PT Assessment - 12/11/15 0001      Assessment   Medical Diagnosis Acute left-sided low back pain with left-sided sciatica   Referring Provider Gregor Hams   Onset Date/Surgical Date 12/09/15   Next MD Visit 12/24/15   Prior Therapy in the distant past for back and more recently for ankle     Balance Screen   Has the patient fallen in the past 6 months No     Prior Function   Vocation Requirements works as Therapist, 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      Observation/Other Assessments   Focus on Therapeutic Outcomes (FOTO)  65% limitation     Sensation   Light Touch Appears Intact     Posture/Postural Control   Posture Comments lateral shift to right in standing, increased lumbar lordosis     AROM   Lumbar Flexion --  23 cm fingers to floor   Lumbar Extension --  <25%   Lumbar - Right Side Bend --  55 cm fingers to floor with Lt back pain   Lumbar - Left Side Bend --  55 cm fingers to floor with Lt back pain   Lumbar - Right Rotation --  <25%   Lumbar - Left Rotation --  <25% with Lt side back pain     Strength   Right Hip Flexion 4+/5   Left Hip Flexion 4/5  pain   Right Knee Flexion 4+/5   Right Knee Extension 4+/5  pain   Left Knee Flexion 4/5   Left Knee Extension 4+/5   Right Ankle Dorsiflexion 5/5   Right Ankle Plantar Flexion 5/5   Left Ankle Dorsiflexion 4+/5   Left Ankle Plantar Flexion 4+/5     Flexibility   Soft Tissue Assessment /Muscle Length --  decreased at Lt gluteal musculature     Palpation   Palpation comment tender at lt mid gluteal region, no complaints at lumbar region/paraspinals.     Transfers   Comments encouraging  logroll for transfers supine<>sit     Ambulation/Gait   Gait Comments mild antalgic patter with decreased stace time through LLE.                   [facility] PT Treatment/Exercise - 12/11/15 0001      Lumbar Exercises: Stretches   Single Knee to Chest Stretch Limitations knee to opp. shld   Lower Trunk Rotation 5 reps;10 seconds     Knee/Hip Exercises: Stretches   Piriformis Stretch Limitations bent knee fall-in stretch                PT Education - 12/11/15 1127    Education provided Yes   Education Details HEP   Person(s) Educated Patient   Methods Explanation;Demonstration;Tactile cues;Verbal cues;Handout   Comprehension Verbalized understanding;Returned demonstration             PT Long Term Goals - 12/11/15 1135      PT LONG TERM GOAL #1   Title I with advanced HEP (01/22/16)   Period Weeks   Status New     PT LONG TERM GOAL #2   Title Pt to demonstrate active trunk flexion without pain. (01/22/16)   Time 6   Period Weeks   Status New     PT LONG TERM GOAL #3   Title Pt to report a 75% or better reduction in her perceived pain. (01/22/16)   Time 6   Period Weeks   Status New     PT LONG TERM GOAL #4   Title Pt to ambulate without an antalgic pattern. (01/22/16)   Time 6   Period Weeks   Status New     PT LONG TERM GOAL #5   Title improve FOTO =/< 49% limited (01/22/16)   Time 6   Period Weeks   Status New               Plan - 12/11/15 1128    Clinical Impression Statement Pt is a 55 y.o. female who reports an inidious onset of Lt LE radiating pain starting on 12/09/15. She did drive back from [place] the previous day. Initially the pt states that she was having radicular symptoms on the Lt but not have that today. She does present with decreased flexibility and tenderness througth the Lt gluteal region which she states is her primary area of pain  today. Functionally her ambulation and posture is compensated with reports of  pain as well has her ability to perform transfers. Additionally she does have some decreased strength througth the LLE but she states that this was present prior to her recent onset of pain. Overall the pt is appropriate for ongoing PT sessions to decrease her pain and increase her overall function.     Rehab Potential Good   PT Frequency 2x / week   PT Duration 6 weeks   PT Treatment/Interventions ADLs/Self Care Home Management;Cryotherapy;Electrical Stimulation;Iontophoresis 4mg /ml Dexamethasone;Moist Heat;Traction;Ultrasound;Gait training;Functional mobility training;Therapeutic activities;Therapeutic exercise;Patient/family education;Passive range of motion;Manual techniques;Taping;Dry needling   PT Next Visit Plan Review and progress HEP, working on lateral gluteal flexibility (lt). Modalities as appropriate.    PT Home Exercise Plan progress as tolerated.   Consulted and Agree with Plan of Care Patient      Patient will benefit from skilled therapeutic intervention in order to improve the following deficits and impairments:  Abnormal gait, Decreased activity tolerance, Decreased mobility, Decreased strength, Postural dysfunction, Impaired flexibility, Pain, Decreased range of motion, Difficulty walking  Visit Diagnosis: Acute pain - Plan: PT plan of care cert/re-cert  Stiffness of left hip, not elsewhere classified - Plan: PT plan of care cert/re-cert  Other symptoms and signs involving the musculoskeletal system - Plan: PT plan of care cert/re-cert  Other abnormalities of gait and mobility - Plan: PT plan of care cert/re-cert     Problem List Patient Active Problem List   Diagnosis Date Noted  . Lumbago with sciatica, left side 12/10/2015  . Glaucoma 10/24/2015  . Leg length inequality 01/31/2015  . Posterior tibial tendinitis of right lower extremity 01/23/2015  . Thalassemia 11/10/2013  . Migraine headache 08/10/2013  . ASC-cannot exclude HGSIL on Pap 12/24/2011  .  Hypothyroid 02/20/2011  . CARCINOMA, THYROID GLAND, PAPILLARY 12/15/2008  . UNSPECIFIED ANEMIA 09/21/2008  . SHOULDER PAIN, RIGHT 12/15/2007  . ARTHRITIS, CERVICAL SPINE 03/30/2006  . Diabetes ([place]) 10/21/2005  . Hyperlipidemia 10/21/2005  . OBESITY, NOS 10/21/2005  . HYPERTENSION, BENIGN SYSTEMIC 10/21/2005    Linard Millers, PT,CSCS 12/11/2015, 11:43 AM  [facility] [place], [place], [address] Phone: [phone]   Fax:  [phone]  Name: Esparanza Vanhout MRN: [id number] Date of Birth: 12-09-1960

## 2015-12-14 ENCOUNTER — Encounter: Payer: Self-pay | Admitting: Rehabilitative and Restorative Service Providers"

## 2015-12-14 ENCOUNTER — Ambulatory Visit (INDEPENDENT_AMBULATORY_CARE_PROVIDER_SITE_OTHER): Payer: BLUE CROSS/BLUE SHIELD | Admitting: Rehabilitative and Restorative Service Providers"

## 2015-12-14 DIAGNOSIS — R29898 Other symptoms and signs involving the musculoskeletal system: Secondary | ICD-10-CM

## 2015-12-14 DIAGNOSIS — R52 Pain, unspecified: Secondary | ICD-10-CM | POA: Diagnosis not present

## 2015-12-14 DIAGNOSIS — R2689 Other abnormalities of gait and mobility: Secondary | ICD-10-CM | POA: Diagnosis not present

## 2015-12-14 DIAGNOSIS — M25652 Stiffness of left hip, not elsewhere classified: Secondary | ICD-10-CM | POA: Diagnosis not present

## 2015-12-14 NOTE — Therapy (Signed)
[facility] [place], [place], [address] Phone: [phone]   Fax:  [phone]  Physical Therapy Treatment  Patient Details  Name: Gelina Henninger MRN: [id number] Date of Birth: [birth year] Referring Provider: Gregor Hams  Encounter Date: 12/14/2015      PT End of Session - 12/14/15 0934    Visit Number 2   Number of Visits 12   Date for PT Re-Evaluation 01/22/16   PT Start Time 0934   PT Stop Time 1025   PT Time Calculation (min) 51 min   Activity Tolerance Patient tolerated treatment well      Past Medical History:  Diagnosis Date  . Abnormal Pap smear    cervical cryo  . Cancer [facility]) 02-2009   papillary thyroid cancer, Dr. Malachy Mood  . Cervical spondylosis   . Diabetes mellitus    Gestattional Diabetes, insulin dependent  . Lumbar disc herniation   . Lumbar spondylosis   . Seborrheic dermatitis     Past Surgical History:  Procedure Laterality Date  . ett     neg for ischemia -7 mets pelvic u/s normal  . plantar fascitis  01-2010   L (Dr. Wardell Honour)  . TOTAL THYROIDECTOMY  02-2009   thyroid cancer    There were no vitals filed for this visit.      Subjective Assessment - 12/14/15 0937    Subjective Patient reports that she could feel some pain with trunk rotation but the other exercises were OK - could feel some pull and discomfort with the piriformis stretch. Symptoms are worse when she sits down and gets back up    Currently in Pain? Yes   Pain Score 6    Pain Location Buttocks   Pain Orientation Left   Pain Descriptors / Indicators Burning;Patsi Sears Adult PT Treatment/Exercise - 12/14/15 0001      Posture/Postural Control   Posture Comments bilat knees in valgus position      Lumbar Exercises: Stretches   Single Knee to Chest Stretch Limitations knee to opp. shld   Lower Trunk Rotation 5 reps;10 seconds  modified - pt to  avoid pain by limiting rotation      Lumbar Exercises: Supine   Other Supine Lumbar Exercises 3 part core 10 sec x 10   reqiuired verbal and tactile cues      Knee/Hip Exercises: Stretches   Piriformis Stretch 3 reps;30 seconds  travell w/ strap     Knee/Hip Exercises: Supine   Other Supine Knee/Hip Exercises marching with core engaged x 10      Moist Heat Therapy   Number Minutes Moist Heat 15 Minutes   Moist Heat Location Hip;Lumbar Spine  Lt     Electrical Stimulation   Electrical Stimulation Location Lt piriformis/gluts   Electrical Stimulation Action IFC   Electrical Stimulation Parameters to tolerance   Electrical Stimulation Goals Tone;Pain     Manual Therapy   Manual therapy comments pt prone   Soft tissue mobilization Lt hip - piriformis/gluts   Myofascial Release Lt posterior hip                 PT Education - 12/14/15 0954    Education provided Yes   Education Details HEP    Person(s) Educated Patient   Methods Explanation;Demonstration;Tactile cues;Verbal cues;Handout   Comprehension Verbalized understanding;Returned demonstration;Verbal  cues required;Tactile cues required             PT Long Term Goals - 12/11/15 1135      PT LONG TERM GOAL #1   Title I with advanced HEP (01/22/16)   Period Weeks   Status New     PT LONG TERM GOAL #2   Title Pt to demonstrate active trunk flexion without pain. (01/22/16)   Time 6   Period Weeks   Status New     PT LONG TERM GOAL #3   Title Pt to report a 75% or better reduction in her perceived pain. (01/22/16)   Time 6   Period Weeks   Status New     PT LONG TERM GOAL #4   Title Pt to ambulate without an antalgic pattern. (01/22/16)   Time 6   Period Weeks   Status New     PT LONG TERM GOAL #5   Title improve FOTO =/< 49% limited (01/22/16)   Time 6   Period Weeks   Status New               Plan - 12/14/15 1014    Clinical Impression Statement Persistent LB/Lt hip pain. Working on  exercises. Suggested patient try her TENS unit which she has used for other musculoskeletal problems. Tolerated exercise without difficulty.    Rehab Potential Good   PT Frequency 2x / week   PT Duration 6 weeks   PT Treatment/Interventions ADLs/Self Care Home Management;Cryotherapy;Electrical Stimulation;Iontophoresis 4mg /ml Dexamethasone;Moist Heat;Traction;Ultrasound;Gait training;Functional mobility training;Therapeutic activities;Therapeutic exercise;Patient/family education;Passive range of motion;Manual techniques;Taping;Dry needling   PT Next Visit Plan Review and progress HEP, working on lateral gluteal flexibility (lt). Modalities as appropriate. Manual work and TDN as indicated   Oncologist with Plan of Care Patient      Patient will benefit from skilled therapeutic intervention in order to improve the following deficits and impairments:  Abnormal gait, Decreased activity tolerance, Decreased mobility, Decreased strength, Postural dysfunction, Impaired flexibility, Pain, Decreased range of motion, Difficulty walking  Visit Diagnosis: Acute pain  Stiffness of left hip, not elsewhere classified  Other symptoms and signs involving the musculoskeletal system  Other abnormalities of gait and mobility     Problem List Patient Active Problem List   Diagnosis Date Noted  . Lumbago with sciatica, left side 12/10/2015  . Glaucoma 10/24/2015  . Leg length inequality 01/31/2015  . Posterior tibial tendinitis of right lower extremity 01/23/2015  . Thalassemia 11/10/2013  . Migraine headache 08/10/2013  . ASC-cannot exclude HGSIL on Pap 12/24/2011  . Hypothyroid 02/20/2011  . CARCINOMA, THYROID GLAND, PAPILLARY 12/15/2008  . UNSPECIFIED ANEMIA 09/21/2008  . SHOULDER PAIN, RIGHT 12/15/2007  . ARTHRITIS, CERVICAL SPINE 03/30/2006  . Diabetes ([place]) 10/21/2005  . Hyperlipidemia 10/21/2005  . OBESITY, NOS 10/21/2005  . HYPERTENSION, BENIGN SYSTEMIC 10/21/2005    Destiny Trickey Nilda Simmer PT, MPH  12/14/2015, 10:17 AM  [facility] [place], [place], [address] Phone: [phone]   Fax:  [phone]  Name: Loucile Niebur MRN: [id number] Date of Birth: 19-Dec-1960

## 2015-12-14 NOTE — Patient Instructions (Addendum)
Abdominal Bracing With Pelvic Floor (Hook-Lying)    With neutral spine, tighten pelvic floor and abdominals sucking belly button to back bone; tighten muscles in low back at waist. Hold 10 sec  Repeat _10__ times. Do _several__ times a day. Progress to do this in sitting; standing; walking and with functional activities   lBent Leg Lift (Hook-Lying)    Tighten stomach and slowly raise right leg ___10-12_ inches from floor. Keep trunk rigid. Hold __2-3__ seconds. Repeat _10___ times per set. Do _1-3___ sets per session. Do __1-2__ sessions per day.  Piriformis Stretch    Lying on back, pull right knee toward opposite shoulder. Hold _30__ seconds. Repeat __3__ times. Do __2-3_ sessions per day.

## 2015-12-18 ENCOUNTER — Ambulatory Visit (INDEPENDENT_AMBULATORY_CARE_PROVIDER_SITE_OTHER): Payer: BLUE CROSS/BLUE SHIELD | Admitting: Physical Therapy

## 2015-12-18 DIAGNOSIS — R29898 Other symptoms and signs involving the musculoskeletal system: Secondary | ICD-10-CM | POA: Diagnosis not present

## 2015-12-18 DIAGNOSIS — R52 Pain, unspecified: Secondary | ICD-10-CM

## 2015-12-18 DIAGNOSIS — M25652 Stiffness of left hip, not elsewhere classified: Secondary | ICD-10-CM

## 2015-12-18 NOTE — Therapy (Signed)
[facility] [place], [place], [address] Phone: [phone]   Fax:  [phone]  Physical Therapy Treatment  Patient Details  Name: Martha Woods MRN: [id number] Date of Birth: [birth year] Referring Provider: Dr. Georgina Snell  Encounter Date: 12/18/2015      PT End of Session - 12/18/15 0808    Visit Number 3   Number of Visits 12   Date for PT Re-Evaluation 01/22/16   PT Start Time 0805   PT Stop Time 0850   PT Time Calculation (min) 45 min      Past Medical History:  Diagnosis Date  . Abnormal Pap smear    cervical cryo  . Cancer [facility]) 02-2009   papillary thyroid cancer, Dr. Malachy Mood  . Cervical spondylosis   . Diabetes mellitus    Gestattional Diabetes, insulin dependent  . Lumbar disc herniation   . Lumbar spondylosis   . Seborrheic dermatitis     Past Surgical History:  Procedure Laterality Date  . ett     neg for ischemia -7 mets pelvic u/s normal  . plantar fascitis  01-2010   L (Dr. Wardell Honour)  . TOTAL THYROIDECTOMY  02-2009   thyroid cancer    There were no vitals filed for this visit.      Subjective Assessment - 12/18/15 0808    Subjective Pt states she cannot find her TENS unit. 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  She reports the overall pain is 30% less than with initial occurance.     Currently in Pain? Yes   Pain Score 6    Pain Location Buttocks   Pain Orientation Left   Pain Descriptors / Indicators Burning;Sharp   Aggravating Factors  getting out of car, bending    Pain Relieving Factors lying down, OTC meds.             [facility] PT Assessment - 12/18/15 0001      Assessment   Medical Diagnosis Acute left-sided low back pain with left-sided sciatica   Referring Provider Dr. Georgina Snell   Onset Date/Surgical Date 12/09/15   Hand Dominance Right   Next MD Visit 12/24/15          [facility] Adult  PT Treatment/Exercise - 12/18/15 0001      Lumbar Exercises: Stretches   Single Knee to Chest Stretch Limitations knee to opp. shld   Lower Trunk Rotation 5 reps;10 seconds  modified - pt to avoid pain by limiting rotation      Lumbar Exercises: Aerobic   Stationary Bike NuStep L4: 5 min      Lumbar Exercises: Supine   Ab Set 10 reps;5 seconds  (TA contraction)     Knee/Hip Exercises: Stretches   Piriformis Stretch 3 reps;30 seconds;Right;Left     Moist Heat Therapy   Number Minutes Moist Heat 15 Minutes   Moist Heat Location Hip;Lumbar Spine  Lt     Electrical Stimulation   Electrical Stimulation Location Lt piriformis/glutes   Electrical Stimulation Action IFC   Electrical Stimulation Parameters to tolerance    Electrical Stimulation Goals Pain;Tone     Manual Therapy   Manual therapy comments Pt in Rt sidelying, then PRone   Soft tissue mobilization TPR to various points in Lt glute/ piriformis., Pin and stretch to Lt hip rotators with passive ER/IR of LE.    Myofascial Release Lt posterior hip.  PT Education - 12/18/15 [phone]    Education provided Yes   Education Details self massage to Lt hip with ball.    Person(s) Educated Patient   Methods Explanation;Demonstration   Comprehension Verbal cues required             PT Long Term Goals - 12/18/15 0816      PT LONG TERM GOAL #1   Title I with advanced HEP (01/22/16)   Time 6   Period Weeks   Status On-going     PT LONG TERM GOAL #2   Title Pt to demonstrate active trunk flexion without pain. (01/22/16)   Time 6   Period Weeks   Status On-going     PT LONG TERM GOAL #3   Title Pt to report a 75% or better reduction in her perceived pain. (01/22/16)   Time 6   Period Weeks   Status On-going     PT LONG TERM GOAL #4   Title Pt to ambulate without an antalgic pattern. (01/22/16)   Time 6   Period Weeks   Status On-going     PT LONG TERM GOAL #5   Title improve FOTO =/< 49%  limited (01/22/16)   Time 6   Period Weeks   Status On-going               Plan - 12/18/15 0817    Clinical Impression Statement Pt had pain initially in Lt glute with stretches, but pain subsided with subsequent stretches.  She was point tender with manual therapy to Lt glute/piriformis/ ITB; reported slight reduction after manual therapy.  Further reduction with use of estim/MHP.  Pt making gradual progress towards established goals.    Rehab Potential Good   PT Frequency 2x / week   PT Duration 6 weeks   PT Treatment/Interventions ADLs/Self Care Home Management;Cryotherapy;Electrical Stimulation;Iontophoresis 4mg /ml Dexamethasone;Moist Heat;Traction;Ultrasound;Gait training;Functional mobility training;Therapeutic activities;Therapeutic exercise;Patient/family education;Passive range of motion;Manual techniques;Taping;Dry needling   PT Next Visit Plan Review and progress HEP, working on lateral gluteal flexibility (lt). Modalities as appropriate. Manual work and TDN as indicated   Oncologist with Plan of Care Patient      Patient will benefit from skilled therapeutic intervention in order to improve the following deficits and impairments:  Abnormal gait, Decreased activity tolerance, Decreased mobility, Decreased strength, Postural dysfunction, Impaired flexibility, Pain, Decreased range of motion, Difficulty walking  Visit Diagnosis: Acute pain  Stiffness of left hip, not elsewhere classified  Other symptoms and signs involving the musculoskeletal system     Problem List Patient Active Problem List   Diagnosis Date Noted  . Lumbago with sciatica, left side 12/10/2015  . Glaucoma 10/24/2015  . Leg length inequality 01/31/2015  . Posterior tibial tendinitis of right lower extremity 01/23/2015  . Thalassemia 11/10/2013  . Migraine headache 08/10/2013  . ASC-cannot exclude HGSIL on Pap 12/24/2011  . Hypothyroid 02/20/2011  . CARCINOMA, THYROID GLAND, PAPILLARY  12/15/2008  . UNSPECIFIED ANEMIA 09/21/2008  . SHOULDER PAIN, RIGHT 12/15/2007  . ARTHRITIS, CERVICAL SPINE 03/30/2006  . Diabetes ([place]) 10/21/2005  . Hyperlipidemia 10/21/2005  . OBESITY, NOS 10/21/2005  . HYPERTENSION, BENIGN SYSTEMIC 10/21/2005   Kerin Perna, PTA 12/18/15 1:16 PM  [facility] [place], [place], [address] Phone: [phone]   Fax:  [phone]  Name: Fate Mitten MRN: [id number] Date of Birth: [birth year]

## 2015-12-20 ENCOUNTER — Ambulatory Visit (INDEPENDENT_AMBULATORY_CARE_PROVIDER_SITE_OTHER): Payer: BLUE CROSS/BLUE SHIELD | Admitting: Physical Therapy

## 2015-12-20 ENCOUNTER — Encounter: Payer: Self-pay | Admitting: Physical Therapy

## 2015-12-20 DIAGNOSIS — R29898 Other symptoms and signs involving the musculoskeletal system: Secondary | ICD-10-CM | POA: Diagnosis not present

## 2015-12-20 DIAGNOSIS — R2689 Other abnormalities of gait and mobility: Secondary | ICD-10-CM

## 2015-12-20 DIAGNOSIS — M25652 Stiffness of left hip, not elsewhere classified: Secondary | ICD-10-CM

## 2015-12-20 DIAGNOSIS — R52 Pain, unspecified: Secondary | ICD-10-CM

## 2015-12-20 NOTE — Therapy (Addendum)
[facility] [address] [place], [place], [address] Phone: [phone]   Fax:  [phone]  Physical Therapy Treatment  Patient Details  Name: Martha Woods MRN: [id number] Date of Birth: 12/04/1960 Referring Provider: Dr. Corey  Encounter Date: 12/20/2015      PT End of Session - 12/20/15 1225    Visit Number 4   Number of Visits 12   Date for PT Re-Evaluation 01/22/16   PT Start Time 0804   PT Stop Time 0848   PT Time Calculation (min) 44 min   Activity Tolerance Patient tolerated treatment well   Behavior During Therapy WFL for tasks assessed/performed      Past Medical History:  Diagnosis Date  . Abnormal Pap smear    cervical cryo  . Cancer ([facility]) 02-2009   papillary thyroid cancer, Dr. Tracie Farmer  . Cervical spondylosis   . Diabetes mellitus    Gestattional Diabetes, insulin dependent  . Lumbar disc herniation   . Lumbar spondylosis   . Seborrheic dermatitis     Past Surgical History:  Procedure Laterality Date  . ett     neg for ischemia -7 mets pelvic u/s normal  . plantar fascitis  01-2010   L (Dr. Sprinkle)  . TOTAL THYROIDECTOMY  02-2009   thyroid cancer    There were no vitals filed for this visit.      Subjective Assessment - 12/20/15 0807    Subjective (P)  Following the last session she had to drive an hour and the pain got really bad. Pain is still on the left side.    Currently in Pain? (P)  Yes   Pain Score (P)  5    Pain Location (P)  Buttocks   Pain Orientation (P)  Left   Pain Descriptors / Indicators (P)  Burning;Aching   Aggravating Factors  (P)  sitting    Pain Relieving Factors (P)  heat                         [facility] Adult PT Treatment/Exercise - 12/20/15 0001      Posture/Postural Control   Posture Comments leg length discrepancey, pt reports having orthotic for adjustment     Self-Care   Self-Care Other Self-Care Comments   Other  Self-Care Comments  reviewed sitting posture, pt with increased LBP with posterior tils and decreased with neutral to slight anterior tilt. Educated on car seat position and lumbar support.      Lumbar Exercises: Stretches   Single Knee to Chest Stretch Limitations knee to opp. shld     Lumbar Exercises: Standing   Other Standing Lumbar Exercises marching- poor abductor strength      Lumbar Exercises: Supine   Ab Set 10 reps;5 seconds  (TA contraction)     Lumbar Exercises: Prone   Other Prone Lumbar Exercises prone>prone on elbows- reports increased pain following 2 reps     Knee/Hip Exercises: Stretches   Piriformis Stretch 3 reps;30 seconds;Right;Left     Knee/Hip Exercises: Supine   Other Supine Knee/Hip Exercises hooklying hip abd with grade 1 band.      Moist Heat Therapy   Number Minutes Moist Heat 15 Minutes   Moist Heat Location Lumbar Spine;Hip     Manual Therapy   Manual Therapy Soft tissue mobilization   Manual therapy comments in Rt sidelying   Soft tissue mobilization TPR to Lt gluteal region                  PT Education - 12/20/15 1225    Education provided Yes   Education Details modified sidelying calms to hooklying with band   Person(s) Educated Patient   Methods Explanation;Demonstration;Tactile cues;Verbal cues   Comprehension Verbalized understanding;Returned demonstration             PT Long Term Goals - 12/18/15 0816      PT LONG TERM GOAL #1   Title I with advanced HEP (01/22/16)   Time 6   Period Weeks   Status On-going     PT LONG TERM GOAL #2   Title Pt to demonstrate active trunk flexion without pain. (01/22/16)   Time 6   Period Weeks   Status On-going     PT LONG TERM GOAL #3   Title Pt to report a 75% or better reduction in her perceived pain. (01/22/16)   Time 6   Period Weeks   Status On-going     PT LONG TERM GOAL #4   Title Pt to ambulate without an antalgic pattern. (01/22/16)   Time 6   Period Weeks   Status  On-going     PT LONG TERM GOAL #5   Title improve FOTO =/< 49% limited (01/22/16)   Time 6   Period Weeks   Status On-going               Plan - 12/20/15 1228    Clinical Impression Statement Pt reports modest improvement but continued pain in the Lt gluteal region. Pt demo. poor hip abductor strength with significant trendelenburg in SLS. Decreased pain when sitting with neutral spine position and increased pain with increased lordosis. Will continue with OPPT services to work on flexibility, strength and decrease pain.     Rehab Potential Good   PT Frequency 2x / week   PT Duration 6 weeks   PT Treatment/Interventions ADLs/Self Care Home Management;Cryotherapy;Electrical Stimulation;Iontophoresis 59m/ml Dexamethasone;Moist Heat;Traction;Ultrasound;Gait training;Functional mobility training;Therapeutic activities;Therapeutic exercise;Patient/family education;Passive range of motion;Manual techniques;Taping;Dry needling   PT Next Visit Plan cont with hip abd strengthening/flexibility. Modalities/manual therapy as needed.    Consulted and Agree with Plan of Care Patient      Patient will benefit from skilled therapeutic intervention in order to improve the following deficits and impairments:  Abnormal gait, Decreased activity tolerance, Decreased mobility, Decreased strength, Postural dysfunction, Impaired flexibility, Pain, Decreased range of motion, Difficulty walking  Visit Diagnosis: Acute pain  Stiffness of left hip, not elsewhere classified  Other symptoms and signs involving the musculoskeletal system  Other abnormalities of gait and mobility     Problem List Patient Active Problem List   Diagnosis Date Noted  . Lumbago with sciatica, left side 12/10/2015  . Glaucoma 10/24/2015  . Leg length inequality 01/31/2015  . Posterior tibial tendinitis of right lower extremity 01/23/2015  . Thalassemia 11/10/2013  . Migraine headache 08/10/2013  . ASC-cannot exclude  HGSIL on Pap 12/24/2011  . Hypothyroid 02/20/2011  . CARCINOMA, THYROID GLAND, PAPILLARY 12/15/2008  . UNSPECIFIED ANEMIA 09/21/2008  . SHOULDER PAIN, RIGHT 12/15/2007  . ARTHRITIS, CERVICAL SPINE 03/30/2006  . Diabetes [place] 10/21/2005  . Hyperlipidemia 10/21/2005  . OBESITY, NOS 10/21/2005  . HYPERTENSION, BENIGN SYSTEMIC 10/21/2005    BLinard Millers PT, CSCS 12/20/2015, 12:33 PM  [facility] [place] [address]Phone: [phone]  Fax:  [phone] Name: Martha BagentMRN: [id number] of Birth: [birth year] PHYSICAL THERAPY DISCHARGE SUMMARY  Visits from Start of Care: 4  Current  functional level related to goals / functional outcomes: See progress note for discharge status.    Remaining deficits: Continued symptoms. Returning to MD for further evaluation.   Education / Equipment: HEP Plan: Patient agrees to discharge.  Patient goals were partially met. Patient is being discharged due to not returning since the last visit.  ?????    Celyn P. Helene Kelp PT, MPH 01/17/16 2:36 PM

## 2015-12-24 ENCOUNTER — Ambulatory Visit (INDEPENDENT_AMBULATORY_CARE_PROVIDER_SITE_OTHER): Payer: BLUE CROSS/BLUE SHIELD | Admitting: Family Medicine

## 2015-12-24 ENCOUNTER — Encounter: Payer: Self-pay | Admitting: Family Medicine

## 2015-12-24 VITALS — BP 117/82 | HR 84 | Wt 233.0 lb

## 2015-12-24 DIAGNOSIS — M5442 Lumbago with sciatica, left side: Secondary | ICD-10-CM | POA: Diagnosis not present

## 2015-12-24 NOTE — Progress Notes (Signed)
Martha Comiskey is a 55 y.o. female who presents to [facility] today for follow up back and leg pain.  She still has lower back pain radiating to the back of her thigh and left posterior calf, despite multiple PT sessions. Worse with sitting. Heat has only helped temporarily. Continues to deny numbness, bowel or bladder incontinence, or saddle anesthesia.   Past Medical History:  Diagnosis Date  . Abnormal Pap smear    cervical cryo  . Cancer [facility]) 02-2009   papillary thyroid cancer, Dr. Malachy Mood  . Cervical spondylosis   . Diabetes mellitus    Gestattional Diabetes, insulin dependent  . Lumbar disc herniation   . Lumbar spondylosis   . Seborrheic dermatitis    Past Surgical History:  Procedure Laterality Date  . ett     neg for ischemia -7 mets pelvic u/s normal  . plantar fascitis  01-2010   L (Dr. Wardell Honour)  . TOTAL THYROIDECTOMY  02-2009   thyroid cancer   Social History  Substance Use Topics  . Smoking status: Never Smoker  . Smokeless tobacco: Never Used  . Alcohol use Yes     ROS:  As above   Medications: Current Outpatient Prescriptions  Medication Sig Dispense Refill  . aspirin 81 MG tablet Take 81 mg by mouth daily.      . Cholecalciferol (VITAMIN D) 2000 UNITS tablet Take 2,000 Units by mouth.    . dapagliflozin propanediol (FARXIGA) 5 MG TABS tablet Take 5 mg by mouth daily. 30 tablet 11  . Diclofenac Sodium (PENNSAID) 2 % SOLN Place 1 application onto the skin 4 (four) times daily as needed (pain). 112 g 12  . JANUMET 50-1000 MG tablet Take 1 tablet by mouth two  times daily with meals 180 tablet 3  . lamoTRIgine (LAMICTAL) 100 MG tablet Take 1 tablet (100 mg total) by mouth 2 (two) times daily. 60 tablet 0  . levothyroxine (SYNTHROID, LEVOTHROID) 175 MCG tablet     . lisinopril (PRINIVIL,ZESTRIL) 20 MG tablet TAKE ONE TABLET BY MOUTH ONCE DAILY 180 tablet 2  . lovastatin (MEVACOR) 20 MG tablet  Take 1 tablet by mouth at  bedtime 90 tablet 2  . nystatin (MYCOSTATIN/NYSTOP) 100000 UNIT/GM POWD 1 application TID PRN. 180 g 3  . orphenadrine (NORFLEX) 100 MG tablet Take 100 mg by mouth 2 (two) times daily.    . rizatriptan (MAXALT-MLT) 10 MG disintegrating tablet Take 10 mg by mouth as needed. May repeat in 2 hours if needed     . traMADol (ULTRAM) 50 MG tablet Take 1 tablet (50 mg total) by mouth every 6 (six) hours as needed. 90 tablet 0  . Travoprost (TRAVATAN OP) Apply 1 drop to eye.     No current facility-administered medications for this visit.    Allergies  Allergen Reactions  . Glipizide Other (See Comments)    Hypoglycemia  . Phentermine Other (See Comments)    palpitations  . Topamax [Topiramate] Other (See Comments)    Mood change      Exam:  BP 117/82   Pulse 84   Wt 233 lb (105.7 kg)   LMP 12/26/2011   BMI 39.99 kg/m  General: Well Developed, well nourished, and in no acute distress.  Neuro/Psych: Alert and oriented x3, extra-ocular muscles intact, able to move all 4 extremities, sensation grossly intact. Skin: Warm and dry, no rashes noted.  Respiratory: Not using accessory muscles, speaking in full sentences, trachea midline.  Cardiovascular:  Pulses palpable, no extremity edema. Abdomen: Does not appear distended. MSK: Lumbar spine: Normal appearance Tenderness to palpation over lumbar midline Tenderness on flexion Full strength of lower extremities  No results found for this or any previous visit (from the past 48 hour(s)). No results found.  Lumbar spine x-ray 12/10/15: IMPRESSION: Moderate severe degenerative disc space narrowing at L3-4. Multilevel spondylosis. No compression fracture or spondylolisthesis. The disc space narrowing at L3-4 has progressed since the CT scan of September 2010.   Assessment and Plan: 55 y.o. female with acute lower back pain with left-sided sciatica. X-ray shows degenerative disease at L3-4 that likely  accounts for her back pain but is inconsistent with the dermatomal distribution of her leg pain. Suspect leg pain is due to disc protrusion and subsequent radiculopathy that will benefit from epidural steroid injection. Get MRI; follow up after to discuss results.   Orders Placed This Encounter  Procedures  . MR Lumbar Spine Wo Contrast    Standing Status:   Future    Standing Expiration Date:   02/23/2017    Order Specific Question:   Reason for Exam (SYMPTOM  OR DIAGNOSIS REQUIRED)    Answer:   eval left lumbar radiculaopathy failed conservative managment    Order Specific Question:   Preferred imaging location?    Answer:   Product/process development scientist (table limit-350lbs)    Order Specific Question:   What is the patient's sedation requirement?    Answer:   No Sedation    Order Specific Question:   Does the patient have a pacemaker or implanted devices?    Answer:   No    Discussed warning signs or symptoms. Please see discharge instructions. Patient expresses understanding.

## 2015-12-24 NOTE — Patient Instructions (Signed)
Thank you for coming in today. You should hear from MRI soon.  Get the MRI and follow up with me a few days after the MRI to go over results.   Come back or go to the emergency room if you notice new weakness new numbness problems walking or bowel or bladder problems.

## 2015-12-31 ENCOUNTER — Ambulatory Visit (INDEPENDENT_AMBULATORY_CARE_PROVIDER_SITE_OTHER): Payer: BLUE CROSS/BLUE SHIELD

## 2015-12-31 ENCOUNTER — Other Ambulatory Visit: Payer: Self-pay | Admitting: Family Medicine

## 2015-12-31 DIAGNOSIS — M5442 Lumbago with sciatica, left side: Secondary | ICD-10-CM

## 2015-12-31 DIAGNOSIS — M48061 Spinal stenosis, lumbar region without neurogenic claudication: Secondary | ICD-10-CM | POA: Diagnosis not present

## 2015-12-31 DIAGNOSIS — M5116 Intervertebral disc disorders with radiculopathy, lumbar region: Secondary | ICD-10-CM | POA: Diagnosis not present

## 2015-12-31 DIAGNOSIS — M129 Arthropathy, unspecified: Secondary | ICD-10-CM | POA: Diagnosis not present

## 2015-12-31 DIAGNOSIS — M479 Spondylosis, unspecified: Secondary | ICD-10-CM | POA: Diagnosis not present

## 2016-01-01 ENCOUNTER — Telehealth: Payer: Self-pay | Admitting: Family Medicine

## 2016-01-01 DIAGNOSIS — M5442 Lumbago with sciatica, left side: Secondary | ICD-10-CM

## 2016-01-01 NOTE — Telephone Encounter (Signed)
MRI L-spine shows diffuse arthritis with narrowing and possible pinched nerve. Epidural steroid injection as ordered.

## 2016-01-04 NOTE — Telephone Encounter (Signed)
Left vm with [facility] to schedule pt. Called to notify pt. No answer, unable to leave vm.

## 2016-01-23 ENCOUNTER — Ambulatory Visit (INDEPENDENT_AMBULATORY_CARE_PROVIDER_SITE_OTHER): Payer: BLUE CROSS/BLUE SHIELD | Admitting: Family Medicine

## 2016-01-23 ENCOUNTER — Encounter: Payer: Self-pay | Admitting: Family Medicine

## 2016-01-23 VITALS — BP 110/73 | HR 88 | Wt 234.0 lb

## 2016-01-23 DIAGNOSIS — M76821 Posterior tibial tendinitis, right leg: Secondary | ICD-10-CM | POA: Diagnosis not present

## 2016-01-23 DIAGNOSIS — M47817 Spondylosis without myelopathy or radiculopathy, lumbosacral region: Secondary | ICD-10-CM

## 2016-01-23 DIAGNOSIS — M4697 Unspecified inflammatory spondylopathy, lumbosacral region: Secondary | ICD-10-CM

## 2016-01-23 NOTE — Patient Instructions (Signed)
Thank you for coming in today. You should hear about the injection soon.  Let me know if you do not hear anything.  Take it easy with your foot and ankle.  Continue the exercises.  Return in 2-4 weeks for recheck.  Return after injections.   Call or go to the ER if you develop a large red swollen joint with extreme pain or oozing puss.

## 2016-01-23 NOTE — Progress Notes (Signed)
Called [facility] and left VM to schedule pt for injection. This is the second attempt to get pt scheduled.

## 2016-01-23 NOTE — Progress Notes (Signed)
Martha Woods is a 56 y.o. female who presents to [facility] today for follow up low back pain.   Since the last visit, she got an MRI showing facet arthropathy and mild foraminal stenosis. Her lower back pain has gotten worse and now hurts with standing long periods of time as well as sitting. However, the radiating pain down the left side of her leg to her ankle has resolved. She has previously tried 5-6 sessions of PT for her lower back pain radiating down her leg, which she felt were unhelpful.  She has noticed new pain on the medial aspect of her right ankle recently, which she attributes to putting more weight on her right leg to compensate for her lower and left sided back pain.   Past Medical History:  Diagnosis Date  . Abnormal Pap smear    cervical cryo  . Cancer [facility]) 02-2009   papillary thyroid cancer, Dr. Malachy Mood  . Cervical spondylosis   . Diabetes mellitus    Gestattional Diabetes, insulin dependent  . Lumbar disc herniation   . Lumbar spondylosis   . Seborrheic dermatitis    Past Surgical History:  Procedure Laterality Date  . ett     neg for ischemia -7 mets pelvic u/s normal  . plantar fascitis  01-2010   L (Dr. Wardell Honour)  . TOTAL THYROIDECTOMY  02-2009   thyroid cancer   Social History  Substance Use Topics  . Smoking status: Never Smoker  . Smokeless tobacco: Never Used  . Alcohol use Yes     ROS:  As above   Medications: Current Outpatient Prescriptions  Medication Sig Dispense Refill  . aspirin 81 MG tablet Take 81 mg by mouth daily.      . Cholecalciferol (VITAMIN D) 2000 UNITS tablet Take 2,000 Units by mouth.    . Diclofenac Sodium (PENNSAID) 2 % SOLN Place 1 application onto the skin 4 (four) times daily as needed (pain). 112 g 12  . FARXIGA 5 MG TABS tablet TAKE 1 TABLET BY MOUTH EVERY DAY 30 tablet 1  . JANUMET 50-1000 MG tablet Take 1 tablet by mouth two  times daily with meals  180 tablet 3  . lamoTRIgine (LAMICTAL) 100 MG tablet Take 1 tablet (100 mg total) by mouth 2 (two) times daily. 60 tablet 0  . levothyroxine (SYNTHROID, LEVOTHROID) 175 MCG tablet     . lisinopril (PRINIVIL,ZESTRIL) 20 MG tablet TAKE ONE TABLET BY MOUTH ONCE DAILY 180 tablet 2  . lovastatin (MEVACOR) 20 MG tablet Take 1 tablet by mouth at  bedtime 90 tablet 2  . nystatin (MYCOSTATIN/NYSTOP) 100000 UNIT/GM POWD 1 application TID PRN. 180 g 3  . orphenadrine (NORFLEX) 100 MG tablet Take 100 mg by mouth 2 (two) times daily.    . rizatriptan (MAXALT-MLT) 10 MG disintegrating tablet Take 10 mg by mouth as needed. May repeat in 2 hours if needed     . traMADol (ULTRAM) 50 MG tablet Take 1 tablet (50 mg total) by mouth every 6 (six) hours as needed. 90 tablet 0  . Travoprost (TRAVATAN OP) Apply 1 drop to eye.     No current facility-administered medications for this visit.    Allergies  Allergen Reactions  . Glipizide Other (See Comments)    Hypoglycemia  . Phentermine Other (See Comments)    palpitations  . Topamax [Topiramate] Other (See Comments)    Mood change      Exam:  BP 110/73  Pulse 88   Wt 234 lb (106.1 kg)   LMP 12/26/2011   BMI 40.17 kg/m  General: Well Developed, well nourished, and in no acute distress.  Neuro/Psych: Alert and oriented x3, extra-ocular muscles intact, able to move all 4 extremities, sensation grossly intact. Skin: Warm and dry, no rashes noted.  Respiratory: Not using accessory muscles, speaking in full sentences, trachea midline.  Cardiovascular: Pulses palpable, no extremity edema. Abdomen: Does not appear distended. MSK: Lower back: Tender to palpation around lumbosacral midline and left posterior gluteal area Pain on flexion and extension Right ankle: Tender to palpation along posterior tibialis tendon Pain on abduction, adduction, and inversion  MR lumbar spine 12/31/15: L3-L4: Broad-based disc bulge. Moderate bilateral facet  arthropathy. Bilateral lateral recess stenosis and mild spinal stenosis. Mild left foraminal stenosis.  L4-L5: Mild broad-based disc bulge eccentric towards the right. Moderate bilateral facet arthropathy. Bilateral lateral recess stenosis and moderate spinal stenosis. No evidence of neural foraminal stenosis.  IMPRESSION: 1. Lumbar spine spondylosis as described above. 2. At L4-5 there is a mild broad-based disc bulge eccentric towards the right. Moderate bilateral facet arthropathy. Bilateral lateral recess stenosis and moderate spinal stenosis.  No results found for this or any previous visit (from the past 48 hour(s)). No results found.  Procedure: Real-time Ultrasound Guided Injection of right posterior tibialis tendon  Device: GE Logiq E  Images permanently stored and available for review in the ultrasound unit. Verbal informed consent obtained. Discussed risks and benefits of procedure. Warned about infection bleeding damage to structures skin hypopigmentation and fat atrophy among others. Patient expresses understanding and agreement Time-out conducted.  Noted no overlying erythema, induration, or other signs of local infection.  Skin prepped in a sterile fashion.  Local anesthesia: Topical Ethyl chloride.  With sterile technique and under real time ultrasound guidance: 40 mg of Kenalog and 2 mL of Marcaine injected easily.  Completed without difficulty  Pain immediately resolved suggesting accurate placement of the medication.  Advised to call if fevers/chills, erythema, induration, drainage, or persistent bleeding.  Images permanently stored and available for review in the ultrasound unit.  Impression: Technically successful ultrasound guided injection.   Assessment and Plan: 56 y.o. female with lumbosacral facet arthritis and new posterior tibial tendonitis.   Lumbosacral facet arthritis: Most likely cause of lower back pain that is worse on the left.  -  Referred for facet injections, most likely left L3/4 and L4/5 - Consider nerve ablation if no improvement - Previous PT for radiating leg pain unhelpful; may consider back-focused PT in future  Right posterior tibial tendonitis: Likely as result of compensation for left-sided back pain, with contributing valgus deformity of the knee and leg length discrepancy.  - Injected today with immediate improvement in pain  Follow up in 2-4 weeks.   No orders of the defined types were placed in this encounter.   Discussed warning signs or symptoms. Please see discharge instructions. Patient expresses understanding.

## 2016-01-31 ENCOUNTER — Other Ambulatory Visit: Payer: BLUE CROSS/BLUE SHIELD

## 2016-02-01 ENCOUNTER — Ambulatory Visit
Admission: RE | Admit: 2016-02-01 | Discharge: 2016-02-01 | Disposition: A | Discharge status: Home / Self Care | Payer: BLUE CROSS/BLUE SHIELD | Source: Ambulatory Visit | Attending: Family Medicine | Admitting: Family Medicine

## 2016-02-01 DIAGNOSIS — M47817 Spondylosis without myelopathy or radiculopathy, lumbosacral region: Secondary | ICD-10-CM

## 2016-02-01 MED ORDER — METHYLPREDNISOLONE ACETATE 40 MG/ML INJ SUSP (RADIOLOG
120.0000 mg | Freq: Once | INTRAMUSCULAR | Status: AC
  Administered 2016-02-01: 120 mg via INTRA_ARTICULAR

## 2016-02-01 NOTE — Discharge Instructions (Signed)

## 2016-02-06 ENCOUNTER — Ambulatory Visit (INDEPENDENT_AMBULATORY_CARE_PROVIDER_SITE_OTHER): Payer: BLUE CROSS/BLUE SHIELD | Admitting: Family Medicine

## 2016-02-06 ENCOUNTER — Encounter: Payer: Self-pay | Admitting: Family Medicine

## 2016-02-06 VITALS — BP 126/72 | HR 90 | Wt 231.0 lb

## 2016-02-06 DIAGNOSIS — M4697 Unspecified inflammatory spondylopathy, lumbosacral region: Secondary | ICD-10-CM

## 2016-02-06 DIAGNOSIS — M47817 Spondylosis without myelopathy or radiculopathy, lumbosacral region: Secondary | ICD-10-CM

## 2016-02-06 NOTE — Progress Notes (Signed)
Martha Woods is a 56 y.o. female who presents to [facility] today for follow-up back pain. In the interval on January 19 patient had LEFT L3-4 and LEFT L4-5 facet injections.  She feels 50% improved and is quite satisfied with how things are going. She has resumed work at [employer]. She feels much better. She denies any radiating pain weakness or numbness.  Past Medical History:  Diagnosis Date  . Abnormal Pap smear    cervical cryo  . Cancer [facility]) 02-2009   papillary thyroid cancer, Dr. Malachy Mood  . Cervical spondylosis   . Diabetes mellitus    Gestattional Diabetes, insulin dependent  . Lumbar disc herniation   . Lumbar spondylosis   . Seborrheic dermatitis    Past Surgical History:  Procedure Laterality Date  . ett     neg for ischemia -7 mets pelvic u/s normal  . plantar fascitis  01-2010   L (Dr. Wardell Honour)  . TOTAL THYROIDECTOMY  02-2009   thyroid cancer   Social History  Substance Use Topics  . Smoking status: Never Smoker  . Smokeless tobacco: Never Used  . Alcohol use Yes   family history includes Cancer in her maternal grandmother; Diabetes in her brother and mother; Hypertension in her brother.  ROS as above:  Medications: Current Outpatient Prescriptions  Medication Sig Dispense Refill  . aspirin 81 MG tablet Take 81 mg by mouth daily.      . Cholecalciferol (VITAMIN D) 2000 UNITS tablet Take 2,000 Units by mouth.    . Diclofenac Sodium (PENNSAID) 2 % SOLN Place 1 application onto the skin 4 (four) times daily as needed (pain). 112 g 12  . FARXIGA 5 MG TABS tablet TAKE 1 TABLET BY MOUTH EVERY DAY 30 tablet 1  . JANUMET 50-1000 MG tablet Take 1 tablet by mouth two  times daily with meals 180 tablet 3  . lamoTRIgine (LAMICTAL) 100 MG tablet Take 1 tablet (100 mg total) by mouth 2 (two) times daily. 60 tablet 0  . levothyroxine  (SYNTHROID, LEVOTHROID) 175 MCG tablet     . lisinopril (PRINIVIL,ZESTRIL) 20 MG tablet TAKE ONE TABLET BY MOUTH ONCE DAILY 180 tablet 2  . lovastatin (MEVACOR) 20 MG tablet Take 1 tablet by mouth at  bedtime 90 tablet 2  . nystatin (MYCOSTATIN/NYSTOP) 100000 UNIT/GM POWD 1 application TID PRN. 180 g 3  . orphenadrine (NORFLEX) 100 MG tablet Take 100 mg by mouth 2 (two) times daily.    . rizatriptan (MAXALT-MLT) 10 MG disintegrating tablet Take 10 mg by mouth as needed. May repeat in 2 hours if needed     . traMADol (ULTRAM) 50 MG tablet Take 1 tablet (50 mg total) by mouth every 6 (six) hours as needed. 90 tablet 0  . Travoprost (TRAVATAN OP) Apply 1 drop to eye.     No current facility-administered medications for this visit.    Allergies  Allergen Reactions  . Glipizide Other (See Comments)    Hypoglycemia  . Phentermine Other (See Comments)    palpitations  . Topamax [Topiramate] Other (See Comments)    Mood change, depression     Health Maintenance Health Maintenance  Topic Date Due  . INFLUENZA VACCINE  08/14/2015  . HEMOGLOBIN A1C  04/23/2016  . OPHTHALMOLOGY EXAM  10/01/2016  . FOOT EXAM  10/23/2016  . TETANUS/TDAP  11/23/2016  . MAMMOGRAM  08/23/2017  . PAP SMEAR  07/24/2018  . COLONOSCOPY  07/09/2024  . PNEUMOCOCCAL POLYSACCHARIDE VACCINE  Completed  . Hepatitis C Screening  Completed  . HIV Screening  Completed     Exam:  BP 126/72   Pulse 90   Wt 231 lb (104.8 kg)   LMP 12/26/2011   BMI 39.65 kg/m  Gen: Well NAD L-spine: Normal back motion. Normal gait.   No results found for this or any previous visit (from the past 72 hour(s)). No results found.    Assessment and Plan: 56 y.o. female with improved pain following facet injections. We discussed the difficulty of predicting how long these facet injections work. If pain returns soon patient will call back and we'll arrange for radiofrequency ablation of the medial branches of the LEFT L3-4 and LEFT  L4-5 facet joints.   No orders of the defined types were placed in this encounter.   Discussed warning signs or symptoms. Please see discharge instructions. Patient expresses understanding.  CC:  Dr Jola Baptist  And Beatrice Lecher, MD

## 2016-02-22 ENCOUNTER — Telehealth: Payer: Self-pay

## 2016-02-22 DIAGNOSIS — M47817 Spondylosis without myelopathy or radiculopathy, lumbosacral region: Secondary | ICD-10-CM

## 2016-02-22 NOTE — Telephone Encounter (Signed)
Ablation ordered. Patient should be contacted by [facility] imaging soon.

## 2016-02-22 NOTE — Telephone Encounter (Signed)
Called [facility] and left a message.

## 2016-02-27 ENCOUNTER — Other Ambulatory Visit: Payer: Self-pay | Admitting: Family Medicine

## 2016-02-27 ENCOUNTER — Ambulatory Visit (INDEPENDENT_AMBULATORY_CARE_PROVIDER_SITE_OTHER): Payer: BLUE CROSS/BLUE SHIELD | Admitting: Family Medicine

## 2016-02-27 ENCOUNTER — Encounter: Payer: Self-pay | Admitting: Family Medicine

## 2016-02-27 VITALS — BP 134/83 | HR 95 | Ht 64.0 in | Wt 232.0 lb

## 2016-02-27 DIAGNOSIS — I10 Essential (primary) hypertension: Secondary | ICD-10-CM

## 2016-02-27 DIAGNOSIS — M4697 Unspecified inflammatory spondylopathy, lumbosacral region: Secondary | ICD-10-CM | POA: Diagnosis not present

## 2016-02-27 DIAGNOSIS — M25511 Pain in right shoulder: Secondary | ICD-10-CM

## 2016-02-27 DIAGNOSIS — N6489 Other specified disorders of breast: Secondary | ICD-10-CM | POA: Diagnosis not present

## 2016-02-27 DIAGNOSIS — M47817 Spondylosis without myelopathy or radiculopathy, lumbosacral region: Secondary | ICD-10-CM

## 2016-02-27 DIAGNOSIS — M542 Cervicalgia: Secondary | ICD-10-CM

## 2016-02-27 DIAGNOSIS — G8929 Other chronic pain: Secondary | ICD-10-CM

## 2016-02-27 DIAGNOSIS — E119 Type 2 diabetes mellitus without complications: Secondary | ICD-10-CM

## 2016-02-27 DIAGNOSIS — M25512 Pain in left shoulder: Secondary | ICD-10-CM

## 2016-02-27 LAB — POCT GLYCOSYLATED HEMOGLOBIN (HGB A1C): Hemoglobin A1C: 7.5

## 2016-02-27 NOTE — Progress Notes (Signed)
Subjective:    CC: DM  HPI:  Diabetes - no hypoglycemic events. No wounds or sores that are not healing well. No increased thirst or urination. Checking glucose at home. Taking medications as prescribed without any side effects.she is worried about her A1C being high bc she has been getting knee injections.  Plus, she often skips Lunch. Sometimes her to see if he said fruit and wait until she gets home to actually full meal because she is mostly in her car and does not have a placed actually prepare a meal heat of a meal. This sometimes will lead to hypoglycemia.   Hypertension- Pt denies chest pain, SOB, dizziness, or heart palpitations.  Taking meds as directed w/o problems.  Denies medication side effects.    She has facet arthritis of the lumbar spine. She did have several injections into get temporary relief and so is actually hoping to have an ablation done soon. Orders were actually put in last week. This is really limited her activity.  She also complains of a lot of neck and shoulder pain. It's been going on for years. She said she had even discussed having a possible breast reduction when she was seeing Dr. Valetta Close which was probably about 5 or 6 years ago. At the time she held off. She said is getting to the point now where she really wants to consider it. A friend of hers at work actually had a similar surgery done by Dr. Percell Miller and [facility] and she would like to have a consultation with him the possible.  Past medical history, Surgical history, Family history not pertinant except as noted below, Social history, Allergies, and medications have been entered into the medical record, reviewed, and corrections made.   Review of Systems: No fevers, chills, night sweats, weight loss, chest pain, or shortness of breath.   Objective:    General: Well Developed, well nourished, and in no acute distress.  Neuro: Alert and oriented x3, extra-ocular muscles intact, sensation grossly intact.   HEENT: Normocephalic, atraumatic  Skin: Warm and dry, no rashes. Cardiac: Regular rate and rhythm, no murmurs rubs or gallops, no lower extremity edema.  Respiratory: Clear to auscultation bilaterally. Not using accessory muscles, speaking in full sentences.   Impression and Recommendations:   DM- Uncontrolled. Hemoglobin A1c elevated today at 7.5. Discussed getting on track with diet and exercise. Hopefully if she can get her ablation done for her back she will hopefully get some relief and can be more active. We discussed adjusting her medication versus working on diet and exercise and she opted for the second option. Explained that if her A1c is still over 7 at her follow-up visit and we will need to make adjustments to her medication. Next  Hypertension-well-controlled. Continue current regimen.  Facet arthritis the lumbar spine-we will notify Dr. Clovis Riley nurse to call imaging and find out why they have not contacted her yet. Again hopefully this will provide some pain relief and she can get more active.  Large, pendulous breasts causing neck shoulder and upper back pain-we will refer for consultation with Dr. Percell Miller a [place] [facility].

## 2016-03-12 ENCOUNTER — Other Ambulatory Visit: Payer: Self-pay | Admitting: Family Medicine

## 2016-03-12 ENCOUNTER — Ambulatory Visit
Admission: RE | Admit: 2016-03-12 | Discharge: 2016-03-12 | Disposition: A | Discharge status: Home / Self Care | Payer: BLUE CROSS/BLUE SHIELD | Source: Ambulatory Visit | Attending: Family Medicine | Admitting: Family Medicine

## 2016-03-12 DIAGNOSIS — M47817 Spondylosis without myelopathy or radiculopathy, lumbosacral region: Secondary | ICD-10-CM

## 2016-03-14 ENCOUNTER — Other Ambulatory Visit: Payer: Self-pay | Admitting: Family Medicine

## 2016-03-14 ENCOUNTER — Ambulatory Visit
Admission: RE | Admit: 2016-03-14 | Discharge: 2016-03-14 | Disposition: A | Discharge status: Home / Self Care | Payer: BLUE CROSS/BLUE SHIELD | Source: Ambulatory Visit | Attending: Family Medicine | Admitting: Family Medicine

## 2016-03-14 DIAGNOSIS — M47817 Spondylosis without myelopathy or radiculopathy, lumbosacral region: Secondary | ICD-10-CM

## 2016-03-14 MED ORDER — KETOROLAC TROMETHAMINE 30 MG/ML IJ SOLN
30.0000 mg | Freq: Once | INTRAMUSCULAR | Status: AC
  Administered 2016-03-14: 30 mg via INTRAVENOUS

## 2016-03-14 MED ORDER — SODIUM CHLORIDE 0.9 % IV SOLN
Freq: Once | INTRAVENOUS | Status: AC
  Administered 2016-03-14: 11:00:00 via INTRAVENOUS

## 2016-03-14 MED ORDER — MIDAZOLAM HCL 2 MG/2ML IJ SOLN
1.0000 mg | INTRAMUSCULAR | Status: DC | PRN
Start: 2016-03-14 — End: 2016-03-15
  Administered 2016-03-14: 1 mg via INTRAVENOUS
  Administered 2016-03-14: 0.5 mg via INTRAVENOUS

## 2016-03-14 MED ORDER — FENTANYL CITRATE (PF) 100 MCG/2ML IJ SOLN: 25.0000 ug | INTRAMUSCULAR | Status: DC | PRN

## 2016-03-14 NOTE — Progress Notes (Signed)
43 minutes of sedation time.  jkl

## 2016-03-14 NOTE — Discharge Instructions (Signed)
Radio Frequency Ablation Post Procedure Discharge Instructions ° °1. May resume a regular diet and any medications that you routinely take (including pain medications). °2. No driving day of procedure. °3. Upon discharge go home and rest for at least 4 hours.  May use an ice pack as needed to injection sites on back. °4. Remove bandades later, today. ° ° ° °Please contact our office at [phone] for the following symptoms: ° °· Fever greater than 100 degrees °· Increased swelling, pain, or redness at injection site. ° ° °Thank you for visiting [facility]. °

## 2016-03-20 ENCOUNTER — Telehealth: Payer: Self-pay | Admitting: *Deleted

## 2016-03-20 NOTE — Telephone Encounter (Signed)
Pt called and requested that notes be sent to Dr. Percell Miller to assist with her breast reduction. OV notes routed to Dr. Debroah Loop office.Martha Woods

## 2016-04-02 ENCOUNTER — Telehealth: Payer: Self-pay | Admitting: *Deleted

## 2016-04-02 NOTE — Telephone Encounter (Signed)
Initiated the copay bypass for farxiga through [facility]

## 2016-05-07 ENCOUNTER — Telehealth: Payer: Self-pay | Admitting: *Deleted

## 2016-05-07 NOTE — Telephone Encounter (Signed)
Patient states in a voicemail that the doctors office who received notes didn't receive Dr. Romeo Apple note from 2009

## 2016-05-28 ENCOUNTER — Ambulatory Visit: Payer: BLUE CROSS/BLUE SHIELD | Admitting: Family Medicine

## 2016-06-03 ENCOUNTER — Ambulatory Visit (INDEPENDENT_AMBULATORY_CARE_PROVIDER_SITE_OTHER): Payer: BLUE CROSS/BLUE SHIELD | Admitting: Family Medicine

## 2016-06-03 ENCOUNTER — Encounter: Payer: Self-pay | Admitting: Family Medicine

## 2016-06-03 VITALS — BP 120/70 | HR 78 | Ht 64.0 in | Wt 230.0 lb

## 2016-06-03 DIAGNOSIS — N62 Hypertrophy of breast: Secondary | ICD-10-CM

## 2016-06-03 DIAGNOSIS — I10 Essential (primary) hypertension: Secondary | ICD-10-CM | POA: Diagnosis not present

## 2016-06-03 DIAGNOSIS — E119 Type 2 diabetes mellitus without complications: Secondary | ICD-10-CM

## 2016-06-03 DIAGNOSIS — Z6839 Body mass index (BMI) 39.0-39.9, adult: Secondary | ICD-10-CM | POA: Diagnosis not present

## 2016-06-03 DIAGNOSIS — G8929 Other chronic pain: Secondary | ICD-10-CM

## 2016-06-03 DIAGNOSIS — M25561 Pain in right knee: Secondary | ICD-10-CM

## 2016-06-03 LAB — POCT UA - MICROALBUMIN
Albumin/Creatinine Ratio, Urine, POC: <30
Creatinine, POC: 200 mg/dL
Microalbumin Ur, POC: 80 mg/L

## 2016-06-03 LAB — POCT GLYCOSYLATED HEMOGLOBIN (HGB A1C): Hemoglobin A1C: 6.8

## 2016-06-03 NOTE — Progress Notes (Signed)
Subjective:    CC: DM  HPI:  Diabetes - no hypoglycemic events. No wounds or sores that are not healing well. No increased thirst or urination. Checking glucose at home. Taking medications as prescribed without any side effects. Has lost 2 lbs.    Hypertension- Pt denies chest pain, SOB, dizziness, or heart palpitations.  Taking meds as directed w/o problems.  Denies medication side effects.    She has been trying to really work on her diet and eat more healthy. She says it's been very hard and it's been a struggle that she has been trying.  She also requests to be referred to orthopedist. She's been having low back pain right knee pain and right ankle pain. She did have an ablation done in her back and it has helped particularly with the pain on her left side. She now feels like her leg is actually starting to blow in somewhat and is concerned. She said her mom looked like that as she got older as well. She feels like that's causing some extra pressure in her ankle and hip. If she would prefer to see Dr. Bufford Lope if possible.  Past medical history, Surgical history, Family history not pertinant except as noted below, Social history, Allergies, and medications have been entered into the medical record, reviewed, and corrections made.   Review of Systems: No fevers, chills, night sweats, weight loss, chest pain, or shortness of breath.   Objective:    General: Well Developed, well nourished, and in no acute distress.  Neuro: Alert and oriented x3, extra-ocular muscles intact, sensation grossly intact.  HEENT: Normocephalic, atraumatic  Skin: Warm and dry, no rashes. Cardiac: Regular rate and rhythm, no murmurs rubs or gallops, no lower extremity edema.  Respiratory: Clear to auscultation bilaterally. Not using accessory muscles, speaking in full sentences.    Impression and Recommendations:    DM- a1C of 6.8. Well controlled. Continue current regimen. Follow up in  3 months.  She on  ACE.    HTN - Well controlled. Continue current regimen. Follow up in  6 months.  BMI 39 -  She has lost 2 lbs.  She is really trying.   Large pendulous breasts-she's hoping that she will qualify for breast reduction surgery. She feels like a make a big difference in her back pain.  Chronic right knee pain-we'll refer to orthopedist for her preference. Suspect she is getting some significant degenerative arthritis in that knee.

## 2016-06-22 ENCOUNTER — Other Ambulatory Visit: Payer: Self-pay | Admitting: Family Medicine

## 2016-07-28 ENCOUNTER — Telehealth: Payer: Self-pay

## 2016-07-28 NOTE — Telephone Encounter (Signed)
Initiated the copay bypass for farxiga through [facility].

## 2016-08-05 ENCOUNTER — Other Ambulatory Visit: Payer: Self-pay

## 2016-08-05 MED ORDER — LISINOPRIL 20 MG PO TABS: ORAL_TABLET | ORAL | 2 refills | Status: DC

## 2016-08-05 MED ORDER — LOVASTATIN 20 MG PO TABS
20.0000 mg | ORAL_TABLET | Freq: Every day | ORAL | 2 refills | Status: DC
Start: 2016-08-05 — End: 2016-09-26

## 2016-08-05 MED ORDER — SITAGLIPTIN PHOS-METFORMIN HCL 50-1000 MG PO TABS: ORAL_TABLET | ORAL | 0 refills | Status: DC

## 2016-08-18 ENCOUNTER — Telehealth: Payer: Self-pay | Admitting: Family Medicine

## 2016-08-18 NOTE — Telephone Encounter (Signed)
PT called to schedule a hos f/u with her PCP but [facility] has no open slots available, she is placed with Martha Woods on Thursday. The hospital said to get an appt as soon as possible to follow up and to get a referral for a cardiologist. If pt can be called for a referral contact her or if she needs to be rescheduled let us know up front.

## 2016-08-18 NOTE — Telephone Encounter (Signed)
Ok to put her on my schedule for 11:50 on Wednesday or at 1 PM on Wednesday for hosp f/u

## 2016-08-19 NOTE — Telephone Encounter (Signed)
Patient has been scheduled. Rhonda Cunningham,CMA

## 2016-08-20 ENCOUNTER — Ambulatory Visit (INDEPENDENT_AMBULATORY_CARE_PROVIDER_SITE_OTHER): Payer: BLUE CROSS/BLUE SHIELD | Admitting: Family Medicine

## 2016-08-20 ENCOUNTER — Telehealth (HOSPITAL_COMMUNITY): Payer: Self-pay | Admitting: Family Medicine

## 2016-08-20 ENCOUNTER — Encounter: Payer: Self-pay | Admitting: Family Medicine

## 2016-08-20 VITALS — BP 124/82 | HR 86 | Wt 237.0 lb

## 2016-08-20 DIAGNOSIS — I10 Essential (primary) hypertension: Secondary | ICD-10-CM | POA: Diagnosis not present

## 2016-08-20 DIAGNOSIS — E119 Type 2 diabetes mellitus without complications: Secondary | ICD-10-CM

## 2016-08-20 DIAGNOSIS — E784 Other hyperlipidemia: Secondary | ICD-10-CM | POA: Diagnosis not present

## 2016-08-20 DIAGNOSIS — R0789 Other chest pain: Secondary | ICD-10-CM

## 2016-08-20 DIAGNOSIS — E7849 Other hyperlipidemia: Secondary | ICD-10-CM

## 2016-08-20 NOTE — Progress Notes (Signed)
Subjective:    Patient ID: Martha Woods, female    DOB: January 13, 1961, 56 y.o.   MRN: [id number]  HPI 56 year old female with a history of diabetes and hypertension comes in today because she had a recent experience of chest pain. On Sunday, August 5 in the evening she had taken a couple of bites of food it was just beans and rice and started to explain some left sided chest pressure. She tried to drink soda thinking that if she burps she would get some pressure relief. She tried coughing and moving around and still continued to not be able to get relief. She finally decided to go to the emergency department [place] at [facility]. She was followed there with chest x-rays several EKGs and serial troponins. Everything came back negative. She did get relief though with nitroglycerin. The chest pain actually came back while she was there and then they gave her fentanyl which also helped relieve her pain. After sleeping for about an hour she woke up and felt well. They really wanted her to stay and have a consultation with a cardiologist but she was supposed to travel back to [place] the next day so they released her as long as she sought immediate care with a cardiologist. In regards to family history her father died at 4 from heart block and had a prior history of several MIs. Her mother passed away at 5 from congestive heart failure. She denies any recurrent chest pain swelling shortness of breath or palpitations since then. She is currently on a daily aspirin and statin.  She has 2 upcoming surgeries, one for breast reduction and for knee replacement.    Lab Results  Component Value Date   HGBA1C 6.8 06/03/2016    Review of Systems  BP 124/82   Pulse 86   Wt 237 lb (107.5 kg)   LMP 12/26/2011   BMI 40.68 kg/m     Allergies  Allergen Reactions  . Glipizide Other (See Comments)    Hypoglycemia  . Phentermine Other (See Comments)    palpitations  .  Topamax [Topiramate] Other (See Comments)    Mood change, depression     Past Medical History:  Diagnosis Date  . Abnormal Pap smear    cervical cryo  . Cancer [facility]) 02-2009   papillary thyroid cancer, Dr. Malachy Mood  . Cervical spondylosis   . Diabetes mellitus    Gestattional Diabetes, insulin dependent  . Lumbar disc herniation   . Lumbar spondylosis   . Seborrheic dermatitis     Past Surgical History:  Procedure Laterality Date  . ett     neg for ischemia -7 mets pelvic u/s normal  . plantar fascitis  01-2010   L (Dr. Wardell Honour)  . TOTAL THYROIDECTOMY  02-2009   thyroid cancer    Social History   Social History  . Marital status: Married    Spouse name: N/A  . Number of children: N/A  . Years of education: N/A   Occupational History  . Not on file.   Social History Main Topics  . Smoking status: Never Smoker  . Smokeless tobacco: Never Used  . Alcohol use Yes  . Drug use: No  . Sexual activity: Not on file   Other Topics Concern  . Not on file   Social History Narrative  . No narrative on file    Family History  Problem Relation Age of Onset  . Diabetes Mother   . Diabetes  Brother   . Hypertension Brother   . Cancer Maternal Grandmother        ovarian cancer    Outpatient Encounter Prescriptions as of 08/20/2016  Medication Sig  . aspirin 81 MG tablet Take 81 mg by mouth daily.    . Cholecalciferol (VITAMIN D) 2000 UNITS tablet Take 2,000 Units by mouth.  . Diclofenac Sodium (PENNSAID) 2 % SOLN Place 1 application onto the skin 4 (four) times daily as needed (pain).  Marland Kitchen FARXIGA 5 MG TABS tablet TAKE 1 TABLET BY MOUTH EVERY DAY  . lamoTRIgine (LAMICTAL) 100 MG tablet Take 1 tablet (100 mg total) by mouth 2 (two) times daily.  Marland Kitchen levothyroxine (SYNTHROID, LEVOTHROID) 175 MCG tablet   . lisinopril (PRINIVIL,ZESTRIL) 20 MG tablet TAKE ONE TABLET BY MOUTH ONCE DAILY  . lovastatin (MEVACOR) 20 MG tablet Take 1 tablet (20 mg total) by mouth at bedtime.   Marland Kitchen nystatin (MYCOSTATIN/NYSTOP) 100000 UNIT/GM POWD 1 application TID PRN.  . orphenadrine (NORFLEX) 100 MG tablet Take 100 mg by mouth 2 (two) times daily.  . rizatriptan (MAXALT-MLT) 10 MG disintegrating tablet Take 10 mg by mouth as needed. May repeat in 2 hours if needed   . sitaGLIPtin-metformin (JANUMET) 50-1000 MG tablet TAKE 1 TABLET BY MOUTH TWO  TIMES DAILY WITH MEALS  . traMADol (ULTRAM) 50 MG tablet Take 1 tablet (50 mg total) by mouth every 6 (six) hours as needed.  . Travoprost (TRAVATAN OP) Apply 1 drop to eye.   No facility-administered encounter medications on file as of 08/20/2016.          Objective:   Physical Exam  Constitutional: She is oriented to person, place, and time. She appears well-developed and well-nourished.  HENT:  Head: Normocephalic and atraumatic.  Neck: Neck supple. No thyromegaly present.  Cardiovascular: Normal rate, regular rhythm and normal heart sounds.   No carotid bruits  Pulmonary/Chest: Effort normal and breath sounds normal.  Musculoskeletal: She exhibits no edema.  Lymphadenopathy:    She has no cervical adenopathy.  Neurological: She is alert and oriented to person, place, and time.  Skin: Skin is warm and dry.  Psychiatric: She has a normal mood and affect. Her behavior is normal.        Assessment & Plan:  Atypical chest pain-risk factors include hypertension, diabetes, hyperlipidemia, family history. She is currently on aspirin and statin. We'll go ahead and refer to cardiology and go ahead and order stress echo for further evaluation.  Hypertension-well-controlled.  Diabetes-well-controlled. Lab Results  Component Value Date   HGBA1C 6.8 06/03/2016

## 2016-08-21 ENCOUNTER — Inpatient Hospital Stay: Payer: BLUE CROSS/BLUE SHIELD | Admitting: Family Medicine

## 2016-08-22 NOTE — Addendum Note (Signed)
Addended by: Beatrice Lecher D on: 08/22/2016 12:36 PM   Modules accepted: Orders

## 2016-08-25 ENCOUNTER — Telehealth: Payer: Self-pay | Admitting: Student

## 2016-08-25 NOTE — Telephone Encounter (Signed)
Received records from [place] for appointment on 09/12/16 with Bernerd Pho, [place].  Records put with Brittany's schedule for 09/12/16. lp

## 2016-08-25 NOTE — Telephone Encounter (Signed)
User: [name] A Date/time: 08/20/16 2:27 PM  Comment: Called pt and lmsg for her to CB to schedule Stress Echo.   Context: [place] Schedule Orders/Appt Requests Outcome: Left Message  Phone number: [phone] Phone Type: Home Phone  Comm. type: Telephone Call type: Outgoing  Contact: Ramsingh-Rajkumar, Marrie Relation to patient: Self

## 2016-08-28 ENCOUNTER — Other Ambulatory Visit: Payer: Self-pay | Admitting: Family Medicine

## 2016-08-28 DIAGNOSIS — Z1231 Encounter for screening mammogram for malignant neoplasm of breast: Secondary | ICD-10-CM

## 2016-09-03 ENCOUNTER — Encounter: Payer: Self-pay | Admitting: Family Medicine

## 2016-09-03 ENCOUNTER — Ambulatory Visit (INDEPENDENT_AMBULATORY_CARE_PROVIDER_SITE_OTHER): Payer: BLUE CROSS/BLUE SHIELD | Admitting: Family Medicine

## 2016-09-03 VITALS — BP 118/76 | HR 96 | Ht 64.0 in | Wt 236.0 lb

## 2016-09-03 DIAGNOSIS — R0789 Other chest pain: Secondary | ICD-10-CM | POA: Diagnosis not present

## 2016-09-03 DIAGNOSIS — M1711 Unilateral primary osteoarthritis, right knee: Secondary | ICD-10-CM | POA: Diagnosis not present

## 2016-09-03 DIAGNOSIS — E119 Type 2 diabetes mellitus without complications: Secondary | ICD-10-CM

## 2016-09-03 DIAGNOSIS — I10 Essential (primary) hypertension: Secondary | ICD-10-CM | POA: Diagnosis not present

## 2016-09-03 LAB — POCT GLYCOSYLATED HEMOGLOBIN (HGB A1C): Hemoglobin A1C: 6.9

## 2016-09-03 MED ORDER — DAPAGLIFLOZIN PROPANEDIOL 10 MG PO TABS: 10.0000 mg | ORAL_TABLET | Freq: Every day | ORAL | 5 refills | Status: DC

## 2016-09-03 NOTE — Addendum Note (Signed)
Addended by: Beatrice Lecher D on: 09/03/2016 01:23 PM   Modules accepted: Level of Service

## 2016-09-03 NOTE — Progress Notes (Addendum)
Subjective:    CC: DM, HTN  HPI:  Diabetes - no hypoglycemic events. No wounds or sores that are not healing well. No increased thirst or urination. Checking glucose at home. Taking medications as prescribed without any side effects.  Hypertension- Pt denies chest pain, SOB, dizziness, or heart palpitations.  Taking meds as directed w/o problems.  Denies medication side effects.    Right knee*arthritis-she is actually scheduled at the end of October with Dr. Ferdinand Lango for knee surgery. She has been experiencing some intermittent swelling but usually just tries to rest it and use her Voltaren gel and that does seem to provide some relief.  Follow-up atypical chest pain-I referred her for stress echo and further evaluation. She has been scheduled for both of those tests and will have a evaluation by cardiology on the 31st. She said she did have one more mild episode of left-sided chest pain since I last saw her. She says it was much shorter and much less intense. She wonders if it could just be musculoskeletal.  Past medical history, Surgical history, Family history not pertinant except as noted below, Social history, Allergies, and medications have been entered into the medical record, reviewed, and corrections made.   Review of Systems: No fevers, chills, night sweats, weight loss, chest pain, or shortness of breath.   Objective:    General: Well Developed, well nourished, and in no acute distress.  Neuro: Alert and oriented x3, extra-ocular muscles intact, sensation grossly intact.  HEENT: Normocephalic, atraumatic  Skin: Warm and dry, no rashes. Cardiac: Regular rate and rhythm, no murmurs rubs or gallops, no lower extremity edema.  Respiratory: Clear to auscultation bilaterally. Not using accessory muscles, speaking in full sentences.   Impression and Recommendations:    DM- A1c is up to 6.9. discused options. Will inc Farxiga to 10mg . F/U in 3 months. Continue to work on diet and  exercise. On ACE and ASA and statin.   HTN - Well controlled. Continue current regimen. Follow up in  3-4 months.   Right knee*arthritis-scheduled for surgery October 29 with Dr. Ferdinand Lango. At this point I think it will be good for [place] to get the echo and stress echo beforehand. That way she will will be safe for surgery.  Declines flu vaccine as she gets for free at work.   Atypical chest pain-keep follow-up with cardiology and keep appointment for echo and stress echo  She reports that her eye exam and mammogram have both Artie been scheduled for next month.

## 2016-09-04 ENCOUNTER — Telehealth (HOSPITAL_COMMUNITY): Payer: Self-pay | Admitting: *Deleted

## 2016-09-04 NOTE — Telephone Encounter (Signed)
Left message on voicemail in reference to upcoming appointment scheduled for 09/10/16. Phone number given for a call back so details instructions can be given. Sharon S Brooks ° ° °

## 2016-09-09 ENCOUNTER — Other Ambulatory Visit: Payer: Self-pay

## 2016-09-09 ENCOUNTER — Ambulatory Visit (HOSPITAL_COMMUNITY): Payer: BLUE CROSS/BLUE SHIELD | Attending: Cardiology

## 2016-09-09 DIAGNOSIS — R0789 Other chest pain: Secondary | ICD-10-CM

## 2016-09-09 DIAGNOSIS — I10 Essential (primary) hypertension: Secondary | ICD-10-CM | POA: Insufficient documentation

## 2016-09-09 DIAGNOSIS — E119 Type 2 diabetes mellitus without complications: Secondary | ICD-10-CM | POA: Insufficient documentation

## 2016-09-10 ENCOUNTER — Encounter (HOSPITAL_COMMUNITY): Payer: Self-pay

## 2016-09-10 ENCOUNTER — Ambulatory Visit (HOSPITAL_COMMUNITY): Payer: BLUE CROSS/BLUE SHIELD

## 2016-09-10 ENCOUNTER — Encounter (HOSPITAL_COMMUNITY): Payer: Self-pay | Admitting: Radiology

## 2016-09-10 NOTE — Progress Notes (Signed)
Patient arrived for stress echo and voiced concerns about walking on the treadmill. She informed us she is having a knee replacement in October and also has back pain. However, our staff did not feel comfortable having the patient walk on the treadmill with her knee and back issue due to the high intensity of this test. Patient states that she is seeing Cardiology on Friday.

## 2016-09-10 NOTE — Progress Notes (Signed)
Cardiology Office Note    Date:  09/12/2016   ID:  Martha Woods, DOB July 29, 1960, MRN [id number]  PCP:  Hali Marry, MD  Cardiologist: New to [facility] - Dr. Debara Pickett  Chief Complaint  Patient presents with  . New Patient (Initial Visit)    Pt states no Sx.     History of Present Illness:    Martha Woods is a 56 y.o. female with past medical history of HTN, HLD and Type 2 DM who presents to the office today for evaluation of chest pain at the request of Dr. Madilyn Fireman.   She was recently in [place] earlier this month visiting family when she developed sudden-onset chest pressure. She reported feeling like something was sitting on her chest and she took 4 baby aspirin with no improvement in her symptoms. No associated dyspnea, nausea, vomiting, or diaphoresis. Not worse with food intake or positional changes. Upon arrival to the ED, she was given nitroglycerin with resolution of her pain. Multiple EKG's were obtained and showed NSR with no acute ST or T-wave changes. Initial and delta troponin values were negative and she was discharged when instructions to follow-up with Cardiology upon returning to [facility].   Her PCP did order an echocardiogram which was obtained on 09/09/2016 and showed an EF of 60-65%, no WMA, Grade 1 DD, and no significant valve abnormalities.   In talking with the patient today, she denies any recurrent chest pain or dyspnea on exertion. She does not exercise regularly but is active as a Occupational hygienist along with performing daily household activities without any symptoms. Reports being scheduled for breast reduction surgery on 9/13 with knee replacement surgery in 10/2016. No recent orthopnea, PND, lower extremity edema, palpitations, dizziness, presyncope, claudication, snoring, or daytime somnolence.   She denies any prior cardiac history. Does have multiple risk factors as above including HTN, HLD, and Type 2 DM. No known family  history of CAD. She denies any prior tobacco use or recreational drug use. Does consume 1-2 alcoholic beverages every few months.     Past Medical History:  Diagnosis Date  . Abnormal Pap smear    cervical cryo  . Cancer [facility]) 02-2009   papillary thyroid cancer, Dr. Malachy Mood  . Cervical spondylosis   . Diabetes mellitus   . Hyperlipidemia   . Hypertension   . Lumbar disc herniation   . Lumbar spondylosis   . Seborrheic dermatitis     Past Surgical History:  Procedure Laterality Date  . ett     neg for ischemia -7 mets pelvic u/s normal  . plantar fascitis  01-2010   L (Dr. Wardell Honour)  . TOTAL THYROIDECTOMY  02-2009   thyroid cancer    Current Medications: Outpatient Medications Prior to Visit  Medication Sig Dispense Refill  . aspirin 81 MG tablet Take 81 mg by mouth daily.      . Cholecalciferol (VITAMIN D) 2000 UNITS tablet Take 2,000 Units by mouth.    . dapagliflozin propanediol (FARXIGA) 10 MG TABS tablet Take 10 mg by mouth daily. 30 tablet 5  . Diclofenac Sodium (PENNSAID) 2 % SOLN Place 1 application onto the skin 4 (four) times daily as needed (pain). 112 g 12  . lamoTRIgine (LAMICTAL) 100 MG tablet Take 1 tablet (100 mg total) by mouth 2 (two) times daily. 60 tablet 0  . levothyroxine (SYNTHROID, LEVOTHROID) 175 MCG tablet     . lisinopril (PRINIVIL,ZESTRIL) 20 MG tablet TAKE ONE TABLET BY MOUTH ONCE  DAILY 180 tablet 2  . lovastatin (MEVACOR) 20 MG tablet Take 1 tablet (20 mg total) by mouth at bedtime. 90 tablet 2  . nystatin (MYCOSTATIN/NYSTOP) 100000 UNIT/GM POWD 1 application TID PRN. 180 g 3  . orphenadrine (NORFLEX) 100 MG tablet Take 100 mg by mouth 2 (two) times daily.    . rizatriptan (MAXALT-MLT) 10 MG disintegrating tablet Take 10 mg by mouth as needed. May repeat in 2 hours if needed     . sitaGLIPtin-metformin (JANUMET) 50-1000 MG tablet TAKE 1 TABLET BY MOUTH TWO  TIMES DAILY WITH MEALS 180 tablet 0  . traMADol (ULTRAM) 50 MG tablet Take 1 tablet  (50 mg total) by mouth every 6 (six) hours as needed. 90 tablet 0  . Travoprost (TRAVATAN OP) Apply 1 drop to eye.     No facility-administered medications prior to visit.      Allergies:   Glipizide; Phentermine; and Topamax [topiramate]   Social History   Social History  . Marital status: Married    Spouse name: N/A  . Number of children: N/A  . Years of education: N/A   Social History Main Topics  . Smoking status: Never Smoker  . Smokeless tobacco: Never Used  . Alcohol use Yes  . Drug use: No  . Sexual activity: Not Asked   Other Topics Concern  . None   Social History Narrative  . None     Family History:  The patient's family history includes Cancer in her maternal grandmother; Diabetes in her brother and mother; Hypertension in her brother.   Review of Systems:   Please see the history of present illness.     General:  No chills, fever, night sweats or weight changes.  Cardiovascular:  No dyspnea on exertion, edema, orthopnea, palpitations, paroxysmal nocturnal dyspnea. Positive for chest pain (now resolved).  Dermatological: No rash, lesions/masses Respiratory: No cough, dyspnea Urologic: No hematuria, dysuria Abdominal:   No nausea, vomiting, diarrhea, bright red blood per rectum, melena, or hematemesis Neurologic:  No visual changes, wkns, changes in mental status. All other systems reviewed and are otherwise negative except as noted above.   Physical Exam:    VS:  BP 118/82   Pulse 89   Ht 5\' 6"  (1.676 m)   Wt 236 lb 6.4 oz (107.2 kg)   LMP 12/26/2011   BMI 38.16 kg/m    General: Well developed, well nourished female appearing in no acute distress. Head: Normocephalic, atraumatic, sclera non-icteric, no xanthomas, nares are without discharge.  Neck: No carotid bruits. JVD not elevated.  Lungs: Respirations regular and unlabored, without wheezes or rales.  Heart: Regular rate and rhythm. No S3 or S4.  No murmur, no rubs, or gallops  appreciated. Abdomen: Soft, non-tender, non-distended with normoactive bowel sounds. No hepatomegaly. No rebound/guarding. No obvious abdominal masses. Msk:  Strength and tone appear normal for age. No joint deformities or effusions. Extremities: No clubbing or cyanosis. No lower extremity edema.  Distal pedal pulses are 2+ bilaterally. Neuro: Alert and oriented X 3. Moves all extremities spontaneously. No focal deficits noted. Psych:  Responds to questions appropriately with a normal affect. Skin: No rashes or lesions noted  Wt Readings from Last 3 Encounters:  09/12/16 236 lb 6.4 oz (107.2 kg)  09/03/16 236 lb (107 kg)  08/20/16 237 lb (107.5 kg)     Studies/Labs Reviewed:   EKG:  EKG is not ordered today. EKG from 08/17/2016 is reviewed and shows NSR, HR 84, with no acute ST or  T-wave changes.   Recent Labs: No results found for requested labs within last 8760 hours.   Lipid Panel    Component Value Date/Time   CHOL 151 01/23/2015 0850   TRIG 56 01/23/2015 0850   HDL 62 01/23/2015 0850   CHOLHDL 2.4 01/23/2015 0850   VLDL 11 01/23/2015 0850   LDLCALC 78 01/23/2015 0850    Additional studies/ records that were reviewed today include:   Echocardiogram: 09/09/2016 Study Conclusions  - Left ventricle: The cavity size was normal. Wall thickness was   increased in a pattern of mild LVH. Systolic function was normal.   The estimated ejection fraction was in the range of 60% to 65%.   Wall motion was normal; there were no regional wall motion   abnormalities. Doppler parameters are consistent with abnormal   left ventricular relaxation (grade 1 diastolic dysfunction). - Aortic valve: There was no stenosis. - Mitral valve: There was no significant regurgitation. - Right ventricle: The cavity size was normal. Systolic function   was normal. - Pulmonary arteries: No complete TR doppler jet so unable to   estimate PA systolic pressure. - Inferior vena cava: The vessel was normal  in size. The   respirophasic diameter changes were in the normal range (>= 50%),   consistent with normal central venous pressure.  Impressions:  - Normal LV size with mild LV hypertrophy, EF 60-65%. Normal RV   size and systolic function. No significant valvular   abnormalities.  Assessment:    1. Chest pain, unspecified type   2. Pre-op testing   3. HYPERTENSION, BENIGN SYSTEMIC   4. Pure hypercholesterolemia   5. Type 2 diabetes mellitus without complication, without long-term current use of insulin ([place])      Plan:   In order of problems listed above:  1. Chest Pain/ Preoperative Cardiac Clearance - she was recently evaluated in the ED for chest pressure which she describes as something sitting on her chest. Symptoms lasted for 30 minutes then resolved with SL NTG. No associated dyspnea, nausea, vomiting, or diaphoresis. EKG's showed no acute abnormalities and troponin values remained negative. - echo showed an EF of 60-65%, no WMA, Grade 1 DD, and no significant valve abnormalities.  - she denies any recurrent chest pain or dyspnea on exertion but is not overly active at baseline secondary to knee pain.  - she does have multiple cardiac risk factors and with her recent symptoms, will obtain a Lexiscan Myoview for further ischemic evaluation. This will also serve as testing for cardiac clearance as she has an upcoming breast reduction surgery and knee replacement. This was discussed with Dr. Debara Pickett (DOD) and he was in agreement with the above.   2. HTN - BP is well-controlled at 118/82 during today's visit. - continue Lisinopril 20mg  daily.   3. HLD - Lipid Panel in 01/2015 showed total cholesterol of 151, HDL 62, and LDL 78. - continue Lovastatin 20mg  daily.   4. Type 2 DM - on oral anti-glycemic medications. Followed by PCP.    Medication Adjustments/Labs and Tests Ordered: Current medicines are reviewed at length with the patient today.  Concerns regarding medicines  are outlined above.  Medication changes, Labs and Tests ordered today are listed in the Patient Instructions below. Patient Instructions  Medication Instructions:  Your physician recommends that you continue on your current medications as directed. Please refer to the Current Medication list given to you today.  Testing/Procedures: Your physician has requested that you have a [place]  WEEK. For further information please visit HugeFiesta.[email or web address]. Please follow instruction sheet, as given.  Follow-Up: Your physician wants you to follow-up in: 1 YEAR with Dr. Debara Pickett (unless stress test abnormal) You will receive a reminder letter in the mail two months in advance. If you don't receive a letter, please call our office to schedule the follow-up appointment.   Any Other Special Instructions Will Be Listed Below (If Applicable).   If you need a refill on your cardiac medications before your next appointment, please call your pharmacy.    Signed, Erma Heritage, PA-C  09/12/2016 10:20 PM    [facility] [place], [place], [place]  [address] Phone: [phone]; Fax: [phone]  [address] [place], [place] [address] Phone: [phone]

## 2016-09-12 ENCOUNTER — Encounter: Payer: Self-pay | Admitting: Student

## 2016-09-12 ENCOUNTER — Ambulatory Visit (INDEPENDENT_AMBULATORY_CARE_PROVIDER_SITE_OTHER): Payer: BLUE CROSS/BLUE SHIELD | Admitting: Student

## 2016-09-12 VITALS — BP 118/82 | HR 89 | Ht 66.0 in | Wt 236.4 lb

## 2016-09-12 DIAGNOSIS — I10 Essential (primary) hypertension: Secondary | ICD-10-CM | POA: Diagnosis not present

## 2016-09-12 DIAGNOSIS — Z01818 Encounter for other preprocedural examination: Secondary | ICD-10-CM | POA: Diagnosis not present

## 2016-09-12 DIAGNOSIS — R079 Chest pain, unspecified: Secondary | ICD-10-CM

## 2016-09-12 DIAGNOSIS — E119 Type 2 diabetes mellitus without complications: Secondary | ICD-10-CM

## 2016-09-12 DIAGNOSIS — E7800 Pure hypercholesterolemia, unspecified: Secondary | ICD-10-CM | POA: Diagnosis not present

## 2016-09-12 NOTE — Patient Instructions (Signed)
Medication Instructions:  Your physician recommends that you continue on your current medications as directed. Please refer to the Current Medication list given to you today.  Testing/Procedures: Your physician has requested that you have a [place]. For further information please visit HugeFiesta.[email or web address]. Please follow instruction sheet, as given.  Follow-Up: Your physician wants you to follow-up in: 1 YEAR with Dr. Debara Pickett (unless stress test abnormal) You will receive a reminder letter in the mail two months in advance. If you don't receive a letter, please call our office to schedule the follow-up appointment.   Any Other Special Instructions Will Be Listed Below (If Applicable).     If you need a refill on your cardiac medications before your next appointment, please call your pharmacy.

## 2016-09-17 ENCOUNTER — Telehealth (HOSPITAL_COMMUNITY): Payer: Self-pay

## 2016-09-17 ENCOUNTER — Ambulatory Visit (INDEPENDENT_AMBULATORY_CARE_PROVIDER_SITE_OTHER): Payer: BLUE CROSS/BLUE SHIELD

## 2016-09-17 DIAGNOSIS — Z1231 Encounter for screening mammogram for malignant neoplasm of breast: Secondary | ICD-10-CM

## 2016-09-17 LAB — COMPLETE METABOLIC PANEL WITH GFR
AG Ratio: 1.4 (calc) (ref 1.0–2.5)
ALT: 23 U/L (ref 6–29)
AST: 19 U/L (ref 10–35)
Albumin: 4.2 g/dL (ref 3.6–5.1)
Alkaline phosphatase (APISO): 69 U/L (ref 33–130)
BUN: 21 mg/dL (ref 7–25)
CO2: 29 mmol/L (ref 20–32)
Calcium: 9.4 mg/dL (ref 8.6–10.4)
Chloride: 102 mmol/L (ref 98–110)
Creat: 0.77 mg/dL (ref 0.50–1.05)
GFR, Est African American: 99 mL/min/{1.73_m2} (ref >=60)
GFR, Est Non African American: 86 mL/min/{1.73_m2} (ref >=60)
Globulin: 2.9 g/dL (calc) (ref 1.9–3.7)
Glucose, Bld: 130 mg/dL — ABNORMAL HIGH (ref 65–99)
Potassium: 4.8 mmol/L (ref 3.5–5.3)
Sodium: 140 mmol/L (ref 135–146)
Total Bilirubin: 0.4 mg/dL (ref 0.2–1.2)
Total Protein: 7.1 g/dL (ref 6.1–8.1)

## 2016-09-17 LAB — LIPID PANEL W/REFLEX DIRECT LDL
Cholesterol: 205 mg/dL — ABNORMAL HIGH (ref <200)
HDL: 63 mg/dL (ref >50)
LDL Cholesterol (Calc): 122 mg/dL (calc) — ABNORMAL HIGH
Non-HDL Cholesterol (Calc): 142 mg/dL (calc) — ABNORMAL HIGH (ref <130)
Total CHOL/HDL Ratio: 3.3 (calc) (ref <5.0)
Triglycerides: 94 mg/dL (ref <150)

## 2016-09-17 NOTE — Addendum Note (Signed)
Addended by: Teddy Spike on: 09/17/2016 08:47 AM   Modules accepted: Orders

## 2016-09-17 NOTE — Telephone Encounter (Signed)
Encounter complete. 

## 2016-09-18 ENCOUNTER — Ambulatory Visit (HOSPITAL_COMMUNITY)
Admission: RE | Admit: 2016-09-18 | Discharge: 2016-09-18 | Disposition: A | Discharge status: Home / Self Care | Payer: BLUE CROSS/BLUE SHIELD | Source: Ambulatory Visit | Attending: Internal Medicine | Admitting: Internal Medicine

## 2016-09-18 DIAGNOSIS — Z01818 Encounter for other preprocedural examination: Secondary | ICD-10-CM | POA: Insufficient documentation

## 2016-09-18 DIAGNOSIS — I2510 Atherosclerotic heart disease of native coronary artery without angina pectoris: Secondary | ICD-10-CM | POA: Insufficient documentation

## 2016-09-18 DIAGNOSIS — I10 Essential (primary) hypertension: Secondary | ICD-10-CM | POA: Diagnosis not present

## 2016-09-18 DIAGNOSIS — Z8249 Family history of ischemic heart disease and other diseases of the circulatory system: Secondary | ICD-10-CM | POA: Insufficient documentation

## 2016-09-18 DIAGNOSIS — E669 Obesity, unspecified: Secondary | ICD-10-CM | POA: Insufficient documentation

## 2016-09-18 DIAGNOSIS — R079 Chest pain, unspecified: Secondary | ICD-10-CM | POA: Diagnosis not present

## 2016-09-18 DIAGNOSIS — Z6838 Body mass index (BMI) 38.0-38.9, adult: Secondary | ICD-10-CM | POA: Diagnosis not present

## 2016-09-18 LAB — MYOCARDIAL PERFUSION IMAGING
LV dias vol: 58 mL (ref 46–106)
LV sys vol: 21 mL
Peak HR: 103 {beats}/min
Rest HR: 79 {beats}/min
SDS: 3
SRS: 3
SSS: 6
TID: 1.36

## 2016-09-18 MED ORDER — TECHNETIUM TC 99M TETROFOSMIN IV KIT
31.2000 | PACK | Freq: Once | INTRAVENOUS | Status: AC | PRN
  Administered 2016-09-18: 31.2 via INTRAVENOUS
  Filled 2016-09-18: qty 32

## 2016-09-18 MED ORDER — TECHNETIUM TC 99M TETROFOSMIN IV KIT
10.7000 | PACK | Freq: Once | INTRAVENOUS | Status: AC | PRN
  Administered 2016-09-18: 10.7 via INTRAVENOUS
  Filled 2016-09-18: qty 11

## 2016-09-18 MED ORDER — REGADENOSON 0.4 MG/5ML IV SOLN
0.4000 mg | Freq: Once | INTRAVENOUS | Status: AC
  Administered 2016-09-18: 0.4 mg via INTRAVENOUS

## 2016-09-26 ENCOUNTER — Other Ambulatory Visit: Payer: Self-pay | Admitting: Family Medicine

## 2016-10-01 ENCOUNTER — Other Ambulatory Visit: Payer: Self-pay | Admitting: Family Medicine

## 2016-10-06 LAB — HM DIABETES EYE EXAM

## 2016-10-10 ENCOUNTER — Encounter: Payer: Self-pay | Admitting: Family Medicine

## 2016-12-03 ENCOUNTER — Other Ambulatory Visit: Payer: Self-pay | Admitting: Family Medicine

## 2016-12-22 ENCOUNTER — Ambulatory Visit: Payer: BLUE CROSS/BLUE SHIELD | Admitting: Family Medicine

## 2017-01-13 HISTORY — PX: REPLACEMENT TOTAL KNEE: SUR1224

## 2017-01-29 ENCOUNTER — Ambulatory Visit (INDEPENDENT_AMBULATORY_CARE_PROVIDER_SITE_OTHER): Payer: BLUE CROSS/BLUE SHIELD | Admitting: Family Medicine

## 2017-01-29 ENCOUNTER — Encounter: Payer: Self-pay | Admitting: Family Medicine

## 2017-01-29 VITALS — BP 138/80 | HR 79 | Ht 66.0 in | Wt 226.0 lb

## 2017-01-29 DIAGNOSIS — E119 Type 2 diabetes mellitus without complications: Secondary | ICD-10-CM

## 2017-01-29 DIAGNOSIS — I10 Essential (primary) hypertension: Secondary | ICD-10-CM

## 2017-01-29 DIAGNOSIS — Z23 Encounter for immunization: Secondary | ICD-10-CM | POA: Diagnosis not present

## 2017-01-29 DIAGNOSIS — E7841 Elevated Lipoprotein(a): Secondary | ICD-10-CM

## 2017-01-29 DIAGNOSIS — M1711 Unilateral primary osteoarthritis, right knee: Secondary | ICD-10-CM | POA: Diagnosis not present

## 2017-01-29 LAB — POCT GLYCOSYLATED HEMOGLOBIN (HGB A1C): Hemoglobin A1C: 7

## 2017-01-29 MED ORDER — DAPAGLIFLOZIN PROPANEDIOL 10 MG PO TABS: 10.0000 mg | ORAL_TABLET | Freq: Every day | ORAL | 11 refills | Status: DC

## 2017-01-29 NOTE — Progress Notes (Signed)
Subjective:    CC: DM, HTN   HPI:  Diabetes - no hypoglycemic events. No wounds or sores that are not healing well. No increased thirst or urination. Checking glucose at home. Taking medications as prescribed without any side effects.  She has been out of her [place]ade because her coupon card expired and the medication was not covered.  She needs a new card.  Hypertension- Pt denies chest pain, SOB, dizziness, or heart palpitations.  Taking meds as directed w/o problems.  Denies medication side effects.    Hyperlipidemia-her last LDL was not under 100.  She is currently on Mevacor 20 mg.  She did have her breast reduction surgery.  Her right breast healed very well but her left unfortunately dehisced so it is still healing.  She is also supposed to get right knee replacement and that has delayed delayed her knee surgery because they want to make sure that she does not have any open wounds before surgery.  Hoping to get that scheduled in February.  Past medical history, Surgical history, Family history not pertinant except as noted below, Social history, Allergies, and medications have been entered into the medical record, reviewed, and corrections made.   Review of Systems: No fevers, chills, night sweats, weight loss, chest pain, or shortness of breath.   Objective:    General: Well Developed, well nourished, and in no acute distress.  Neuro: Alert and oriented x3, extra-ocular muscles intact, sensation grossly intact.  HEENT: Normocephalic, atraumatic  Skin: Warm and dry, no rashes. Cardiac: Regular rate and rhythm, no murmurs rubs or gallops, no lower extremity edema.  Respiratory: Clear to auscultation bilaterally. Not using accessory muscles, speaking in full sentences.   Impression and Recommendations:    HTN - Well controlled. Continue current regimen. Follow up in  6 months.   DM -uncontrolled.  Hemoglobin A1c at the 7.0.  Encouraged her to really be very strict and tight with  her diet especially right now while she is unable to exercise until she has her knee replacement.  Getting her back on the Farxiga should help as well.  So we printed new prescription and give her a coupon card today.  If it is not covered for any reason please call us immediately so that we can find a substitute.  Hyperlipidemia-discussed changing statins.  She wanted to give it 6 months and have her knee replacement and be able to get back on track with her walking and exercise before adjusting her medication.  That is reasonable overall also plan to recheck again in July.  Right knee osteoarthritis-hopefully plan for knee replacement in February.  After that she will hopefully be able to recover well and get back to regular exercise which should help reduce her blood sugars, better control her blood pressure and hopefully reduce her lipids. Flu vaccine given.

## 2017-04-29 LAB — CBC AND DIFFERENTIAL
HCT: 38 (ref 36–46)
Hemoglobin: 11.3 — AB (ref 12.0–16.0)
Platelets: 295 (ref 150–399)
WBC: 5.6

## 2017-04-29 LAB — IRON,TIBC AND FERRITIN PANEL
%SAT: 24
Ferritin: 83.5
Iron: 71

## 2017-04-29 LAB — HEPATIC FUNCTION PANEL
ALT: 30 (ref 7–35)
AST: 23 (ref 13–35)
Alkaline Phosphatase: 75 (ref 25–125)

## 2017-04-29 LAB — BASIC METABOLIC PANEL
Creatinine: 0.6 (ref 0.5–1.1)
Glucose: 174
Potassium: 4.2 (ref 3.4–5.3)
Sodium: 139 (ref 137–147)

## 2017-04-29 LAB — HEMOGLOBIN A1C: Hemoglobin A1C: 6.9

## 2017-05-05 ENCOUNTER — Ambulatory Visit: Payer: 59 | Admitting: Family Medicine

## 2017-05-05 ENCOUNTER — Encounter: Payer: Self-pay | Admitting: Family Medicine

## 2017-05-05 VITALS — BP 139/81 | HR 92 | Ht 66.0 in | Wt 224.0 lb

## 2017-05-05 DIAGNOSIS — G44319 Acute post-traumatic headache, not intractable: Secondary | ICD-10-CM | POA: Diagnosis not present

## 2017-05-05 DIAGNOSIS — Z23 Encounter for immunization: Secondary | ICD-10-CM

## 2017-05-05 DIAGNOSIS — I10 Essential (primary) hypertension: Secondary | ICD-10-CM | POA: Diagnosis not present

## 2017-05-05 DIAGNOSIS — E119 Type 2 diabetes mellitus without complications: Secondary | ICD-10-CM

## 2017-05-05 LAB — POCT GLYCOSYLATED HEMOGLOBIN (HGB A1C): Hemoglobin A1C: 6.8

## 2017-05-05 MED ORDER — LISINOPRIL 20 MG PO TABS: ORAL_TABLET | ORAL | 2 refills | Status: DC

## 2017-05-05 NOTE — Addendum Note (Signed)
Addended by: Teddy Spike on: 05/05/2017 09:23 AM   Modules accepted: Orders
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
## 2017-05-05 NOTE — Patient Instructions (Signed)
Please check with your insurance to see if they will pay for either Martha Woods,  Invokana, Steglatro.

## 2017-05-15 ENCOUNTER — Encounter: Payer: Self-pay | Admitting: Family Medicine

## 2017-05-18 ENCOUNTER — Telehealth: Payer: Self-pay | Admitting: Family Medicine

## 2017-05-19 NOTE — Telephone Encounter (Signed)
Opened in error

## 2017-06-01 LAB — HM DIABETES EYE EXAM

## 2017-06-02 ENCOUNTER — Encounter: Payer: Self-pay | Admitting: Family Medicine

## 2017-06-02 ENCOUNTER — Ambulatory Visit: Payer: 59 | Admitting: Family Medicine

## 2017-06-02 ENCOUNTER — Ambulatory Visit (INDEPENDENT_AMBULATORY_CARE_PROVIDER_SITE_OTHER): Payer: 59

## 2017-06-02 VITALS — BP 115/76 | HR 94 | Wt 220.0 lb

## 2017-06-02 DIAGNOSIS — M79641 Pain in right hand: Secondary | ICD-10-CM

## 2017-06-02 MED ORDER — DICLOFENAC SODIUM 1 % TD GEL: 2.0000 g | Freq: Four times a day (QID) | TRANSDERMAL | 11 refills | Status: DC

## 2017-06-02 NOTE — Patient Instructions (Signed)
Thank you for coming in today. Trial of topical diclofenac gel.  Try ice and rest.  If not better let me know If worsening return asap.   I will send a copy to Dr Ennis Forts at [facility]   Tendinitis Tendinitis is inflammation of a tendon. A tendon is a strong cord of tissue that connects muscle to bone. Tendinitis can affect any tendon, but it most commonly affects the shoulder tendon (rotator cuff), ankle tendon (Achilles tendon), elbow tendon (triceps tendon), or one of the tendons in the wrist. What are the causes? This condition may be caused by:  Overusing a tendon or muscle. This is common.  Age-related wear and tear.  Injury.  Inflammatory conditions, such as arthritis.  Certain medicines.  What increases the risk? This condition is more likely to develop in people who do activities that involve repetitive motions. What are the signs or symptoms? Symptoms of this condition may include:  Pain.  Tenderness.  Mild swelling.  How is this diagnosed? This condition is diagnosed with a physical exam. You may also have tests, such as:  Ultrasound. This uses sound waves to make an image of your affected area.  MRI.  How is this treated? This condition may be treated by resting, icing, applying pressure (compression), and raising (elevating) the area above the level of your heart. This is known as RICE therapy. Treatment may also include:  Medicines to help reduce inflammation or to help reduce pain.  Exercises or physical therapy to strengthen and stretch the tendon.  A brace or splint.  Surgery (rare).  Follow these instructions at home:  If you have a splint or brace:  Wear the splint or brace as told by your health care provider. Remove it only as told by your health care provider.  Loosen the splint or brace if your fingers or toes tingle, become numb, or turn cold and blue.  Do not take baths, swim, or use a hot tub until your health care provider  approves. Ask your health care provider if you can take showers. You may only be allowed to take sponge baths for bathing.  Do not let your splint or brace get wet if it is not waterproof. ? If your splint or brace is not waterproof, cover it with a watertight plastic bag when you take a bath or a shower.  Keep the splint or brace clean. Managing pain, stiffness, and swelling  If directed, apply ice to the affected area. ? Put ice in a plastic bag. ? Place a towel between your skin and the bag. ? Leave the ice on for 20 minutes, 2-3 times a day.  If directed, apply heat to the affected area as often as told by your health care provider. Use the heat source that your health care provider recommends, such as a moist heat pack or a heating pad. ? Place a towel between your skin and the heat source. ? Leave the heat on for 20-30 minutes. ? Remove the heat if your skin turns bright red. This is especially important if you are unable to feel pain, heat, or cold. You may have a greater risk of getting burned.  Move the fingers or toes of the affected limb often, if this applies. This can help to prevent stiffness and lessen swelling.  If directed, elevate the affected area above the level of your heart while you are sitting or lying down. Driving  Do not drive or operate heavy machinery while taking prescription pain  medicine.  Ask your health care provider when it is safe to drive if you have a splint or brace on any part of your arm or leg. Activity  Return to your normal activities as told by your health care provider. Ask your health care provider what activities are safe for you.  Rest the affected area as told by your health care provider.  Avoid using the affected area while you are experiencing symptoms of tendinitis.  Do exercises as told by your health care provider. General instructions  If you have a splint, do not put pressure on any part of the splint until it is fully  hardened. This may take several hours.  Wear an elastic bandage or compression wrap only as told by your health care provider.  Take over-the-counter and prescription medicines only as told by your health care provider.  Keep all follow-up visits as told by your health care provider. This is important. Contact a health care provider if:  Your symptoms do not improve.  You develop new, unexplained problems, such as numbness in your hands. This information is not intended to replace advice given to you by your health care provider. Make sure you discuss any questions you have with your health care provider. Document Released: 12/28/1999 Document Revised: 08/30/2015 Document Reviewed: 10/02/2014 Elsevier Interactive Patient Education  [employer].

## 2017-06-02 NOTE — Progress Notes (Signed)
Martha Woods is a 57 y.o. female who presents to [facility] today for right hand pain.   Tata notes a several day history of dorsal right hand pain. She denies any injury, fever, or chills. She notes that she is about 3 weeks s/p right total knee replacement performed by Dr Ennis Forts at [place]. She has been using a cane since transitioning away from the walker. However she typically uses the cane in the left hand. She denies any recent phlebotomy or IV in the right dorsal hand. She also denies any fever, chills, NVD. She notes mild to moderate swelling and pain at the dorsal hand. She notes some pain with finger flexion and resisted finger extension. She denies any specific treatment yet. She has used some Ibuprofen for pain which does help some. Symptoms are moderate.     ROS:  As above  Exam:  BP 115/76   Pulse 94   Wt 220 lb (99.8 kg)   LMP 12/26/2011   BMI 35.51 kg/m  General: Well Developed, well nourished, and in no acute distress.  Neuro/Psych: Alert and oriented x3, extra-ocular muscles intact, able to move all 4 extremities, sensation grossly intact. Skin: Warm and dry, no rashes noted.  Respiratory: Not using accessory muscles, speaking in full sentences, trachea midline.  Cardiovascular: Pulses palpable, no extremity edema. Abdomen: Does not appear distended. MSK:  Right hand. Mild swelling dorsal hand. No erythremia or induration. Non expressible pus or fluctuance.  Mild TTP dorsal hand overlying the 4th dorsal wrist compartment. Wrist motion is normal.  Finger flexion is normal but mildly painful.  Resisted finger extension is painful but strength is intact.  Pulses, cap refill and sensation are intact.   Limited MSK [place] right hand:  Normal appearing soft tissue and bony structures except 4th dorsal wrist compartment. There is hypoechoic fluid tracking along the superficial portion of the extensor tendons  within the tendon sheath.  This is tender to palpation with US probe and is the area of maximum tenderness.  Tendons are intact.  Impression: Tendosynovitis 4th dorsal wrist tendons.   Xray hand right my personal impression of images: No acute fractures.  Mild bossing at Carpal-Metacarpal joint.   Slightly wide Scapholunate space.  Otherwise normal appearing.  Awaiting formal radiology review.     No results found for this or any previous visit (from the past 48 hour(s)). No results found.   Assessment and Plan: 57 y.o. female with Right dorsal hand pain.  Exam and [place] are consistent with extensor tendosynovitis. However the cause is unclear. She has no trauma to this area. She has been using her hands more recently due to her right TKR recently.  Plan for watchful waiting, and treatment with diclofenac gel. If not better recheck soon. Next step is injection., however I would like to avoid steroids as this will worsen diabetes and may interfere with healing from TKR.  Recheck soon if not better.   CC: Hali Marry, MD And Dr Ennis Forts at [place] This Encounter  Procedures  . DG Hand Complete Right    Standing Status:   Future    Number of Occurrences:   1    Standing Expiration Date:   08/03/2018    Order Specific Question:   Reason for Exam (SYMPTOM  OR DIAGNOSIS REQUIRED)    Answer:   pain dorsal hand overlying 4th dorsal wrist comp. Eval DJD, suspect extensor tendonitis    Order Specific Question:  Is patient pregnant?    Answer:   No    Order Specific Question:   Preferred imaging location?    Answer:   [facility]    Order Specific Question:   Radiology Contrast Protocol - do NOT remove file path    Answer:   [email or web address]   Meds ordered this encounter  Medications  . diclofenac sodium (VOLTAREN) 1 % GEL    Sig: Apply 2 g topically 4 (four) times daily. To affected joint.    Dispense:   100 g    Refill:  11    Patient tried an failed celebrex and cannot take high doses of oral NSAIDs    Historical information moved to improve visibility of documentation.  Past Medical History:  Diagnosis Date  . Abnormal Pap smear    cervical cryo  . Cancer [facility]) 02-2009   papillary thyroid cancer, Dr. Malachy Mood  . Cervical spondylosis   . Diabetes mellitus   . Hyperlipidemia   . Hypertension   . Lumbar disc herniation   . Lumbar spondylosis   . Seborrheic dermatitis    Past Surgical History:  Procedure Laterality Date  . ett     neg for ischemia -7 mets pelvic u/s normal  . plantar fascitis  01-2010   L (Dr. Wardell Honour)  . TOTAL THYROIDECTOMY  02-2009   thyroid cancer   Social History   Tobacco Use  . Smoking status: Never Smoker  . Smokeless tobacco: Never Used  Substance Use Topics  . Alcohol use: Yes   family history includes Cancer in her maternal grandmother; Diabetes in her brother and mother; Hypertension in her brother.  Medications: Current Outpatient Medications  Medication Sig Dispense Refill  . aspirin 81 MG tablet Take 81 mg by mouth daily.      . Cholecalciferol (VITAMIN D) 2000 UNITS tablet Take 2,000 Units by mouth.    Marland Kitchen JANUMET 50-1000 MG tablet TAKE 1 TABLET BY MOUTH TWO  TIMES DAILY WITH MEALS 180 tablet 3  . lamoTRIgine (LAMICTAL) 100 MG tablet Take 1 tablet (100 mg total) by mouth 2 (two) times daily. 60 tablet 0  . levothyroxine (SYNTHROID, LEVOTHROID) 175 MCG tablet     . lisinopril (PRINIVIL,ZESTRIL) 20 MG tablet TAKE 1 TABLET BY MOUTH ONCE DAILY 90 tablet 2  . lovastatin (MEVACOR) 20 MG tablet TAKE 1 TABLET BY MOUTH AT  BEDTIME 90 tablet 3  . orphenadrine (NORFLEX) 100 MG tablet Take 100 mg by mouth 2 (two) times daily.    . rizatriptan (MAXALT-MLT) 10 MG disintegrating tablet Take 10 mg by mouth as needed. May repeat in 2 hours if needed     . traMADol (ULTRAM) 50 MG tablet Take 1 tablet (50 mg total) by mouth every 6 (six) hours as  needed. 90 tablet 0  . Travoprost (TRAVATAN OP) Apply 1 drop to eye.    . diclofenac sodium (VOLTAREN) 1 % GEL Apply 2 g topically 4 (four) times daily. To affected joint. 100 g 11   No current facility-administered medications for this visit.    Allergies  Allergen Reactions  . Cyclobenzaprine Other (See Comments)    Other reaction(s): Other "Made me sleep all the time." "Made me sleep all the time."   . Glipizide Other (See Comments)    Hypoglycemia  . Phentermine Other (See Comments)    palpitations  . Topamax [Topiramate] Other (See Comments)    Mood change, depression       Discussed warning signs or symptoms. Please  see discharge instructions. Patient expresses understanding.

## 2017-06-10 ENCOUNTER — Encounter: Payer: Self-pay | Admitting: Family Medicine

## 2017-08-04 ENCOUNTER — Encounter: Payer: Self-pay | Admitting: Family Medicine

## 2017-08-04 ENCOUNTER — Ambulatory Visit: Payer: 59 | Admitting: Family Medicine

## 2017-08-04 VITALS — BP 132/78 | HR 88 | Ht 66.0 in | Wt 223.0 lb

## 2017-08-04 DIAGNOSIS — Z96651 Presence of right artificial knee joint: Secondary | ICD-10-CM | POA: Diagnosis not present

## 2017-08-04 DIAGNOSIS — I10 Essential (primary) hypertension: Secondary | ICD-10-CM | POA: Diagnosis not present

## 2017-08-04 DIAGNOSIS — E119 Type 2 diabetes mellitus without complications: Secondary | ICD-10-CM | POA: Diagnosis not present

## 2017-08-04 DIAGNOSIS — G43909 Migraine, unspecified, not intractable, without status migrainosus: Secondary | ICD-10-CM

## 2017-08-04 LAB — POCT GLYCOSYLATED HEMOGLOBIN (HGB A1C): Hemoglobin A1C: 6.5 % — AB (ref 4.0–5.6)

## 2017-08-04 NOTE — Progress Notes (Signed)
Subjective:    CC: DM  HPI:  Diabetes - no hypoglycemic events. No wounds or sores that are not healing well. No increased thirst or urination. Checking glucose at home. 130-150.  Taking medications as prescribed without any side effects.  On Janumet.   Lab Results  Component Value Date   HGBA1C 6.5 (A) 08/04/2017    Hypertension- Pt denies chest pain, SOB, dizziness, or heart palpitations.  Taking meds as directed w/o problems.  Denies medication side effects.    Migraine headaches-overall she is doing well.  She will occasionally have a more severe breakthrough headache and uses her tramadol for that that does seem to work well.  He is also on Lamictal for prophylaxis.  She is doing well status post right knee replacement and in fact is considering going ahead and getting her left done in October.  She still has occasionally use ibuprofen.  Driving does seem to aggravate it.  Past medical history, Surgical history, Family history not pertinant except as noted below, Social history, Allergies, and medications have been entered into the medical record, reviewed, and corrections made.   Review of Systems: No fevers, chills, night sweats, weight loss, chest pain, or shortness of breath.   Objective:    General: Well Developed, well nourished, and in no acute distress.  Neuro: Alert and oriented x3, extra-ocular muscles intact, sensation grossly intact.  HEENT: Normocephalic, atraumatic  Skin: Warm and dry, no rashes. Cardiac: Regular rate and rhythm, no murmurs rubs or gallops, no lower extremity edema.  Respiratory: Clear to auscultation bilaterally. Not using accessory muscles, speaking in full sentences.   Impression and Recommendations:    DM - On ACEl, statin and ASA.  A1c looks fantastic today at 6.5.  Continue current regimen and follow-up in 4 months.  Labs are up-to-date.  HTN -well-controlled.  Continue to monitor carefully.  Status post right knee replacement-doing well  overall.  She will use of ibuprofen.  Migraine headaches-overall well controlled.  On Lamictal and using tramadol as needed for breakthrough.

## 2017-11-16 ENCOUNTER — Other Ambulatory Visit: Payer: Self-pay | Admitting: Family Medicine

## 2017-12-03 ENCOUNTER — Other Ambulatory Visit: Payer: Self-pay | Admitting: Family Medicine

## 2017-12-03 DIAGNOSIS — I10 Essential (primary) hypertension: Secondary | ICD-10-CM

## 2017-12-08 ENCOUNTER — Ambulatory Visit: Payer: 59 | Admitting: Family Medicine

## 2017-12-14 ENCOUNTER — Encounter: Payer: Self-pay | Admitting: Family Medicine

## 2017-12-14 ENCOUNTER — Ambulatory Visit: Payer: 59 | Admitting: Family Medicine

## 2017-12-14 VITALS — BP 130/80 | HR 93 | Ht 63.09 in | Wt 229.0 lb

## 2017-12-14 DIAGNOSIS — I10 Essential (primary) hypertension: Secondary | ICD-10-CM | POA: Diagnosis not present

## 2017-12-14 DIAGNOSIS — Z23 Encounter for immunization: Secondary | ICD-10-CM

## 2017-12-14 DIAGNOSIS — G43909 Migraine, unspecified, not intractable, without status migrainosus: Secondary | ICD-10-CM

## 2017-12-14 DIAGNOSIS — E119 Type 2 diabetes mellitus without complications: Secondary | ICD-10-CM

## 2017-12-14 LAB — POCT GLYCOSYLATED HEMOGLOBIN (HGB A1C): Hemoglobin A1C: 7.3 % — AB (ref 4.0–5.6)

## 2017-12-14 NOTE — Progress Notes (Signed)
Subjective:    CC:   HPI: Diabetes - no hypoglycemic events. No wounds or sores that are not healing well. No increased thirst or urination. Checking glucose at home. Taking medications as prescribed without any side effects. pt has recently had L knee surgery and has received steroid injections   Hypertension- Pt denies chest pain, SOB, dizziness, or heart palpitations.  Taking meds as directed w/o problems.  Denies medication side effects.    Did have left knee replacement and overall is doing okay.  She is been engaged in physical therapy but still having a fair amount of pain and discomfort.  She also has limited flexion and has been working on that as well.  Migraines are actually doing really well.  She follows with neurology she is been on a regimen of Lamictal and Norflex.  She says she is only ever had to take her Maxalt twice.  She is following up with them in the next month and is hoping to be able to come off some of her medication.  BP 130/80   Pulse 93   Ht 5' 3.09" (1.602 m)   Wt 229 lb (103.9 kg)   LMP 12/26/2011   SpO2 100%   BMI 40.45 kg/m     Allergies  Allergen Reactions  . Cyclobenzaprine Other (See Comments)    Other reaction(s): Other "Made me sleep all the time." "Made me sleep all the time."   . Glipizide Other (See Comments)    Hypoglycemia  . Phentermine Other (See Comments)    palpitations  . Topamax [Topiramate] Other (See Comments)    Mood change, depression     Past Medical History:  Diagnosis Date  . Abnormal Pap smear    cervical cryo  . Cancer [facility]) 02-2009   papillary thyroid cancer, Dr. Malachy Mood  . Cervical spondylosis   . Diabetes mellitus   . Hyperlipidemia   . Hypertension   . Lumbar disc herniation   . Lumbar spondylosis   . Seborrheic dermatitis     Past Surgical History:  Procedure Laterality Date  . ett     neg for ischemia -7 mets pelvic u/s normal  . plantar fascitis  01-2010   L (Dr. Wardell Honour)  . REPLACEMENT  TOTAL KNEE Right 2019  . REPLACEMENT TOTAL KNEE Left 11/42019   Dr. Tacey Ruiz  . TOTAL THYROIDECTOMY  02-2009   thyroid cancer    Social History   Socioeconomic History  . Marital status: Married    Spouse name: Not on file  . Number of children: Not on file  . Years of education: Not on file  . Highest education level: Not on file  Occupational History  . Not on file  Social Needs  . Financial resource strain: Not on file  . Food insecurity:    Worry: Not on file    Inability: Not on file  . Transportation needs:    Medical: Not on file    Non-medical: Not on file  Tobacco Use  . Smoking status: Never Smoker  . Smokeless tobacco: Never Used  Substance and Sexual Activity  . Alcohol use: Yes  . Drug use: No  . Sexual activity: Not on file  Lifestyle  . Physical activity:    Days per week: Not on file    Minutes per session: Not on file  . Stress: Not on file  Relationships  . Social connections:    Talks on phone: Not on file    Gets together:  Not on file    Attends religious service: Not on file    Active member of club or organization: Not on file    Attends meetings of clubs or organizations: Not on file    Relationship status: Not on file  . Intimate partner violence:    Fear of current or ex partner: Not on file    Emotionally abused: Not on file    Physically abused: Not on file    Forced sexual activity: Not on file  Other Topics Concern  . Not on file  Social History Narrative  . Not on file    Family History  Problem Relation Age of Onset  . Diabetes Mother   . Diabetes Brother   . Hypertension Brother   . Cancer Maternal Grandmother        ovarian cancer    Outpatient Encounter Medications as of 12/14/2017  Medication Sig  . aspirin 81 MG tablet Take 81 mg by mouth daily.    . Cholecalciferol (VITAMIN D) 2000 UNITS tablet Take 2,000 Units by mouth.  Marland Kitchen JANUMET 50-1000 MG tablet TAKE 1 TABLET BY MOUTH TWO  TIMES DAILY WITH MEALS  .  lamoTRIgine (LAMICTAL) 100 MG tablet Take 1 tablet (100 mg total) by mouth 2 (two) times daily.  Marland Kitchen levothyroxine (SYNTHROID, LEVOTHROID) 175 MCG tablet   . lisinopril (PRINIVIL,ZESTRIL) 20 MG tablet TAKE 1 TABLET BY MOUTH ONCE DAILY  . lovastatin (MEVACOR) 20 MG tablet TAKE 1 TABLET BY MOUTH AT  BEDTIME  . orphenadrine (NORFLEX) 100 MG tablet Take 100 mg by mouth 2 (two) times daily.  . rizatriptan (MAXALT-MLT) 10 MG disintegrating tablet Take 10 mg by mouth as needed. May repeat in 2 hours if needed   . traMADol (ULTRAM) 50 MG tablet Take 1 tablet (50 mg total) by mouth every 6 (six) hours as needed.  . Travoprost (TRAVATAN OP) Apply 1 drop to eye.  . [DISCONTINUED] diclofenac sodium (VOLTAREN) 1 % GEL Apply 2 g topically 4 (four) times daily. To affected joint.   No facility-administered encounter medications on file as of 12/14/2017.        Objective:    General: Well Developed, well nourished, and in no acute distress.  Neuro: Alert and oriented x3, extra-ocular muscles intact, sensation grossly intact.  HEENT: Normocephalic, atraumatic  Skin: Warm and dry, no rashes. Cardiac: Regular rate and rhythm, no murmurs rubs or gallops, no lower extremity edema.  Respiratory: Clear to auscultation bilaterally. Not using accessory muscles, speaking in full sentences. MSK: only able to flex left knee to about 95 degree.    Impression and Recommendations:    HTN - Well controlled. Continue current regimen. Follow up in  6 months.  Labs ordered.    DM -uncontrolled.  Hemoglobin A1c up to 7.3.  Discussed options.  She really feels like she can work on her diet and now that she is had her knee replacement and she is getting more mobile that should help with her exercise.  Her last A1c in July was fantastic at 6.7 so I know she can make some changes and get back to her previous levels.  She agreed to follow-up in 3 months and then we can make a decision then if we need to adjust her  regimen.  Migraine headaches-overall really well controlled.  Hoping to get off of medication when she follows up with her neurologist.  She does have some biometric forms that she would like to drop off work.  Shingles vaccine  given today.

## 2017-12-23 ENCOUNTER — Other Ambulatory Visit: Payer: Self-pay | Admitting: *Deleted

## 2017-12-23 DIAGNOSIS — D649 Anemia, unspecified: Secondary | ICD-10-CM

## 2017-12-24 LAB — IRON: Iron: 63 ug/dL (ref 45–160)

## 2017-12-24 LAB — COMPLETE METABOLIC PANEL WITH GFR
AG Ratio: 1.4 (calc) (ref 1.0–2.5)
ALT: 12 U/L (ref 6–29)
AST: 16 U/L (ref 10–35)
Albumin: 4.1 g/dL (ref 3.6–5.1)
Alkaline phosphatase (APISO): 68 U/L (ref 33–130)
BUN: 21 mg/dL (ref 7–25)
CO2: 27 mmol/L (ref 20–32)
Calcium: 8.9 mg/dL (ref 8.6–10.4)
Chloride: 102 mmol/L (ref 98–110)
Creat: 0.8 mg/dL (ref 0.50–1.05)
GFR, Est African American: 94 mL/min/{1.73_m2} (ref >=60)
GFR, Est Non African American: 81 mL/min/{1.73_m2} (ref >=60)
Globulin: 3 g/dL (calc) (ref 1.9–3.7)
Glucose, Bld: 131 mg/dL — ABNORMAL HIGH (ref 65–99)
Potassium: 4.2 mmol/L (ref 3.5–5.3)
Sodium: 137 mmol/L (ref 135–146)
Total Bilirubin: 0.4 mg/dL (ref 0.2–1.2)
Total Protein: 7.1 g/dL (ref 6.1–8.1)

## 2017-12-24 LAB — CBC
HCT: 33.5 % — ABNORMAL LOW (ref 35.0–45.0)
Hemoglobin: 10.1 g/dL — ABNORMAL LOW (ref 11.7–15.5)
MCH: 21.8 pg — ABNORMAL LOW (ref 27.0–33.0)
MCHC: 30.1 g/dL — ABNORMAL LOW (ref 32.0–36.0)
MCV: 72.2 fL — ABNORMAL LOW (ref 80.0–100.0)
MPV: 10.7 fL (ref 7.5–12.5)
Platelets: 386 10*3/uL (ref 140–400)
RBC: 4.64 10*6/uL (ref 3.80–5.10)
RDW: 14.8 % (ref 11.0–15.0)
WBC: 4.8 10*3/uL (ref 3.8–10.8)

## 2017-12-24 LAB — LIPID PANEL
Cholesterol: 153 mg/dL (ref <200)
HDL: 56 mg/dL (ref >50)
LDL Cholesterol (Calc): 83 mg/dL (calc)
Non-HDL Cholesterol (Calc): 97 mg/dL (calc) (ref <130)
Total CHOL/HDL Ratio: 2.7 (calc) (ref <5.0)
Triglycerides: 65 mg/dL (ref <150)

## 2017-12-24 LAB — FERRITIN: Ferritin: 55 ng/mL (ref 16–232)

## 2018-03-16 ENCOUNTER — Ambulatory Visit: Payer: 59 | Admitting: Family Medicine

## 2018-03-29 ENCOUNTER — Ambulatory Visit: Payer: 59 | Admitting: Family Medicine

## 2018-04-19 ENCOUNTER — Ambulatory Visit (INDEPENDENT_AMBULATORY_CARE_PROVIDER_SITE_OTHER): Payer: Self-pay | Admitting: Family Medicine

## 2018-04-19 ENCOUNTER — Encounter: Payer: Self-pay | Admitting: Family Medicine

## 2018-04-19 VITALS — BP 128/86 | HR 88 | Temp 97.1°F | Resp 17 | Wt 221.0 lb

## 2018-04-19 DIAGNOSIS — I10 Essential (primary) hypertension: Secondary | ICD-10-CM

## 2018-04-19 DIAGNOSIS — E890 Postprocedural hypothyroidism: Secondary | ICD-10-CM

## 2018-04-19 DIAGNOSIS — M4697 Unspecified inflammatory spondylopathy, lumbosacral region: Secondary | ICD-10-CM

## 2018-04-19 DIAGNOSIS — E119 Type 2 diabetes mellitus without complications: Secondary | ICD-10-CM

## 2018-04-19 NOTE — Progress Notes (Signed)
Virtual Visit via Telephone Note  I connected with Martha Woods on 04/19/18 at  7:10 AM EDT by telephone and verified that I am speaking with the correct person using two identifiers.   I discussed the limitations, risks, security and privacy concerns of performing an evaluation and management service by telephone and the availability of in person appointments. I also discussed with the patient that there may be a patient responsible charge related to this service. The patient expressed understanding and agreed to proceed.   Subjective:    CC: DM f/u  HPI:  Diabetes -she did have 1 or 2 hypoglycemic events while at work.  She is pretty sure her sugar dropped low.  She ate something and then felt better.. No wounds or sores that are not healing well. No increased thirst or urination. Checking glucose at home. Taking medications as prescribed without any side effects. CBG- 117. Had a low while at work an ate at work.  She did have a blood sugar in the 150s a few days ago but says it was because of what she ate.  Hypertension- Pt denies chest pain, SOB, dizziness, or heart palpitations.  Taking meds as directed w/o problems.  Denies medication side effects.    Follow-up inflammatory spondylopathy of the lumbar sacral region-overall she is actually doing really well she just has a lot of problems when she is doing a lot of bending.  Hypothyroid - last TSH was  Normal with Dr. Mare Ferrari.  Taking her medication regularly.  Past medical history, Surgical history, Family history not pertinant except as noted below, Social history, Allergies, and medications have been entered into the medical record, reviewed, and corrections made.   Review of Systems: No fevers, chills, night sweats, weight loss, chest pain, or shortness of breath.   Objective:    General: Speaking clearly in complete sentences without any shortness of breath.  Alert and oriented x3.  Normal judgment. No apparent acute  distress.   Impression and Recommendations:   DM - based on home blood sugars. She is due for A1C. Last one was a little bit elevated so just encouraged her to continue to work on healthy diet good food choices and regular exercise.  Hopefully in the next couple months we can get an up-to-date A1c.  HTN - asymptomatic.  Pressure today at home looks absolutely fantastic.  F/U inflammatory spondylopathy - Overall does OK except when she is having to do a lot of bending.  Hypothyroidism status post surgical removal after papillary thyroid carcinoma.- due for more blood work with Dr. Mare Ferrari but their office if closed.       Due for 2nd Shingrix by June 2nd.  We discussed maybe waiting till the end of the month to see if they lift restrictions and if not then we can do a drive-by shingles vaccine and get it scheduled.    I discussed the assessment and treatment plan with the patient. The patient was provided an opportunity to ask questions and all were answered. The patient agreed with the plan and demonstrated an understanding of the instructions.   The patient was advised to call back or seek an in-person evaluation if the symptoms worsen or if the condition fails to improve as anticipated.    Beatrice Lecher, MD

## 2018-07-21 ENCOUNTER — Encounter: Payer: Self-pay | Admitting: Family Medicine

## 2018-07-21 ENCOUNTER — Ambulatory Visit (INDEPENDENT_AMBULATORY_CARE_PROVIDER_SITE_OTHER): Payer: 59 | Admitting: Family Medicine

## 2018-07-21 ENCOUNTER — Other Ambulatory Visit: Payer: Self-pay

## 2018-07-21 VITALS — BP 129/72 | HR 86 | Ht 63.0 in | Wt 226.0 lb

## 2018-07-21 DIAGNOSIS — Z23 Encounter for immunization: Secondary | ICD-10-CM | POA: Diagnosis not present

## 2018-07-21 DIAGNOSIS — I10 Essential (primary) hypertension: Secondary | ICD-10-CM

## 2018-07-21 DIAGNOSIS — M7741 Metatarsalgia, right foot: Secondary | ICD-10-CM

## 2018-07-21 DIAGNOSIS — E119 Type 2 diabetes mellitus without complications: Secondary | ICD-10-CM

## 2018-07-21 DIAGNOSIS — D649 Anemia, unspecified: Secondary | ICD-10-CM

## 2018-07-21 DIAGNOSIS — D569 Thalassemia, unspecified: Secondary | ICD-10-CM | POA: Diagnosis not present

## 2018-07-21 DIAGNOSIS — M5416 Radiculopathy, lumbar region: Secondary | ICD-10-CM | POA: Insufficient documentation

## 2018-07-21 LAB — POCT GLYCOSYLATED HEMOGLOBIN (HGB A1C): Hemoglobin A1C: 6.7 % — AB (ref 4.0–5.6)

## 2018-07-21 MED ORDER — LISINOPRIL 20 MG PO TABS: 20.0000 mg | ORAL_TABLET | Freq: Every day | ORAL | 3 refills | Status: DC

## 2018-07-21 MED ORDER — PREDNISONE 20 MG PO TABS: 40.0000 mg | ORAL_TABLET | Freq: Every day | ORAL | 0 refills | Status: DC

## 2018-07-21 MED ORDER — LOVASTATIN 20 MG PO TABS: 20.0000 mg | ORAL_TABLET | Freq: Every day | ORAL | 3 refills | Status: DC

## 2018-07-21 MED ORDER — JANUMET 50-1000 MG PO TABS: ORAL_TABLET | ORAL | 3 refills | Status: DC

## 2018-07-21 MED ORDER — RIZATRIPTAN BENZOATE 10 MG PO TBDP: 10.0000 mg | ORAL_TABLET | ORAL | 4 refills | Status: DC | PRN

## 2018-07-21 NOTE — Progress Notes (Signed)
Established Patient Office Visit  Subjective:  Patient ID: Martha Woods, female    DOB: 08/15/1960  Age: 58 y.o. MRN: [id number]  CC:  Chief Complaint  Patient presents with  . Diabetes    HPI Martha Woods presents for   Diabetes - no hypoglycemic events. No wounds or sores that are not healing well. No increased thirst or urination. Checking glucose at home. Taking medications as prescribed without any side effects.  Hypertension- Pt denies chest pain, SOB, dizziness, or heart palpitations.  Taking meds as directed w/o problems.  Denies medication side effects.    F/U thalessemia - she had had some low ferritin levels in th epast.  Is been trending upward.  Last check was 7 months ago so would like to do that again.  She also c/o of pain in the ball of the foot for about 4 months.  Says worse when she walks. Better at rest.  No injury or trauma.  She does request custom orthotics in her shoes.  Complains of right low back pain that radiates around into her groin and then into her inner thigh on the right leg.  That has been going on for about a week.  She has a history of some degenerative disc disease in her back.  She denies any injury or trauma.  Past Medical History:  Diagnosis Date  . Abnormal Pap smear    cervical cryo  . Cancer [facility]) 02-2009   papillary thyroid cancer, Dr. Malachy Mood  . Cervical spondylosis   . Diabetes mellitus   . Hyperlipidemia   . Hypertension   . Lumbar disc herniation   . Lumbar spondylosis   . Seborrheic dermatitis     Past Surgical History:  Procedure Laterality Date  . ett     neg for ischemia -7 mets pelvic u/s normal  . plantar fascitis  01-2010   L (Dr. Wardell Honour)  . REPLACEMENT TOTAL KNEE Right 2019  . REPLACEMENT TOTAL KNEE Left 11/42019   Dr. Tacey Ruiz  . TOTAL THYROIDECTOMY  02-2009   thyroid cancer    Family History  Problem Relation Age of Onset  . Diabetes Mother   . Diabetes Brother   .  Hypertension Brother   . Cancer Maternal Grandmother        ovarian cancer    Social History   Socioeconomic History  . Marital status: Married    Spouse name: Not on file  . Number of children: Not on file  . Years of education: Not on file  . Highest education level: Not on file  Occupational History  . Not on file  Social Needs  . Financial resource strain: Not on file  . Food insecurity    Worry: Not on file    Inability: Not on file  . Transportation needs    Medical: Not on file    Non-medical: Not on file  Tobacco Use  . Smoking status: Never Smoker  . Smokeless tobacco: Never Used  Substance and Sexual Activity  . Alcohol use: Yes  . Drug use: No  . Sexual activity: Not on file  Lifestyle  . Physical activity    Days per week: Not on file    Minutes per session: Not on file  . Stress: Not on file  Relationships  . Social Herbalist on phone: Not on file    Gets together: Not on file    Attends religious service: Not on file  Active member of club or organization: Not on file    Attends meetings of clubs or organizations: Not on file    Relationship status: Not on file  . Intimate partner violence    Fear of current or ex partner: Not on file    Emotionally abused: Not on file    Physically abused: Not on file    Forced sexual activity: Not on file  Other Topics Concern  . Not on file  Social History Narrative  . Not on file    Outpatient Medications Prior to Visit  Medication Sig Dispense Refill  . aspirin 81 MG tablet Take 81 mg by mouth daily.      . Cholecalciferol (VITAMIN D) 2000 UNITS tablet Take 2,000 Units by mouth.    . lamoTRIgine (LAMICTAL) 100 MG tablet Take 1 tablet (100 mg total) by mouth 2 (two) times daily. 60 tablet 0  . levothyroxine (SYNTHROID, LEVOTHROID) 175 MCG tablet     . orphenadrine (NORFLEX) 100 MG tablet Take 100 mg by mouth 2 (two) times daily.    . traMADol (ULTRAM) 50 MG tablet Take 1 tablet (50 mg total)  by mouth every 6 (six) hours as needed. 90 tablet 0  . Travoprost (TRAVATAN OP) Apply 1 drop to eye.    Marland Kitchen JANUMET 50-1000 MG tablet TAKE 1 TABLET BY MOUTH TWO  TIMES DAILY WITH MEALS 180 tablet 3  . lisinopril (PRINIVIL,ZESTRIL) 20 MG tablet TAKE 1 TABLET BY MOUTH ONCE DAILY 90 tablet 2  . lovastatin (MEVACOR) 20 MG tablet TAKE 1 TABLET BY MOUTH AT  BEDTIME 90 tablet 3  . rizatriptan (MAXALT-MLT) 10 MG disintegrating tablet Take 10 mg by mouth as needed. May repeat in 2 hours if needed      No facility-administered medications prior to visit.     Allergies  Allergen Reactions  . Cyclobenzaprine Other (See Comments)    Other reaction(s): Other "Made me sleep all the time." "Made me sleep all the time."   . Glipizide Other (See Comments)    Hypoglycemia  . Phentermine Other (See Comments)    palpitations  . Topamax [Topiramate] Other (See Comments)    Mood change, depression     ROS Review of Systems    Objective:    Physical Exam  Constitutional: She is oriented to person, place, and time. She appears well-developed and well-nourished.  HENT:  Head: Normocephalic and atraumatic.  Cardiovascular: Normal rate, regular rhythm and normal heart sounds.  Pulmonary/Chest: Effort normal and breath sounds normal.  Musculoskeletal:     Comments: Right foot with normal appearance overall though she does have some splaying of her toes.  Ankle with normal range of motion great toe with normal range of motion.  Tender at the distal heads of the metatarsals on the first second and third toes.  Dorsal pedal pulse 2+.  Nontender over the lumbar spine but she is tender over the right SI joint.  Hip, knee, ankle strength is 5-5 bilaterally.  Absent patellar reflexes bilaterally.  Neurological: She is alert and oriented to person, place, and time.  Skin: Skin is warm and dry.  Psychiatric: She has a normal mood and affect. Her behavior is normal.    BP 129/72   Pulse 86   Ht 5\' 3"  (1.6 m)    Wt 226 lb (102.5 kg)   LMP 12/26/2011   SpO2 100%   BMI 40.03 kg/m  Wt Readings from Last 3 Encounters:  07/21/18 226 lb (102.5 kg)  04/19/18 221 lb (100.2 kg)  12/14/17 229 lb (103.9 kg)     Health Maintenance Due  Topic Date Due  . HEMOGLOBIN A1C  06/15/2018  . PAP SMEAR-Modifier  07/24/2018    There are no preventive care reminders to display for this patient.  Lab Results  Component Value Date   TSH 4.013 09/19/2008   Lab Results  Component Value Date   WBC 4.8 12/22/2017   HGB 10.1 (L) 12/22/2017   HCT 33.5 (L) 12/22/2017   MCV 72.2 (L) 12/22/2017   PLT 386 12/22/2017   Lab Results  Component Value Date   NA 137 12/22/2017   K 4.2 12/22/2017   CO2 27 12/22/2017   GLUCOSE 131 (H) 12/22/2017   BUN 21 12/22/2017   CREATININE 0.80 12/22/2017   BILITOT 0.4 12/22/2017   ALKPHOS 75 04/29/2017   AST 16 12/22/2017   ALT 12 12/22/2017   PROT 7.1 12/22/2017   ALBUMIN 3.8 01/23/2015   CALCIUM 8.9 12/22/2017   Lab Results  Component Value Date   CHOL 153 12/22/2017   Lab Results  Component Value Date   HDL 56 12/22/2017   Lab Results  Component Value Date   LDLCALC 83 12/22/2017   Lab Results  Component Value Date   TRIG 65 12/22/2017   Lab Results  Component Value Date   CHOLHDL 2.7 12/22/2017   Lab Results  Component Value Date   HGBA1C 6.7 (A) 07/21/2018      Assessment & Plan:   Problem List Items Addressed This Visit      Cardiovascular and Mediastinum   HYPERTENSION, BENIGN SYSTEMIC   Relevant Medications   lisinopril (ZESTRIL) 20 MG tablet   lovastatin (MEVACOR) 20 MG tablet   Other Relevant Orders   BASIC METABOLIC PANEL WITH GFR     Endocrine   Type 2 diabetes mellitus without complication, without long-term current use of insulin (HCC) - Primary   Relevant Medications   sitaGLIPtin-metformin (JANUMET) 50-1000 MG tablet   lisinopril (ZESTRIL) 20 MG tablet   lovastatin (MEVACOR) 20 MG tablet   Other Relevant Orders   POCT  glycosylated hemoglobin (Hb A1C) (Completed)     Nervous and Auditory   Lumbar back pain with radiculopathy affecting right lower extremity   Relevant Medications   predniSONE (DELTASONE) 20 MG tablet     Other   Thalassemia    Other Visit Diagnoses    Low hemoglobin       Relevant Orders   B12   Ferritin   Hemoglobin   Need for Zostavax administration       Relevant Orders   Varicella-zoster vaccine IM (Shingrix) (Completed)   Metatarsalgia of right foot         Right foot pain-consistent with metatarsalgia-recommend a metatarsal pad.  I gave her a set of medium pads to place in her shoes and wear of the next couple of weeks.  She Artie does have some custom orthotics so if not improving then encouraged her to get back in with sports medicine for further work-up and evaluation.  Right low back pain radiating around to the right groin.  Suspect related to a disc issue.  Will give a trial of prednisone burst over the weekend to see if this improves.  She has had to have epidural injections in the past on the other side of her back.  So if not improving I would like her to get back in with sports medicine as well.  Meds ordered this  encounter  Medications  . sitaGLIPtin-metformin (JANUMET) 50-1000 MG tablet    Sig: TAKE 1 TABLET BY MOUTH TWO  TIMES DAILY WITH MEALS    Dispense:  180 tablet    Refill:  3  . lisinopril (ZESTRIL) 20 MG tablet    Sig: Take 1 tablet (20 mg total) by mouth daily.    Dispense:  90 tablet    Refill:  3  . rizatriptan (MAXALT-MLT) 10 MG disintegrating tablet    Sig: Take 1 tablet (10 mg total) by mouth as needed. May repeat in 2 hours if needed    Dispense:  30 tablet    Refill:  4  . lovastatin (MEVACOR) 20 MG tablet    Sig: Take 1 tablet (20 mg total) by mouth at bedtime.    Dispense:  90 tablet    Refill:  3  . predniSONE (DELTASONE) 20 MG tablet    Sig: Take 2 tablets (40 mg total) by mouth daily with breakfast.    Dispense:  10 tablet     Refill:  0    Follow-up: Return in about 4 months (around 11/21/2018) for Diabetes follow-up, Hypertension.    Beatrice Lecher, MD

## 2018-09-09 ENCOUNTER — Encounter: Payer: Self-pay | Admitting: Family Medicine

## 2018-09-09 ENCOUNTER — Other Ambulatory Visit: Payer: Self-pay

## 2018-09-09 ENCOUNTER — Ambulatory Visit (INDEPENDENT_AMBULATORY_CARE_PROVIDER_SITE_OTHER): Payer: 59 | Admitting: Family Medicine

## 2018-09-09 DIAGNOSIS — Z23 Encounter for immunization: Secondary | ICD-10-CM | POA: Diagnosis not present

## 2018-09-09 NOTE — Progress Notes (Signed)
Pt here for flu shot. Afebrile,no recent illness. Vaccination given, pt tolerated well..Omaya Nieland Lynetta, CMA  

## 2018-09-10 LAB — BASIC METABOLIC PANEL WITH GFR
BUN: 17 mg/dL (ref 7–25)
CO2: 30 mmol/L (ref 20–32)
Calcium: 8.8 mg/dL (ref 8.6–10.4)
Chloride: 102 mmol/L (ref 98–110)
Creat: 1.01 mg/dL (ref 0.50–1.05)
GFR, Est African American: 71 mL/min/{1.73_m2} (ref >=60)
GFR, Est Non African American: 61 mL/min/{1.73_m2} (ref >=60)
Glucose, Bld: 163 mg/dL — ABNORMAL HIGH (ref 65–99)
Potassium: 4.3 mmol/L (ref 3.5–5.3)
Sodium: 140 mmol/L (ref 135–146)

## 2018-09-10 LAB — VITAMIN B12: Vitamin B-12: 353 pg/mL (ref 200–1100)

## 2018-09-10 LAB — HEMOGLOBIN: Hemoglobin: 11.1 g/dL — ABNORMAL LOW (ref 11.7–15.5)

## 2018-09-10 LAB — FERRITIN: Ferritin: 35 ng/mL (ref 16–232)

## 2018-11-23 ENCOUNTER — Other Ambulatory Visit: Payer: Self-pay

## 2018-11-23 ENCOUNTER — Ambulatory Visit (INDEPENDENT_AMBULATORY_CARE_PROVIDER_SITE_OTHER): Payer: 59 | Admitting: Family Medicine

## 2018-11-23 ENCOUNTER — Encounter: Payer: Self-pay | Admitting: Family Medicine

## 2018-11-23 VITALS — BP 129/71 | HR 87 | Ht 63.0 in | Wt 228.0 lb

## 2018-11-23 DIAGNOSIS — Z1231 Encounter for screening mammogram for malignant neoplasm of breast: Secondary | ICD-10-CM

## 2018-11-23 DIAGNOSIS — E119 Type 2 diabetes mellitus without complications: Secondary | ICD-10-CM | POA: Diagnosis not present

## 2018-11-23 DIAGNOSIS — I10 Essential (primary) hypertension: Secondary | ICD-10-CM | POA: Diagnosis not present

## 2018-11-23 DIAGNOSIS — E611 Iron deficiency: Secondary | ICD-10-CM | POA: Diagnosis not present

## 2018-11-23 LAB — POCT GLYCOSYLATED HEMOGLOBIN (HGB A1C): Hemoglobin A1C: 6.5 % — AB (ref 4.0–5.6)

## 2018-11-23 NOTE — Assessment & Plan Note (Signed)
Well controlled. Continue current regimen. Follow up in  6 months.  

## 2018-11-23 NOTE — Progress Notes (Signed)
Established Patient Office Visit  Subjective:  Patient ID: Martha Woods, female    DOB: 01-27-1960  Age: 58 y.o. MRN: [id number]  CC:  Chief Complaint  Patient presents with  . Diabetes  . Hypertension    HPI Martha Woods presents for   Diabetes - no hypoglycemic events. No wounds or sores that are not healing well. No increased thirst or urination. Checking glucose at home. Taking medications as prescribed without any side effects.  Hypertension- Pt denies chest pain, SOB, dizziness, or heart palpitations.  Taking meds as directed w/o problems.  Denies medication side effects.    Low iron. - repeat iron 2 mo ago was borderline low at 35.  She has had very low iron 4 years ago.  She has started an over-the-counter daily iron supplement.   Past Medical History:  Diagnosis Date  . Abnormal Pap smear    cervical cryo  . Cancer [facility]) 02-2009   papillary thyroid cancer, Dr. Malachy Mood  . Cervical spondylosis   . Diabetes mellitus   . Hyperlipidemia   . Hypertension   . Lumbar disc herniation   . Lumbar spondylosis   . Seborrheic dermatitis     Past Surgical History:  Procedure Laterality Date  . ett     neg for ischemia -7 mets pelvic u/s normal  . plantar fascitis  01-2010   L (Dr. Wardell Honour)  . REPLACEMENT TOTAL KNEE Right 2019  . REPLACEMENT TOTAL KNEE Left 11/42019   Dr. Tacey Ruiz  . TOTAL THYROIDECTOMY  02-2009   thyroid cancer    Family History  Problem Relation Age of Onset  . Diabetes Mother   . Diabetes Brother   . Hypertension Brother   . Cancer Maternal Grandmother        ovarian cancer    Social History   Socioeconomic History  . Marital status: Married    Spouse name: Not on file  . Number of children: Not on file  . Years of education: Not on file  . Highest education level: Not on file  Occupational History  . Not on file  Social Needs  . Financial resource strain: Not on file  . Food insecurity    Worry: Not on  file    Inability: Not on file  . Transportation needs    Medical: Not on file    Non-medical: Not on file  Tobacco Use  . Smoking status: Never Smoker  . Smokeless tobacco: Never Used  Substance and Sexual Activity  . Alcohol use: Yes  . Drug use: No  . Sexual activity: Not on file  Lifestyle  . Physical activity    Days per week: Not on file    Minutes per session: Not on file  . Stress: Not on file  Relationships  . Social Herbalist on phone: Not on file    Gets together: Not on file    Attends religious service: Not on file    Active member of club or organization: Not on file    Attends meetings of clubs or organizations: Not on file    Relationship status: Not on file  . Intimate partner violence    Fear of current or ex partner: Not on file    Emotionally abused: Not on file    Physically abused: Not on file    Forced sexual activity: Not on file  Other Topics Concern  . Not on file  Social History Narrative  . Not on  file    Outpatient Medications Prior to Visit  Medication Sig Dispense Refill  . aspirin 81 MG tablet Take 81 mg by mouth daily.      . Cholecalciferol (VITAMIN D) 2000 UNITS tablet Take 2,000 Units by mouth.    . lamoTRIgine (LAMICTAL) 100 MG tablet Take 1 tablet (100 mg total) by mouth 2 (two) times daily. 60 tablet 0  . levothyroxine (SYNTHROID, LEVOTHROID) 175 MCG tablet     . lisinopril (ZESTRIL) 20 MG tablet Take 1 tablet (20 mg total) by mouth daily. 90 tablet 3  . lovastatin (MEVACOR) 20 MG tablet Take 1 tablet (20 mg total) by mouth at bedtime. 90 tablet 3  . orphenadrine (NORFLEX) 100 MG tablet Take 100 mg by mouth 2 (two) times daily.    . rizatriptan (MAXALT-MLT) 10 MG disintegrating tablet Take 1 tablet (10 mg total) by mouth as needed. May repeat in 2 hours if needed 30 tablet 4  . sitaGLIPtin-metformin (JANUMET) 50-1000 MG tablet TAKE 1 TABLET BY MOUTH TWO  TIMES DAILY WITH MEALS 180 tablet 3  . traMADol (ULTRAM) 50 MG  tablet Take 1 tablet (50 mg total) by mouth every 6 (six) hours as needed. 90 tablet 0  . Travoprost, BAK Free, (TRAVATAN) 0.004 % SOLN ophthalmic solution Place 1 drop into both eyes every evening.    . predniSONE (DELTASONE) 20 MG tablet Take 2 tablets (40 mg total) by mouth daily with breakfast. 10 tablet 0  . Travoprost (TRAVATAN OP) Apply 1 drop to eye.     No facility-administered medications prior to visit.     Allergies  Allergen Reactions  . Cyclobenzaprine Other (See Comments)    Other reaction(s): Other "Made me sleep all the time." "Made me sleep all the time."   . Glipizide Other (See Comments)    Hypoglycemia  . Phentermine Other (See Comments)    palpitations  . Topamax [Topiramate] Other (See Comments)    Mood change, depression     ROS Review of Systems    Objective:    Physical Exam  Constitutional: She is oriented to person, place, and time. She appears well-developed and well-nourished.  HENT:  Head: Normocephalic and atraumatic.  Cardiovascular: Normal rate, regular rhythm and normal heart sounds.  Pulmonary/Chest: Effort normal and breath sounds normal.  Neurological: She is alert and oriented to person, place, and time.  Skin: Skin is warm and dry.  Psychiatric: She has a normal mood and affect. Her behavior is normal.    BP 129/71   Pulse 87   Ht 5\' 3"  (1.6 m)   Wt 228 lb (103.4 kg)   LMP 12/26/2011   SpO2 100%   BMI 40.39 kg/m  Wt Readings from Last 3 Encounters:  11/23/18 228 lb (103.4 kg)  07/21/18 226 lb (102.5 kg)  04/19/18 221 lb (100.2 kg)     Health Maintenance Due  Topic Date Due  . OPHTHALMOLOGY EXAM  06/02/2018  . MAMMOGRAM  09/18/2018    There are no preventive care reminders to display for this patient.  Lab Results  Component Value Date   TSH 4.013 09/19/2008   Lab Results  Component Value Date   WBC 4.8 12/22/2017   HGB 11.1 (L) 09/09/2018   HCT 33.5 (L) 12/22/2017   MCV 72.2 (L) 12/22/2017   PLT 386  12/22/2017   Lab Results  Component Value Date   NA 140 09/09/2018   K 4.3 09/09/2018   CO2 30 09/09/2018   GLUCOSE 163 (H)  09/09/2018   BUN 17 09/09/2018   CREATININE 1.01 09/09/2018   BILITOT 0.4 12/22/2017   ALKPHOS 75 04/29/2017   AST 16 12/22/2017   ALT 12 12/22/2017   PROT 7.1 12/22/2017   ALBUMIN 3.8 01/23/2015   CALCIUM 8.8 09/09/2018   Lab Results  Component Value Date   CHOL 153 12/22/2017   Lab Results  Component Value Date   HDL 56 12/22/2017   Lab Results  Component Value Date   LDLCALC 83 12/22/2017   Lab Results  Component Value Date   TRIG 65 12/22/2017   Lab Results  Component Value Date   CHOLHDL 2.7 12/22/2017   Lab Results  Component Value Date   HGBA1C 6.5 (A) 11/23/2018      Assessment & Plan:   Problem List Items Addressed This Visit      Cardiovascular and Mediastinum   HYPERTENSION, BENIGN SYSTEMIC    Well controlled. Continue current regimen. Follow up in  6 months.       Relevant Orders   Lipid panel   COMPLETE METABOLIC PANEL WITH GFR     Endocrine   Type 2 diabetes mellitus without complication, without long-term current use of insulin (HCC) - Primary    A1c looks phenomenal today it is down to 6.5 which is great.  Keep up the great work and I will see her back in 4 months.      Relevant Orders   POCT HgB A1C (Completed)   Lipid panel   COMPLETE METABOLIC PANEL WITH GFR     Other   Low iron    Other Visit Diagnoses    Screening mammogram, encounter for       Relevant Orders   MM SCREENING BREAST TOMO BILATERAL     Low iron-continue daily supplement.  We plan to recheck levels again in about 6 months.  Reminded to schedule her mammogram.   No orders of the defined types were placed in this encounter.   Follow-up: Return in about 4 months (around 03/23/2019) for Diabetes follow-up.    Beatrice Lecher, MD

## 2018-11-23 NOTE — Patient Instructions (Signed)
Let us know what the insurance says about the [place]

## 2018-11-23 NOTE — Assessment & Plan Note (Signed)
A1c looks phenomenal today it is down to 6.5 which is great.  Keep up the great work and I will see her back in 4 months.

## 2019-03-23 ENCOUNTER — Other Ambulatory Visit: Payer: Self-pay

## 2019-03-23 ENCOUNTER — Encounter: Payer: Self-pay | Admitting: Family Medicine

## 2019-03-23 ENCOUNTER — Ambulatory Visit (INDEPENDENT_AMBULATORY_CARE_PROVIDER_SITE_OTHER): Payer: 59 | Admitting: Family Medicine

## 2019-03-23 VITALS — BP 134/88 | HR 85 | Ht 63.0 in | Wt 228.0 lb

## 2019-03-23 DIAGNOSIS — E7849 Other hyperlipidemia: Secondary | ICD-10-CM | POA: Diagnosis not present

## 2019-03-23 DIAGNOSIS — I10 Essential (primary) hypertension: Secondary | ICD-10-CM

## 2019-03-23 DIAGNOSIS — E890 Postprocedural hypothyroidism: Secondary | ICD-10-CM

## 2019-03-23 DIAGNOSIS — Z1231 Encounter for screening mammogram for malignant neoplasm of breast: Secondary | ICD-10-CM | POA: Diagnosis not present

## 2019-03-23 DIAGNOSIS — D569 Thalassemia, unspecified: Secondary | ICD-10-CM

## 2019-03-23 DIAGNOSIS — E119 Type 2 diabetes mellitus without complications: Secondary | ICD-10-CM

## 2019-03-23 LAB — POCT GLYCOSYLATED HEMOGLOBIN (HGB A1C): Hemoglobin A1C: 7 % — AB (ref 4.0–5.6)

## 2019-03-23 NOTE — Assessment & Plan Note (Signed)
Just had her follow-up appoint with Dr. Mare Ferrari last month and is doing well.  Currently on 175 mcg daily of levothyroxine.

## 2019-03-23 NOTE — Assessment & Plan Note (Signed)
A1C went up to 7.0.  It looks great back in November.  Discussed getting back on track with healthy diet.  She admits that she has not been as consistent with that.  They did also buy a stationary bike so just encouraged her to try to get on for 10 to 15 minutes a day at least to start until she is able to build up to 30 minutes.  Follow-up in 3 months.

## 2019-03-23 NOTE — Progress Notes (Signed)
Established Patient Office Visit  Subjective:  Patient ID: Martha Woods, female    DOB: [birth year]  Age: 59 y.o. MRN: [id number]  CC:  Chief Complaint  Patient presents with  . Diabetes  . Hypertension  . Hypothyroidism    HPI Martha Woods presents for   Hypertension- Pt denies chest pain, SOB, dizziness, or heart palpitations.  Taking meds as directed w/o problems.  Denies medication side effects.    Diabetes - no hypoglycemic events. No wounds or sores that are not healing well. No increased thirst or urination. Checking glucose at home. Taking medications as prescribed without any side effects.  Hyperlipidemia - tolerating stating well with no myalgias or significant side effects.  Lab Results  Component Value Date   CHOL 153 12/22/2017   HDL 56 12/22/2017   LDLCALC 83 12/22/2017   TRIG 65 12/22/2017   CHOLHDL 2.7 12/22/2017      Past Medical History:  Diagnosis Date  . Abnormal Pap smear    cervical cryo  . Cancer [facility]) 02-2009   papillary thyroid cancer, Dr. Malachy Mood  . Cervical spondylosis   . Diabetes mellitus   . Hyperlipidemia   . Hypertension   . Lumbar disc herniation   . Lumbar spondylosis   . Seborrheic dermatitis     Past Surgical History:  Procedure Laterality Date  . ett     neg for ischemia -7 mets pelvic u/s normal  . plantar fascitis  01-2010   L (Dr. Wardell Honour)  . REPLACEMENT TOTAL KNEE Right 2019  . REPLACEMENT TOTAL KNEE Left 11/42019   Dr. Tacey Ruiz  . TOTAL THYROIDECTOMY  02-2009   thyroid cancer    Family History  Problem Relation Age of Onset  . Diabetes Mother   . Diabetes Brother   . Hypertension Brother   . Cancer Maternal Grandmother        ovarian cancer    Social History   Socioeconomic History  . Marital status: Married    Spouse name: Not on file  . Number of children: Not on file  . Years of education: Not on file  . Highest education level: Not on file  Occupational History  .  Not on file  Tobacco Use  . Smoking status: Never Smoker  . Smokeless tobacco: Never Used  Substance and Sexual Activity  . Alcohol use: Yes  . Drug use: No  . Sexual activity: Not on file  Other Topics Concern  . Not on file  Social History Narrative  . Not on file   Social Determinants of Health   Financial Resource Strain:   . Difficulty of Paying Living Expenses: Not on file  Food Insecurity:   . Worried About Charity fundraiser in the Last Year: Not on file  . Ran Out of Food in the Last Year: Not on file  Transportation Needs:   . Lack of Transportation (Medical): Not on file  . Lack of Transportation (Non-Medical): Not on file  Physical Activity:   . Days of Exercise per Week: Not on file  . Minutes of Exercise per Session: Not on file  Stress:   . Feeling of Stress : Not on file  Social Connections:   . Frequency of Communication with Friends and Family: Not on file  . Frequency of Social Gatherings with Friends and Family: Not on file  . Attends Religious Services: Not on file  . Active Member of Clubs or Organizations: Not on file  . Attends Club  or Organization Meetings: Not on file  . Marital Status: Not on file  Intimate Partner Violence:   . Fear of Current or Ex-Partner: Not on file  . Emotionally Abused: Not on file  . Physically Abused: Not on file  . Sexually Abused: Not on file    Outpatient Medications Prior to Visit  Medication Sig Dispense Refill  . levothyroxine (SYNTHROID) 175 MCG tablet TAKE 1 TABLET Monday-Saturday AND 1/2 TABLET EVERY SUNDAY    . aspirin 81 MG tablet Take 81 mg by mouth daily.      . Cholecalciferol (VITAMIN D) 2000 UNITS tablet Take 2,000 Units by mouth.    . lamoTRIgine (LAMICTAL) 100 MG tablet Take 1 tablet (100 mg total) by mouth 2 (two) times daily. 60 tablet 0  . lisinopril (ZESTRIL) 20 MG tablet Take 1 tablet (20 mg total) by mouth daily. 90 tablet 3  . lovastatin (MEVACOR) 20 MG tablet Take 1 tablet (20 mg total) by  mouth at bedtime. 90 tablet 3  . orphenadrine (NORFLEX) 100 MG tablet Take 100 mg by mouth 2 (two) times daily.    . rizatriptan (MAXALT-MLT) 10 MG disintegrating tablet Take 1 tablet (10 mg total) by mouth as needed. May repeat in 2 hours if needed 30 tablet 4  . sitaGLIPtin-metformin (JANUMET) 50-1000 MG tablet TAKE 1 TABLET BY MOUTH TWO  TIMES DAILY WITH MEALS 180 tablet 3  . traMADol (ULTRAM) 50 MG tablet Take 1 tablet (50 mg total) by mouth every 6 (six) hours as needed. 90 tablet 0  . Travoprost, BAK Free, (TRAVATAN) 0.004 % SOLN ophthalmic solution Place 1 drop into both eyes every evening.    Marland Kitchen levothyroxine (SYNTHROID, LEVOTHROID) 175 MCG tablet      No facility-administered medications prior to visit.    Allergies  Allergen Reactions  . Cyclobenzaprine Other (See Comments)    Other reaction(s): Other "Made me sleep all the time." "Made me sleep all the time."   . Glipizide Other (See Comments)    Hypoglycemia  . Phentermine Other (See Comments)    palpitations  . Topamax [Topiramate] Other (See Comments)    Mood change, depression     ROS Review of Systems    Objective:    Physical Exam  Constitutional: She is oriented to person, place, and time. She appears well-developed and well-nourished.  HENT:  Head: Normocephalic and atraumatic.  Cardiovascular: Normal rate, regular rhythm and normal heart sounds.  Pulmonary/Chest: Effort normal and breath sounds normal.  Neurological: She is alert and oriented to person, place, and time.  Skin: Skin is warm and dry.  Psychiatric: She has a normal mood and affect. Her behavior is normal.    BP 134/88   Pulse 85   Ht 5\' 3"  (1.6 m)   Wt 228 lb (103.4 kg)   LMP 12/26/2011   SpO2 100%   BMI 40.39 kg/m  Wt Readings from Last 3 Encounters:  03/23/19 228 lb (103.4 kg)  11/23/18 228 lb (103.4 kg)  07/21/18 226 lb (102.5 kg)     Health Maintenance Due  Topic Date Due  . OPHTHALMOLOGY EXAM  06/02/2018  . MAMMOGRAM   09/18/2018    There are no preventive care reminders to display for this patient.  Lab Results  Component Value Date   TSH 4.013 09/19/2008   Lab Results  Component Value Date   WBC 4.8 12/22/2017   HGB 11.1 (L) 09/09/2018   HCT 33.5 (L) 12/22/2017   MCV 72.2 (L) 12/22/2017  PLT 386 12/22/2017   Lab Results  Component Value Date   NA 140 09/09/2018   K 4.3 09/09/2018   CO2 30 09/09/2018   GLUCOSE 163 (H) 09/09/2018   BUN 17 09/09/2018   CREATININE 1.01 09/09/2018   BILITOT 0.4 12/22/2017   ALKPHOS 75 04/29/2017   AST 16 12/22/2017   ALT 12 12/22/2017   PROT 7.1 12/22/2017   ALBUMIN 3.8 01/23/2015   CALCIUM 8.8 09/09/2018   Lab Results  Component Value Date   CHOL 153 12/22/2017   Lab Results  Component Value Date   HDL 56 12/22/2017   Lab Results  Component Value Date   LDLCALC 83 12/22/2017   Lab Results  Component Value Date   TRIG 65 12/22/2017   Lab Results  Component Value Date   CHOLHDL 2.7 12/22/2017   Lab Results  Component Value Date   HGBA1C 7.0 (A) 03/23/2019      Assessment & Plan:   Problem List Items Addressed This Visit      Cardiovascular and Mediastinum   HYPERTENSION, BENIGN SYSTEMIC    Well controlled. Continue current regimen. Follow up in  3 months.       Relevant Orders   POCT glycosylated hemoglobin (Hb A1C) (Completed)   COMPLETE METABOLIC PANEL WITH GFR   Lipid panel   CBC     Endocrine   Type 2 diabetes mellitus without complication, without long-term current use of insulin (HCC) - Primary    A1C went up to 7.0.  It looks great back in November.  Discussed getting back on track with healthy diet.  She admits that she has not been as consistent with that.  They did also buy a stationary bike so just encouraged her to try to get on for 10 to 15 minutes a day at least to start until she is able to build up to 30 minutes.  Follow-up in 3 months.      Relevant Orders   POCT glycosylated hemoglobin (Hb A1C)  (Completed)   COMPLETE METABOLIC PANEL WITH GFR   Lipid panel   CBC   Post-surgical hypothyroidism    Just had her follow-up appoint with Dr. Mare Ferrari last month and is doing well.  Currently on 175 mcg daily of levothyroxine.      Relevant Medications   levothyroxine (SYNTHROID) 175 MCG tablet     Other   Thalassemia    We will check CBC today.      Hyperlipidemia   Relevant Orders   POCT glycosylated hemoglobin (Hb A1C) (Completed)   COMPLETE METABOLIC PANEL WITH GFR   Lipid panel   CBC    Other Visit Diagnoses    Screening mammogram, encounter for       Relevant Orders   MM 3D SCREEN BREAST BILATERAL     Due for screening mammogram. She will schedule today.   No orders of the defined types were placed in this encounter.   Follow-up: Return in about 3 months (around 06/23/2019) for Diabetes follow-up.    Beatrice Lecher, MD

## 2019-03-23 NOTE — Assessment & Plan Note (Signed)
We will check CBC today.

## 2019-03-23 NOTE — Assessment & Plan Note (Signed)
Well controlled. Continue current regimen. Follow up in  3 months.  

## 2019-03-24 LAB — COMPLETE METABOLIC PANEL WITH GFR
AG Ratio: 1.4 (calc) (ref 1.0–2.5)
ALT: 16 U/L (ref 6–29)
AST: 15 U/L (ref 10–35)
Albumin: 3.9 g/dL (ref 3.6–5.1)
Alkaline phosphatase (APISO): 69 U/L (ref 37–153)
BUN: 19 mg/dL (ref 7–25)
CO2: 28 mmol/L (ref 20–32)
Calcium: 8.6 mg/dL (ref 8.6–10.4)
Chloride: 102 mmol/L (ref 98–110)
Creat: 0.78 mg/dL (ref 0.50–0.99)
GFR, Est African American: 96 mL/min/{1.73_m2} (ref >=60)
GFR, Est Non African American: 83 mL/min/{1.73_m2} (ref >=60)
Globulin: 2.8 g/dL (calc) (ref 1.9–3.7)
Glucose, Bld: 131 mg/dL — ABNORMAL HIGH (ref 65–99)
Potassium: 4.8 mmol/L (ref 3.5–5.3)
Sodium: 139 mmol/L (ref 135–146)
Total Bilirubin: 0.4 mg/dL (ref 0.2–1.2)
Total Protein: 6.7 g/dL (ref 6.1–8.1)

## 2019-03-24 LAB — CBC
HCT: 37.1 % (ref 35.0–45.0)
Hemoglobin: 11.4 g/dL — ABNORMAL LOW (ref 11.7–15.5)
MCH: 22.5 pg — ABNORMAL LOW (ref 27.0–33.0)
MCHC: 30.7 g/dL — ABNORMAL LOW (ref 32.0–36.0)
MCV: 73.2 fL — ABNORMAL LOW (ref 80.0–100.0)
MPV: 10.8 fL (ref 7.5–12.5)
Platelets: 317 10*3/uL (ref 140–400)
RBC: 5.07 10*6/uL (ref 3.80–5.10)
RDW: 14 % (ref 11.0–15.0)
WBC: 6.3 10*3/uL (ref 3.8–10.8)

## 2019-03-24 LAB — LIPID PANEL
Cholesterol: 165 mg/dL (ref <200)
HDL: 58 mg/dL (ref >=50)
LDL Cholesterol (Calc): 89 mg/dL (calc)
Non-HDL Cholesterol (Calc): 107 mg/dL (calc) (ref <130)
Total CHOL/HDL Ratio: 2.8 (calc) (ref <5.0)
Triglycerides: 85 mg/dL (ref <150)

## 2019-03-30 ENCOUNTER — Ambulatory Visit (INDEPENDENT_AMBULATORY_CARE_PROVIDER_SITE_OTHER): Payer: 59

## 2019-03-30 ENCOUNTER — Other Ambulatory Visit: Payer: Self-pay

## 2019-03-30 DIAGNOSIS — Z1231 Encounter for screening mammogram for malignant neoplasm of breast: Secondary | ICD-10-CM

## 2019-06-15 ENCOUNTER — Telehealth: Payer: Self-pay

## 2019-06-15 NOTE — Telephone Encounter (Signed)
Recommend virttual visit tomorrow.

## 2019-06-15 NOTE — Telephone Encounter (Signed)
Pt called c/o fatigue and body aches since yesterday.  Pt states that it symptoms have been so bad that she did not go to work today.  Pt denies fever, cough, shortness of breath or runny nose as well as any other symptoms.  Pt is requesting advice.  Please advise.  Charyl Bigger, CMA

## 2019-06-16 NOTE — Telephone Encounter (Signed)
Please schedule for a virtual visit today per Dr Madilyn Fireman

## 2019-06-16 NOTE — Telephone Encounter (Signed)
Patient stated that she rested and feels a lot better. She said if she gets worse or anything happens she will call back. At this time patient declined appointment.

## 2019-06-23 ENCOUNTER — Ambulatory Visit: Payer: 59 | Admitting: Family Medicine

## 2019-06-28 NOTE — Progress Notes (Signed)
Established Patient Office Visit  Subjective:  Patient ID: Martha Woods, female    DOB: 25-Sep-1960  Age: 59 y.o. MRN: [id number]  CC:  Chief Complaint  Patient presents with  . Diabetes    will call for eye exam Dr. Domingo Cocking   HPI Martha Woods presents for   Diabetes - no hypoglycemic events. No wounds or sores that are not healing well. No increased thirst or urination. Checking glucose at home. Taking medications as prescribed without any side effects.  Hypertension- Pt denies chest pain, SOB, dizziness, or heart palpitations.  Taking meds as directed w/o problems.  Denies medication side effects.    She did let me know she cut back on her hours at work.  She was getting home late not getting enough sleep and just feeling exhausted and stressed.  Is like that is what was going on when she had called earlier this month and said that she just felt completely exhausted.   Past Medical History:  Diagnosis Date  . Abnormal Pap smear    cervical cryo  . Cancer [facility] 02-2009   papillary thyroid cancer, Dr. Malachy Mood  . Cervical spondylosis   . Diabetes mellitus   . Hyperlipidemia   . Hypertension   . Lumbar disc herniation   . Lumbar spondylosis   . Seborrheic dermatitis     Past Surgical History:  Procedure Laterality Date  . ett     neg for ischemia -7 mets pelvic u/s normal  . plantar fascitis  01-2010   L (Dr. Wardell Honour)  . REDUCTION MAMMAPLASTY Bilateral    sept 2019  . REPLACEMENT TOTAL KNEE Right 2019  . REPLACEMENT TOTAL KNEE Left 11/42019   Dr. Tacey Ruiz  . TOTAL THYROIDECTOMY  02-2009   thyroid cancer    Family History  Problem Relation Age of Onset  . Diabetes Mother   . Diabetes Brother   . Hypertension Brother   . Cancer Maternal Grandmother        ovarian cancer    Social History   Socioeconomic History  . Marital status: Married    Spouse name: Not on file  . Number of children: Not on file  . Years of education: Not  on file  . Highest education level: Not on file  Occupational History  . Not on file  Tobacco Use  . Smoking status: Never Smoker  . Smokeless tobacco: Never Used  Substance and Sexual Activity  . Alcohol use: Yes  . Drug use: No  . Sexual activity: Not on file  Other Topics Concern  . Not on file  Social History Narrative  . Not on file   Social Determinants of Health   Financial Resource Strain:   . Difficulty of Paying Living Expenses:   Food Insecurity:   . Worried About Charity fundraiser in the Last Year:   . Arboriculturist in the Last Year:   Transportation Needs:   . Film/video editor (Medical):   Marland Kitchen Lack of Transportation (Non-Medical):   Physical Activity:   . Days of Exercise per Week:   . Minutes of Exercise per Session:   Stress:   . Feeling of Stress :   Social Connections:   . Frequency of Communication with Friends and Family:   . Frequency of Social Gatherings with Friends and Family:   . Attends Religious Services:   . Active Member of Clubs or Organizations:   . Attends Archivist Meetings:   .  Marital Status:   Intimate Partner Violence:   . Fear of Current or Ex-Partner:   . Emotionally Abused:   Marland Kitchen Physically Abused:   . Sexually Abused:     Outpatient Medications Prior to Visit  Medication Sig Dispense Refill  . aspirin 81 MG tablet Take 81 mg by mouth daily.      . Cholecalciferol (VITAMIN D) 2000 UNITS tablet Take 2,000 Units by mouth.    . lamoTRIgine (LAMICTAL) 100 MG tablet Take 1 tablet (100 mg total) by mouth 2 (two) times daily. 60 tablet 0  . levothyroxine (SYNTHROID) 175 MCG tablet TAKE 1 TABLET Monday-Saturday AND 1/2 TABLET EVERY SUNDAY    . lovastatin (MEVACOR) 20 MG tablet Take 1 tablet (20 mg total) by mouth at bedtime. 90 tablet 3  . orphenadrine (NORFLEX) 100 MG tablet Take 100 mg by mouth 2 (two) times daily.    . rizatriptan (MAXALT-MLT) 10 MG disintegrating tablet Take 1 tablet (10 mg total) by mouth as  needed. May repeat in 2 hours if needed 30 tablet 4  . sitaGLIPtin-metformin (JANUMET) 50-1000 MG tablet TAKE 1 TABLET BY MOUTH TWO  TIMES DAILY WITH MEALS 180 tablet 3  . traMADol (ULTRAM) 50 MG tablet Take 1 tablet (50 mg total) by mouth every 6 (six) hours as needed. 90 tablet 0  . Travoprost, BAK Free, (TRAVATAN) 0.004 % SOLN ophthalmic solution Place 1 drop into both eyes every evening.    Marland Kitchen lisinopril (ZESTRIL) 20 MG tablet Take 1 tablet (20 mg total) by mouth daily. 90 tablet 3   No facility-administered medications prior to visit.    Allergies  Allergen Reactions  . Cyclobenzaprine Other (See Comments)    Other reaction(s): Other "Made me sleep all the time." "Made me sleep all the time."   . Glipizide Other (See Comments)    Hypoglycemia  . Phentermine Other (See Comments)    palpitations  . Topamax [Topiramate] Other (See Comments)    Mood change, depression     ROS Review of Systems    Objective:    Physical Exam Constitutional:      Appearance: She is well-developed.  HENT:     Head: Normocephalic and atraumatic.  Cardiovascular:     Rate and Rhythm: Normal rate and regular rhythm.     Heart sounds: Normal heart sounds.  Pulmonary:     Effort: Pulmonary effort is normal.     Breath sounds: Normal breath sounds.  Skin:    General: Skin is warm and dry.  Neurological:     Mental Status: She is alert and oriented to person, place, and time.  Psychiatric:        Behavior: Behavior normal.     BP (!) 143/75   Pulse 79   Ht 5\' 3"  (1.6 m)   Wt 232 lb (105.2 kg)   LMP 12/26/2011   SpO2 98%   BMI 41.10 kg/m  Wt Readings from Last 3 Encounters:  06/29/19 232 lb (105.2 kg)  03/23/19 228 lb (103.4 kg)  11/23/18 228 lb (103.4 kg)     Health Maintenance Due  Topic Date Due  . OPHTHALMOLOGY EXAM  06/02/2018    There are no preventive care reminders to display for this patient.  Lab Results  Component Value Date   TSH 4.013 09/19/2008   Lab  Results  Component Value Date   WBC 6.3 03/23/2019   HGB 11.4 (L) 03/23/2019   HCT 37.1 03/23/2019   MCV 73.2 (L) 03/23/2019  PLT 317 03/23/2019   Lab Results  Component Value Date   NA 139 03/23/2019   K 4.8 03/23/2019   CO2 28 03/23/2019   GLUCOSE 131 (H) 03/23/2019   BUN 19 03/23/2019   CREATININE 0.78 03/23/2019   BILITOT 0.4 03/23/2019   ALKPHOS 75 04/29/2017   AST 15 03/23/2019   ALT 16 03/23/2019   PROT 6.7 03/23/2019   ALBUMIN 3.8 01/23/2015   CALCIUM 8.6 03/23/2019   Lab Results  Component Value Date   CHOL 165 03/23/2019   Lab Results  Component Value Date   HDL 58 03/23/2019   Lab Results  Component Value Date   LDLCALC 89 03/23/2019   Lab Results  Component Value Date   TRIG 85 03/23/2019   Lab Results  Component Value Date   CHOLHDL 2.8 03/23/2019   Lab Results  Component Value Date   HGBA1C 7.5 (A) 06/29/2019      Assessment & Plan:   Problem List Items Addressed This Visit      Cardiovascular and Mediastinum   Migraine headache    Currently on Lamictal for her migraine she says it works well to control them.  We discussed that at some point in the future if things become less stressful and or she even retires it may be worth weaning her off of the medication especially now that she is postmenopausal she said she actually try to come off her about a month recently and the headache started back.      Relevant Medications   lisinopril (ZESTRIL) 40 MG tablet   HYPERTENSION, BENIGN SYSTEMIC - Primary    Blood pressure is a little elevated today.  Will increase lisinopril to 40 mg.  Continue to work on healthy diet and regular exercise.      Relevant Medications   lisinopril (ZESTRIL) 40 MG tablet     Endocrine   Type 2 diabetes mellitus without complication, without long-term current use of insulin (HCC)    Discussed options including discontinuing the Januvia component of her Janumet and using a flows and instead.  She was concerned  about the potential diuretic effect.  She says now that she has changed her work schedule and she will have more time for herself she really wants 3 months to really work on lifestyle changes on her own before we adjust her medication.  We also talked about doing an injectable such as Saxenda or Ozempic or Trulicity to also help with her weight in addition to lowering her blood sugar levels she said she is not interested in all an injection at this time.  Continue ACE inhibitor and statin.      Relevant Medications   lisinopril (ZESTRIL) 40 MG tablet   Other Relevant Orders   POCT glycosylated hemoglobin (Hb A1C) (Completed)    Other Visit Diagnoses    Stress at work          Stress at work again she has made some modifications to her work so that she will have more time for self-care and more time to make sure that she is getting adequate sleep which she has not been able to do recently.  Meds ordered this encounter  Medications  . lisinopril (ZESTRIL) 40 MG tablet    Sig: Take 1 tablet (40 mg total) by mouth daily.    Dispense:  90 tablet    Refill:  1    Follow-up: Return in about 3 months (around 09/29/2019) for Diabetes follow-up.    Barnetta Chapel  Madilyn Fireman, MD

## 2019-06-29 ENCOUNTER — Other Ambulatory Visit: Payer: Self-pay

## 2019-06-29 ENCOUNTER — Encounter: Payer: Self-pay | Admitting: Family Medicine

## 2019-06-29 ENCOUNTER — Ambulatory Visit (INDEPENDENT_AMBULATORY_CARE_PROVIDER_SITE_OTHER): Payer: 59 | Admitting: Family Medicine

## 2019-06-29 VITALS — BP 143/75 | HR 79 | Ht 63.0 in | Wt 232.0 lb

## 2019-06-29 DIAGNOSIS — Z566 Other physical and mental strain related to work: Secondary | ICD-10-CM

## 2019-06-29 DIAGNOSIS — E119 Type 2 diabetes mellitus without complications: Secondary | ICD-10-CM

## 2019-06-29 DIAGNOSIS — I10 Essential (primary) hypertension: Secondary | ICD-10-CM

## 2019-06-29 DIAGNOSIS — G43909 Migraine, unspecified, not intractable, without status migrainosus: Secondary | ICD-10-CM | POA: Diagnosis not present

## 2019-06-29 LAB — POCT GLYCOSYLATED HEMOGLOBIN (HGB A1C): Hemoglobin A1C: 7.5 % — AB (ref 4.0–5.6)

## 2019-06-29 MED ORDER — LISINOPRIL 40 MG PO TABS: 40.0000 mg | ORAL_TABLET | Freq: Every day | ORAL | 1 refills | Status: DC

## 2019-06-29 NOTE — Assessment & Plan Note (Signed)
Blood pressure is a little elevated today.  Will increase lisinopril to 40 mg.  Continue to work on healthy diet and regular exercise.

## 2019-06-29 NOTE — Assessment & Plan Note (Signed)
Currently on Lamictal for her migraine she says it works well to control them.  We discussed that at some point in the future if things become less stressful and or she even retires it may be worth weaning her off of the medication especially now that she is postmenopausal she said she actually try to come off her about a month recently and the headache started back.

## 2019-06-29 NOTE — Assessment & Plan Note (Signed)
Discussed options including discontinuing the Januvia component of her Janumet and using a flows and instead.  She was concerned about the potential diuretic effect.  She says now that she has changed her work schedule and she will have more time for herself she really wants 3 months to really work on lifestyle changes on her own before we adjust her medication.  We also talked about doing an injectable such as Saxenda or Ozempic or Trulicity to also help with her weight in addition to lowering her blood sugar levels she said she is not interested in all an injection at this time.  Continue ACE inhibitor and statin.

## 2019-08-21 ENCOUNTER — Other Ambulatory Visit: Payer: Self-pay | Admitting: Family Medicine

## 2019-08-21 DIAGNOSIS — I10 Essential (primary) hypertension: Secondary | ICD-10-CM

## 2019-09-01 ENCOUNTER — Telehealth: Payer: Self-pay

## 2019-09-01 MED ORDER — JANUMET 50-1000 MG PO TABS: ORAL_TABLET | ORAL | 3 refills | Status: DC

## 2019-09-01 MED ORDER — LISINOPRIL 40 MG PO TABS: 40.0000 mg | ORAL_TABLET | Freq: Every day | ORAL | 3 refills | Status: DC

## 2019-09-01 NOTE — Telephone Encounter (Signed)
Karmah called and states she need a new prescription for Lisinopril 40 mg. She states it was increased during last visit.

## 2019-09-29 ENCOUNTER — Ambulatory Visit: Payer: 59 | Admitting: Family Medicine

## 2019-10-13 ENCOUNTER — Ambulatory Visit (INDEPENDENT_AMBULATORY_CARE_PROVIDER_SITE_OTHER): Payer: 59 | Admitting: Family Medicine

## 2019-10-13 ENCOUNTER — Encounter: Payer: Self-pay | Admitting: Family Medicine

## 2019-10-13 VITALS — BP 142/62 | HR 88 | Ht 63.0 in | Wt 241.0 lb

## 2019-10-13 DIAGNOSIS — I10 Essential (primary) hypertension: Secondary | ICD-10-CM | POA: Diagnosis not present

## 2019-10-13 DIAGNOSIS — G5603 Carpal tunnel syndrome, bilateral upper limbs: Secondary | ICD-10-CM

## 2019-10-13 DIAGNOSIS — Z23 Encounter for immunization: Secondary | ICD-10-CM

## 2019-10-13 DIAGNOSIS — E890 Postprocedural hypothyroidism: Secondary | ICD-10-CM | POA: Diagnosis not present

## 2019-10-13 DIAGNOSIS — E119 Type 2 diabetes mellitus without complications: Secondary | ICD-10-CM

## 2019-10-13 LAB — POCT GLYCOSYLATED HEMOGLOBIN (HGB A1C): Hemoglobin A1C: 7.3 % — AB (ref 4.0–5.6)

## 2019-10-13 MED ORDER — LISINOPRIL 40 MG PO TABS
40.0000 mg | ORAL_TABLET | Freq: Every day | ORAL | 3 refills | Status: DC
Start: 2019-10-13 — End: 2020-07-26

## 2019-10-13 NOTE — Assessment & Plan Note (Addendum)
Unfortunately recurring and she started to have significant pain and tingling again in her fingers.  Note provided to request that she be allowed to use a laptop and 7 iPad which she feels is aggravating her symptoms.  Also recommend that she get in with Dr. Dianah Field for possible injections and treatment.

## 2019-10-13 NOTE — Assessment & Plan Note (Signed)
She previously seen Dr. Malachy Mood.  She would like me to take over her thyroid medication but she is due to recheck her blood levels.  TSH ordered today.

## 2019-10-13 NOTE — Progress Notes (Signed)
Established Patient Office Visit  Subjective:  Patient ID: Martha Woods, female    DOB: 1960-04-24  Age: 59 y.o. MRN: [id number]  CC:  Chief Complaint  Patient presents with  . Diabetes    HPI [name] presents for   Hypertension- Pt denies chest pain, SOB, dizziness, or heart palpitations.  Taking meds as directed w/o problems.  Denies medication side effects.    Diabetes - no hypoglycemic events. No wounds or sores that are not healing well. No increased thirst or urination. Checking glucose at home. Taking medications as prescribed without any side effects.  Also complains that her carpal tunnel is flaring back up she has had problems on and off for several years and in fact used to get injections with Dr. Berdine Addison her neurologist but has not seen him in a little while.  She says about a year ago they actually switched from using laptops to iPads and she really really feels like that is been aggravating her symptoms.  Past Medical History:  Diagnosis Date  . Abnormal Pap smear    cervical cryo  . Cancer [facility]) 02-2009   papillary thyroid cancer, Dr. Malachy Mood  . Cervical spondylosis   . Diabetes mellitus   . Hyperlipidemia   . Hypertension   . Lumbar disc herniation   . Lumbar spondylosis   . Seborrheic dermatitis     Past Surgical History:  Procedure Laterality Date  . ett     neg for ischemia -7 mets pelvic u/s normal  . plantar fascitis  01-2010   L (Dr. Wardell Honour)  . REDUCTION MAMMAPLASTY Bilateral    sept 2019  . REPLACEMENT TOTAL KNEE Right 2019  . REPLACEMENT TOTAL KNEE Left 11/42019   Dr. Tacey Ruiz  . TOTAL THYROIDECTOMY  02-2009   thyroid cancer    Family History  Problem Relation Age of Onset  . Diabetes Mother   . Diabetes Brother   . Hypertension Brother   . Cancer Maternal Grandmother        ovarian cancer    Social History   Socioeconomic History  . Marital status: Married    Spouse name: Not on file  . Number of  children: Not on file  . Years of education: Not on file  . Highest education level: Not on file  Occupational History  . Not on file  Tobacco Use  . Smoking status: Never Smoker  . Smokeless tobacco: Never Used  Substance and Sexual Activity  . Alcohol use: Yes  . Drug use: No  . Sexual activity: Not on file  Other Topics Concern  . Not on file  Social History Narrative  . Not on file   Social Determinants of Health   Financial Resource Strain:   . Difficulty of Paying Living Expenses: Not on file  Food Insecurity:   . Worried About Charity fundraiser in the Last Year: Not on file  . Ran Out of Food in the Last Year: Not on file  Transportation Needs:   . Lack of Transportation (Medical): Not on file  . Lack of Transportation (Non-Medical): Not on file  Physical Activity:   . Days of Exercise per Week: Not on file  . Minutes of Exercise per Session: Not on file  Stress:   . Feeling of Stress : Not on file  Social Connections:   . Frequency of Communication with Friends and Family: Not on file  . Frequency of Social Gatherings with Friends and Family: Not on  file  . Attends Religious Services: Not on file  . Active Member of Clubs or Organizations: Not on file  . Attends Archivist Meetings: Not on file  . Marital Status: Not on file  Intimate Partner Violence:   . Fear of Current or Ex-Partner: Not on file  . Emotionally Abused: Not on file  . Physically Abused: Not on file  . Sexually Abused: Not on file    Outpatient Medications Prior to Visit  Medication Sig Dispense Refill  . aspirin 81 MG tablet Take 81 mg by mouth daily.      . Cholecalciferol (VITAMIN D) 2000 UNITS tablet Take 2,000 Units by mouth.    . levothyroxine (SYNTHROID) 175 MCG tablet TAKE 1 TABLET Monday-Saturday AND 1/2 TABLET EVERY SUNDAY    . lovastatin (MEVACOR) 20 MG tablet TAKE 1 TABLET AT BEDTIME 90 tablet 3  . rizatriptan (MAXALT-MLT) 10 MG disintegrating tablet Take 1 tablet  (10 mg total) by mouth as needed. May repeat in 2 hours if needed 30 tablet 4  . sitaGLIPtin-metformin (JANUMET) 50-1000 MG tablet TAKE 1 TABLET BY MOUTH TWO  TIMES DAILY WITH MEALS 180 tablet 3  . traMADol (ULTRAM) 50 MG tablet Take 1 tablet (50 mg total) by mouth every 6 (six) hours as needed. 90 tablet 0  . Travoprost, BAK Free, (TRAVATAN) 0.004 % SOLN ophthalmic solution Place 1 drop into both eyes every evening.    Marland Kitchen lisinopril (ZESTRIL) 40 MG tablet Take 1 tablet (40 mg total) by mouth daily. 90 tablet 3  . lamoTRIgine (LAMICTAL) 100 MG tablet Take 1 tablet (100 mg total) by mouth 2 (two) times daily. 60 tablet 0  . orphenadrine (NORFLEX) 100 MG tablet Take 100 mg by mouth 2 (two) times daily.     No facility-administered medications prior to visit.    Allergies  Allergen Reactions  . Cyclobenzaprine Other (See Comments)    Other reaction(s): Other "Made me sleep all the time." "Made me sleep all the time."   . Glipizide Other (See Comments)    Hypoglycemia  . Phentermine Other (See Comments)    palpitations  . Topamax [Topiramate] Other (See Comments)    Mood change, depression     ROS Review of Systems    Objective:    Physical Exam Constitutional:      Appearance: She is well-developed.  HENT:     Head: Normocephalic and atraumatic.  Cardiovascular:     Rate and Rhythm: Normal rate and regular rhythm.     Heart sounds: Normal heart sounds.  Pulmonary:     Effort: Pulmonary effort is normal.     Breath sounds: Normal breath sounds.  Skin:    General: Skin is warm and dry.  Neurological:     Mental Status: She is alert and oriented to person, place, and time.  Psychiatric:        Behavior: Behavior normal.     BP (!) 142/62   Pulse 88   Ht 5\' 3"  (1.6 m)   Wt 241 lb (109.3 kg)   LMP 12/26/2011   SpO2 100%   BMI 42.69 kg/m  Wt Readings from Last 3 Encounters:  10/13/19 241 lb (109.3 kg)  06/29/19 232 lb (105.2 kg)  03/23/19 228 lb (103.4 kg)      Health Maintenance Due  Topic Date Due  . OPHTHALMOLOGY EXAM  06/02/2018    There are no preventive care reminders to display for this patient.  Lab Results  Component Value Date  TSH 4.013 09/19/2008   Lab Results  Component Value Date   WBC 6.3 03/23/2019   HGB 11.4 (L) 03/23/2019   HCT 37.1 03/23/2019   MCV 73.2 (L) 03/23/2019   PLT 317 03/23/2019   Lab Results  Component Value Date   NA 139 03/23/2019   K 4.8 03/23/2019   CO2 28 03/23/2019   GLUCOSE 131 (H) 03/23/2019   BUN 19 03/23/2019   CREATININE 0.78 03/23/2019   BILITOT 0.4 03/23/2019   ALKPHOS 75 04/29/2017   AST 15 03/23/2019   ALT 16 03/23/2019   PROT 6.7 03/23/2019   ALBUMIN 3.8 01/23/2015   CALCIUM 8.6 03/23/2019   Lab Results  Component Value Date   CHOL 165 03/23/2019   Lab Results  Component Value Date   HDL 58 03/23/2019   Lab Results  Component Value Date   LDLCALC 89 03/23/2019   Lab Results  Component Value Date   TRIG 85 03/23/2019   Lab Results  Component Value Date   CHOLHDL 2.8 03/23/2019   Lab Results  Component Value Date   HGBA1C 7.3 (A) 10/13/2019      Assessment & Plan:   Problem List Items Addressed This Visit      Cardiovascular and Mediastinum   HYPERTENSION, BENIGN SYSTEMIC - Primary   Relevant Medications   lisinopril (ZESTRIL) 40 MG tablet   Other Relevant Orders   BASIC METABOLIC PANEL WITH GFR     Endocrine   Type 2 diabetes mellitus without complication, without long-term current use of insulin (HCC)   Relevant Medications   lisinopril (ZESTRIL) 40 MG tablet   Other Relevant Orders   POCT glycosylated hemoglobin (Hb A1C) (Completed)   BASIC METABOLIC PANEL WITH GFR   Post-surgical hypothyroidism    She previously seen Dr. Malachy Mood.  She would like me to take over her thyroid medication but she is due to recheck her blood levels.  TSH ordered today.      Relevant Orders   TSH + free T4     Nervous and Auditory   Carpal tunnel  syndrome, bilateral    Unfortunately recurring and she started to have significant pain and tingling again in her fingers.  Note provided to request that she be allowed to use a laptop and 7 iPad which she feels is aggravating her symptoms.  Also recommend that she get in with Dr. Dianah Field for possible injections and treatment.       Other Visit Diagnoses    Need for immunization against influenza       Relevant Orders   Flu Vaccine QUAD 36+ mos IM (Completed)      Meds ordered this encounter  Medications  . lisinopril (ZESTRIL) 40 MG tablet    Sig: Take 1 tablet (40 mg total) by mouth daily.    Dispense:  90 tablet    Refill:  3    Follow-up: Return in about 3 months (around 01/12/2020) for Diabetes follow-up, Hypertension.    Beatrice Lecher, MD

## 2019-10-27 ENCOUNTER — Encounter: Payer: Self-pay | Admitting: Sports Medicine

## 2019-10-27 ENCOUNTER — Ambulatory Visit (INDEPENDENT_AMBULATORY_CARE_PROVIDER_SITE_OTHER): Payer: 59

## 2019-10-27 ENCOUNTER — Ambulatory Visit (INDEPENDENT_AMBULATORY_CARE_PROVIDER_SITE_OTHER): Payer: 59 | Admitting: Sports Medicine

## 2019-10-27 ENCOUNTER — Other Ambulatory Visit: Payer: Self-pay

## 2019-10-27 VITALS — BP 148/83 | HR 102

## 2019-10-27 DIAGNOSIS — G5603 Carpal tunnel syndrome, bilateral upper limbs: Secondary | ICD-10-CM

## 2019-10-27 DIAGNOSIS — M180 Bilateral primary osteoarthritis of first carpometacarpal joints: Secondary | ICD-10-CM | POA: Diagnosis not present

## 2019-10-27 MED ORDER — OMEPRAZOLE 20 MG PO CPDR: 20.0000 mg | DELAYED_RELEASE_CAPSULE | Freq: Every day | ORAL | 11 refills | Status: DC

## 2019-10-27 NOTE — Patient Instructions (Signed)
Mediterranean Diet A Mediterranean diet refers to food and lifestyle choices that are based on the traditions of countries located on the [place]. This way of eating has been shown to help prevent certain conditions and improve outcomes for people who have chronic diseases, like kidney disease and heart disease. What are tips for following this plan? Lifestyle  Cook and eat meals together with your family, when possible.  Drink enough fluid to keep your urine clear or pale yellow.  Be physically active every day. This includes: ? Aerobic exercise like running or swimming. ? Leisure activities like gardening, walking, or housework.  Get 7-8 hours of sleep each night.  If recommended by your health care provider, drink red wine in moderation. This means 1 glass a day for nonpregnant women and 2 glasses a day for men. A glass of wine equals 5 oz (150 mL). Reading food labels  Check the serving size of packaged foods. For foods such as rice and pasta, the serving size refers to the amount of cooked product, not dry.  Check the total fat in packaged foods. Avoid foods that have saturated fat or trans fats.  Check the ingredients list for added sugars, such as corn syrup.   Shopping  At the grocery store, buy most of your food from the areas near the walls of the store. This includes: ? Fresh fruits and vegetables (produce). ? Grains, beans, nuts, and seeds. Some of these may be available in unpackaged forms or large amounts (in bulk). ? Fresh seafood. ? Poultry and eggs. ? Low-fat dairy products.  Buy whole ingredients instead of prepackaged foods.  Buy fresh fruits and vegetables in-season from local farmers markets.  Buy frozen fruits and vegetables in resealable bags.  If you do not have access to quality fresh seafood, buy precooked frozen shrimp or canned fish, such as tuna, salmon, or sardines.  Buy small amounts of raw or cooked vegetables, salads, or olives from  the deli or salad bar at your store.  Stock your pantry so you always have certain foods on hand, such as olive oil, canned tuna, canned tomatoes, rice, pasta, and beans. Cooking  Cook foods with extra-virgin olive oil instead of using butter or other vegetable oils.  Have meat as a side dish, and have vegetables or grains as your main dish. This means having meat in small portions or adding small amounts of meat to foods like pasta or stew.  Use beans or vegetables instead of meat in common dishes like chili or lasagna.  Experiment with different cooking methods. Try roasting or broiling vegetables instead of steaming or sauteing them.  Add frozen vegetables to soups, stews, pasta, or rice.  Add nuts or seeds for added healthy fat at each meal. You can add these to yogurt, salads, or vegetable dishes.  Marinate fish or vegetables using olive oil, lemon juice, garlic, and fresh herbs. Meal planning  Plan to eat 1 vegetarian meal one day each week. Try to work up to 2 vegetarian meals, if possible.  Eat seafood 2 or more times a week.  Have healthy snacks readily available, such as: ? Vegetable sticks with hummus. ? Greek yogurt. ? Fruit and nut trail mix.  Eat balanced meals throughout the week. This includes: ? Fruit: 2-3 servings a day ? Vegetables: 4-5 servings a day ? Low-fat dairy: 2 servings a day ? Fish, poultry, or lean meat: 1 serving a day ? Beans and legumes: 2 or more servings a week ?   Nuts and seeds: 1-2 servings a day ? Whole grains: 6-8 servings a day ? Extra-virgin olive oil: 3-4 servings a day  Limit red meat and sweets to only a few servings a month   What are my food choices?  Mediterranean diet ? Recommended  Grains: Whole-grain pasta. Brown rice. Bulgar wheat. Polenta. Couscous. Whole-wheat bread. Oatmeal. Quinoa.  Vegetables: Artichokes. Beets. Broccoli. Cabbage. Carrots. Eggplant. Green beans. Chard. Kale. Spinach. Onions. Leeks. Peas. Squash.  Tomatoes. Peppers. Radishes.  Fruits: Apples. Apricots. Avocado. Berries. Bananas. Cherries. Dates. Figs. Grapes. Lemons. Melon. Oranges. Peaches. Plums. Pomegranate.  Meats and other protein foods: Beans. Almonds. Sunflower seeds. Pine nuts. Peanuts. Cod. Salmon. Scallops. Shrimp. Tuna. Tilapia. Clams. Oysters. Eggs.  Dairy: Low-fat milk. Cheese. Greek yogurt.  Beverages: Water. Red wine. Herbal tea.  Fats and oils: Extra virgin olive oil. Avocado oil. Grape seed oil.  Sweets and desserts: Greek yogurt with honey. Baked apples. Poached pears. Trail mix.  Seasoning and other foods: Basil. Cilantro. Coriander. Cumin. Mint. Parsley. Sage. Rosemary. Tarragon. Garlic. Oregano. Thyme. Pepper. Balsalmic vinegar. Tahini. Hummus. Tomato sauce. Olives. Mushrooms. ? Limit these  Grains: Prepackaged pasta or rice dishes. Prepackaged cereal with added sugar.  Vegetables: Deep fried potatoes (french fries).  Fruits: Fruit canned in syrup.  Meats and other protein foods: Beef. Pork. Lamb. Poultry with skin. Hot dogs. Bacon.  Dairy: Ice cream. Sour cream. Whole milk.  Beverages: Juice. Sugar-sweetened soft drinks. Beer. Liquor and spirits.  Fats and oils: Butter. Canola oil. Vegetable oil. Beef fat (tallow). Lard.  Sweets and desserts: Cookies. Cakes. Pies. Candy.  Seasoning and other foods: Mayonnaise. Premade sauces and marinades. The items listed may not be a complete list. Talk with your dietitian about what dietary choices are right for you. Summary  The Mediterranean diet includes both food and lifestyle choices.  Eat a variety of fresh fruits and vegetables, beans, nuts, seeds, and whole grains.  Limit the amount of red meat and sweets that you eat.  Talk with your health care provider about whether it is safe for you to drink red wine in moderation. This means 1 glass a day for nonpregnant women and 2 glasses a day for men. A glass of wine equals 5 oz (150 mL). This information  is not intended to replace advice given to you by your health care provider. Make sure you discuss any questions you have with your health care provider. Document Revised: 08/30/2015 Document Reviewed: 08/23/2015 Elsevier Patient Education  2020 [employer]  

## 2019-10-27 NOTE — Assessment & Plan Note (Signed)
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 On exam she does have positive Tinel's sign bilaterally and suggestive symptoms and signs. Her last injection was over a year ago, and she is already doing night splinting. We performed bilateral median nerve Hydro dissections today. Return to see me in 1 month.

## 2019-10-27 NOTE — Progress Notes (Signed)
    Procedures performed today:    Procedure: Real-time Ultrasound Guided hydrodissection of the right median nerve at the carpal tunnel Device: Samsung HS60 Verbal informed consent obtained.  Time-out conducted.  Noted no overlying erythema, induration, or other signs of local infection.  Skin prepped in a sterile fashion.  Local anesthesia: Topical Ethyl chloride.  With sterile technique and under real time ultrasound guidance: Using a 25-gauge needle advanced into the carpal tunnel, noted bifid median nerve, taking care to avoid intraneural injection I injected medication both superficial to and deep to the median nerve freeing it from surrounding structures, I then redirected the needle deep and injected further medication around the flexor tendons deep within the carpal tunnel for a total of 1 cc kenalog 40, 5 cc 1% lidocaine without epinephrine. Completed without difficulty  Advised to call if fevers/chills, erythema, induration, drainage, or persistent bleeding.  Images permanently stored and available for review in PACS.  Impression: Technically successful ultrasound guided median nerve hydrodissection.  Procedure: Real-time Ultrasound Guided hydrodissection of the left median nerve at the carpal tunnel Device: Samsung HS60 Verbal informed consent obtained.  Time-out conducted.  Noted no overlying erythema, induration, or other signs of local infection.  Skin prepped in a sterile fashion.  Local anesthesia: Topical Ethyl chloride.  With sterile technique and under real time ultrasound guidance: Using a 25-gauge needle advanced into the carpal tunnel, taking care to avoid intraneural injection I injected medication both superficial to and deep to the median nerve freeing it from surrounding structures, I then redirected the needle deep and injected further medication around the flexor tendons deep within the carpal tunnel for a total of 1 cc kenalog 40, 5 cc 1% lidocaine without  epinephrine. Completed without difficulty  Advised to call if fevers/chills, erythema, induration, drainage, or persistent bleeding.  Images permanently stored and available for review in PACS.  Impression: Technically successful ultrasound guided median nerve hydrodissection.  Independent interpretation of notes and tests performed by another provider:   None.  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 On exam she does have positive Tinel's sign bilaterally and suggestive symptoms and signs. Her last injection was over a year ago, and she is already doing night splinting. We performed bilateral median nerve Hydro dissections today. Return to see me in 1 month.  Primary osteoarthritis of both first carpometacarpal joints Martha Woods also has bilateral thumb basal joint pain, this is likely CMC arthritis, there is typical squaring off of the base of the first metacarpals. She will likely get some improvement from her carpal tunnel injections but if insufficient improvement we can certainly inject her CMC joints at the 1 month follow-up. Adding x-rays, she does take naproxen daily, I have advised her to do some omeprazole as well. Calling this in.    ___________________________________________ Gwen Her. Dianah Field, M.D., ABFM., CAQSM. Primary Care and [place] [employer] of [place] [facility]

## 2019-10-27 NOTE — Assessment & Plan Note (Signed)
Martha Woods also has bilateral thumb basal joint pain, this is likely CMC arthritis, there is typical squaring off of the base of the first metacarpals. She will likely get some improvement from her carpal tunnel injections but if insufficient improvement we can certainly inject her CMC joints at the 1 month follow-up. Adding x-rays, she does take naproxen daily, I have advised her to do some omeprazole as well. Calling this in.

## 2019-11-24 ENCOUNTER — Ambulatory Visit (INDEPENDENT_AMBULATORY_CARE_PROVIDER_SITE_OTHER): Payer: 59 | Admitting: Sports Medicine

## 2019-11-24 ENCOUNTER — Encounter: Payer: Self-pay | Admitting: Sports Medicine

## 2019-11-24 ENCOUNTER — Other Ambulatory Visit: Payer: Self-pay

## 2019-11-24 DIAGNOSIS — R252 Cramp and spasm: Secondary | ICD-10-CM | POA: Insufficient documentation

## 2019-11-24 DIAGNOSIS — G5603 Carpal tunnel syndrome, bilateral upper limbs: Secondary | ICD-10-CM | POA: Diagnosis not present

## 2019-11-24 MED ORDER — MAGNESIUM OXIDE 400 MG PO TABS: 800.0000 mg | ORAL_TABLET | Freq: Every day | ORAL | 3 refills | Status: DC

## 2019-11-24 NOTE — Progress Notes (Signed)
    Procedures performed today:    None.  Independent interpretation of notes and tests performed by another provider:   None.  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  Nocturnal cramping Also mentions that she has been getting cramping in Woods hands and feet, predominantly at night but also sometimes during the day. No changes in diet, not on any diuretics, adding magnesium oxide 800 mg nightly, she can go up to 800 mg twice daily if needed after a few weeks. Woods PCP is to be checking electrolytes, I really do not think we need to check Woods magnesium level since we are supplementing. I also do not expect to see any potassium abnormalities since she is now on a diuretic. We can revisit this in a month and see how things are going, ultimately if continues we will investigate Woods lumbar spine and potentially get a nerve conduction/EMG.    ___________________________________________ Martha Woods. Martha Woods, M.D., ABFM., CAQSM. Primary Care and [place] Instructor of [place] [facility]

## 2019-11-24 NOTE — Assessment & Plan Note (Signed)
Also mentions that she has been getting cramping in her hands and feet, predominantly at night but also sometimes during the day. No changes in diet, not on any diuretics, adding magnesium oxide 800 mg nightly, she can go up to 800 mg twice daily if needed after a few weeks. Her PCP is to be checking electrolytes, I really do not think we need to check her magnesium level since we are supplementing. I also do not expect to see any potassium abnormalities since she is now on a diuretic. We can revisit this in a month and see how things are going, ultimately if continues we will investigate her lumbar spine and potentially get a nerve conduction/EMG.

## 2019-11-24 NOTE — Assessment & Plan Note (Signed)
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

## 2019-11-25 LAB — BASIC METABOLIC PANEL WITH GFR
BUN/Creatinine Ratio: 26 (calc) — ABNORMAL HIGH (ref 6–22)
BUN: 27 mg/dL — ABNORMAL HIGH (ref 7–25)
CO2: 28 mmol/L (ref 20–32)
Calcium: 8.9 mg/dL (ref 8.6–10.4)
Chloride: 99 mmol/L (ref 98–110)
Creat: 1.04 mg/dL — ABNORMAL HIGH (ref 0.50–0.99)
GFR, Est African American: 68 mL/min/{1.73_m2} (ref >=60)
GFR, Est Non African American: 58 mL/min/{1.73_m2} — ABNORMAL LOW (ref >=60)
Glucose, Bld: 122 mg/dL — ABNORMAL HIGH (ref 65–99)
Potassium: 4.6 mmol/L (ref 3.5–5.3)
Sodium: 137 mmol/L (ref 135–146)

## 2019-11-25 LAB — TSH+FREE T4: TSH W/REFLEX TO FT4: 6.43 mIU/L — ABNORMAL HIGH (ref 0.40–4.50)

## 2019-11-25 LAB — T4, FREE: Free T4: 1.6 ng/dL (ref 0.8–1.8)

## 2019-11-29 ENCOUNTER — Other Ambulatory Visit: Payer: Self-pay | Admitting: *Deleted

## 2019-11-29 ENCOUNTER — Encounter: Payer: Self-pay | Admitting: Family Medicine

## 2019-11-29 DIAGNOSIS — E890 Postprocedural hypothyroidism: Secondary | ICD-10-CM

## 2019-12-06 ENCOUNTER — Other Ambulatory Visit: Payer: Self-pay

## 2019-12-06 MED ORDER — LEVOTHYROXINE SODIUM 175 MCG PO TABS
175.0000 ug | ORAL_TABLET | Freq: Every day | ORAL | 1 refills | Status: DC
Start: 2019-12-06 — End: 2019-12-20

## 2019-12-06 NOTE — Telephone Encounter (Signed)
Arica called for a refill on Levothyroxine. Confirmed dose of Levothyroxine 175 mcg once daily. Historical prescription.

## 2019-12-19 ENCOUNTER — Encounter: Payer: Self-pay | Admitting: Family Medicine

## 2019-12-20 ENCOUNTER — Other Ambulatory Visit: Payer: Self-pay | Admitting: *Deleted

## 2019-12-20 DIAGNOSIS — E890 Postprocedural hypothyroidism: Secondary | ICD-10-CM

## 2019-12-20 MED ORDER — LEVOTHYROXINE SODIUM 175 MCG PO TABS: 175.0000 ug | ORAL_TABLET | Freq: Every day | ORAL | 1 refills | Status: DC

## 2019-12-22 ENCOUNTER — Ambulatory Visit: Payer: 59 | Admitting: Sports Medicine

## 2020-01-17 ENCOUNTER — Encounter: Payer: Self-pay | Admitting: Family Medicine

## 2020-01-17 ENCOUNTER — Telehealth (INDEPENDENT_AMBULATORY_CARE_PROVIDER_SITE_OTHER): Payer: 59 | Admitting: Family Medicine

## 2020-01-17 DIAGNOSIS — J019 Acute sinusitis, unspecified: Secondary | ICD-10-CM | POA: Diagnosis not present

## 2020-01-17 MED ORDER — AZITHROMYCIN 250 MG PO TABS: ORAL_TABLET | ORAL | 0 refills | Status: AC

## 2020-01-17 NOTE — Progress Notes (Signed)
Pt reports that her sxs began last Monday (1 wk ago). She said that her daughter got married on Thursday and that one of her cousins boyfriend that attended the wedding tested positive for covid. Then her niece tested on the 31st she was positive and her daughter tested yesterday and her test came back positive. She said that when she took her test last Thursday it was negative. However, she has had coughing with chest discomfort,runny nose head and body aches, can't taste food, extreme fatigue (this has since gone away). She has been taking robitussin,and took sudafed last night. She denies any f/s/c/n/v.   She did inform me that she is going to her office today to have another rapid test done and she has an appointment on Thursday to have the pcr test done.      Spoke with pt again prior to visit and she informed me that the rapid test was negative also.

## 2020-01-17 NOTE — Progress Notes (Signed)
Virtual Visit via Video Note  I connected with Martha Woods on 01/17/20 at  4:00 PM EST by a video enabled telemedicine application and verified that I am speaking with the correct person using two identifiers.   I discussed the limitations of evaluation and management by telemedicine and the availability of in person appointments. The patient expressed understanding and agreed to proceed.  Patient location: at home.   Provider location: in office  Subjective:    CC:   HPI: Pt reports that her sxs began last Monday (1 wk ago). She said that her daughter got married on Thursday and that one of her cousins boyfriend that attended the wedding tested positive for covid. Then her niece tested on the 31st she was positive and her daughter tested yesterday and her test came back positive. She said that when she took her test last Thursday it was negative. However, she has had coughing with chest discomfort,runny nose head and body aches, can't taste food, extreme fatigue (this has since gone away). She has been taking robitussin,and took sudafed last night. She denies any f/s/c/n/v. Using Sudafed and drinking tea.  Cough is productive at night.  She did inform me that she is going to her office today to have another rapid test done and she has an appointment on Thursday to have the pcr test done.  No ear pain. No ST  No facial pain.   Spoke with pt again prior to visit and she informed me that the rapid test was negative also. she had negative test.   Started with nasal congestion and fatigue. Slept excessively for a day.     Past medical history, Surgical history, Family history not pertinant except as noted below, Social history, Allergies, and medications have been entered into the medical record, reviewed, and corrections made.   Review of Systems: No fevers, chills, night sweats, weight loss, chest pain, or shortness of breath.   Objective:    General: Speaking clearly in  complete sentences without any shortness of breath.  Alert and oriented x3.  Normal judgment. No apparent acute distress.    Impression and Recommendations:    No problem-specific Assessment & Plan notes found for this encounter.  Acute sinusitis - will tx with azithro.  sxs x 9 days at this point. Call if not better in one week. 2 rapid COVID tests.   COVID exposure - she has PCR scheduled for Thursday. I think a good idea to keep this appt.      Time spent in encounter 16 minutes  I discussed the assessment and treatment plan with the patient. The patient was provided an opportunity to ask questions and all were answered. The patient agreed with the plan and demonstrated an understanding of the instructions.   The patient was advised to call back or seek an in-person evaluation if the symptoms worsen or if the condition fails to improve as anticipated.   Nani Gasser, MD

## 2020-01-19 ENCOUNTER — Ambulatory Visit: Payer: 59 | Admitting: Family Medicine

## 2020-03-05 ENCOUNTER — Ambulatory Visit (INDEPENDENT_AMBULATORY_CARE_PROVIDER_SITE_OTHER): Payer: 59 | Admitting: Family Medicine

## 2020-03-05 ENCOUNTER — Encounter: Payer: Self-pay | Admitting: Family Medicine

## 2020-03-05 ENCOUNTER — Other Ambulatory Visit: Payer: Self-pay

## 2020-03-05 VITALS — BP 133/70 | HR 82 | Ht 63.0 in | Wt 237.0 lb

## 2020-03-05 DIAGNOSIS — M25512 Pain in left shoulder: Secondary | ICD-10-CM | POA: Diagnosis not present

## 2020-03-05 DIAGNOSIS — M7712 Lateral epicondylitis, left elbow: Secondary | ICD-10-CM

## 2020-03-05 DIAGNOSIS — M25511 Pain in right shoulder: Secondary | ICD-10-CM

## 2020-03-05 DIAGNOSIS — G5603 Carpal tunnel syndrome, bilateral upper limbs: Secondary | ICD-10-CM | POA: Diagnosis not present

## 2020-03-05 MED ORDER — PREDNISONE 20 MG PO TABS: ORAL_TABLET | ORAL | 0 refills | Status: AC

## 2020-03-05 NOTE — Progress Notes (Signed)
Left shoulder pain Right arm pain radiates to elbow with certain positions hurt Bilateral hands swelling   Itook on Friday buprofen and wearing splints

## 2020-03-05 NOTE — Progress Notes (Signed)
Established Patient Office Visit  Subjective:  Patient ID: Martha Woods, female    DOB: [birth year]  Age: 60 y.o. MRN: [id number]  CC:  Chief Complaint  Patient presents with  . Shoulder Pain    HPI Martha Woods presents for bilatera shoulder pain, worse on the left.  She is had problems on and off over the years starting back in the 90s she actually did physical therapy in the 90s she has had some injections.  She has a history of old clavicle fracture on the right side.  Most of her pain is over the anterior shoulders bilaterally it is worse when she does anything where she is reaching greater than 90 degrees.  She also reports pain radiating from her shoulder down to the left lateral elbow which then radiates down to her hands.  Also c/o of bilateral hand swelling that she noticed on Friday.  Took some IBU.  She does have splints as well.  In fact she seen Dr. Dianah Field our sports medicine doctor back in November for bilateral carpal tunnel syndrome.  He had injections performed in November.  Her hands have been going numb again in general. She has started wearing her splints again.   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   Past Medical History:  Diagnosis Date  . Abnormal Pap smear    cervical cryo  . Cancer [facility]) 02-2009   papillary thyroid cancer, Dr. Malachy Mood  . Cervical spondylosis   . Diabetes mellitus   . Hyperlipidemia   . Hypertension   . Lumbar disc herniation   . Lumbar spondylosis   . Seborrheic dermatitis     Past Surgical History:  Procedure Laterality Date  . ett     neg for ischemia -7 mets pelvic u/s normal  . plantar fascitis  01-2010   L (Dr. Wardell Honour)  . REDUCTION MAMMAPLASTY Bilateral    sept 2019  . REPLACEMENT TOTAL KNEE Right 2019  . REPLACEMENT TOTAL KNEE Left 11/42019   Dr. Tacey Ruiz  . TOTAL THYROIDECTOMY  02-2009   thyroid cancer    Family History  Problem Relation Age of Onset  . Diabetes Mother   .  Diabetes Brother   . Hypertension Brother   . Cancer Maternal Grandmother        ovarian cancer    Social History   Socioeconomic History  . Marital status: Married    Spouse name: Not on file  . Number of children: Not on file  . Years of education: Not on file  . Highest education level: Not on file  Occupational History  . Not on file  Tobacco Use  . Smoking status: Never Smoker  . Smokeless tobacco: Never Used  Substance and Sexual Activity  . Alcohol use: Yes  . Drug use: No  . Sexual activity: Not on file  Other Topics Concern  . Not on file  Social History Narrative  . Not on file   Social Determinants of Health   Financial Resource Strain: Not on file  Food Insecurity: Not on file  Transportation Needs: Not on file  Physical Activity: Not on file  Stress: Not on file  Social Connections: Not on file  Intimate Partner Violence: Not on file    Outpatient Medications Prior to Visit  Medication Sig Dispense Refill  . aspirin 81 MG tablet Take 81 mg by mouth daily.    . Cholecalciferol (VITAMIN D) 2000 UNITS tablet Take 2,000 Units by mouth.    Marland Kitchen  levothyroxine (SYNTHROID) 175 MCG tablet Take 1 tablet (175 mcg total) by mouth daily before breakfast. 90 tablet 1  . lisinopril (ZESTRIL) 40 MG tablet Take 1 tablet (40 mg total) by mouth daily. 90 tablet 3  . lovastatin (MEVACOR) 20 MG tablet TAKE 1 TABLET AT BEDTIME 90 tablet 3  . omeprazole (PRILOSEC) 20 MG capsule Take 1 capsule (20 mg total) by mouth daily. Take with NSAIDs 30 capsule 11  . rizatriptan (MAXALT-MLT) 10 MG disintegrating tablet Take 1 tablet (10 mg total) by mouth as needed. May repeat in 2 hours if needed 30 tablet 4  . sitaGLIPtin-metformin (JANUMET) 50-1000 MG tablet TAKE 1 TABLET BY MOUTH TWO  TIMES DAILY WITH MEALS 180 tablet 3  . traMADol (ULTRAM) 50 MG tablet Take 1 tablet (50 mg total) by mouth every 6 (six) hours as needed. 90 tablet 0  . Travoprost, BAK Free, (TRAVATAN) 0.004 % SOLN  ophthalmic solution Place 1 drop into both eyes every evening.     No facility-administered medications prior to visit.    Allergies  Allergen Reactions  . Cyclobenzaprine Other (See Comments)    Other reaction(s): Other "Made me sleep all the time." "Made me sleep all the time."   . Glipizide Other (See Comments)    Hypoglycemia  . Phentermine Other (See Comments)    palpitations  . Topamax [Topiramate] Other (See Comments)    Mood change, depression     ROS Review of Systems    Objective:    Physical Exam  BP 133/70   Pulse 82   Ht 5\' 3"  (1.6 m)   Wt 237 lb (107.5 kg)   LMP 12/26/2011   SpO2 98%   BMI 41.98 kg/m  Wt Readings from Last 3 Encounters:  03/05/20 237 lb (107.5 kg)  11/24/19 230 lb (104.3 kg)  10/13/19 241 lb (109.3 kg)     Health Maintenance Due  Topic Date Due  . OPHTHALMOLOGY EXAM  06/02/2018  . COVID-19 Vaccine (3 - Inadvertent risk 4-dose series) 03/23/2019    There are no preventive care reminders to display for this patient.  Lab Results  Component Value Date   TSH 4.013 09/19/2008   Lab Results  Component Value Date   WBC 6.3 03/23/2019   HGB 11.4 (L) 03/23/2019   HCT 37.1 03/23/2019   MCV 73.2 (L) 03/23/2019   PLT 317 03/23/2019   Lab Results  Component Value Date   NA 137 11/24/2019   K 4.6 11/24/2019   CO2 28 11/24/2019   GLUCOSE 122 (H) 11/24/2019   BUN 27 (H) 11/24/2019   CREATININE 1.04 (H) 11/24/2019   BILITOT 0.4 03/23/2019   ALKPHOS 75 04/29/2017   AST 15 03/23/2019   ALT 16 03/23/2019   PROT 6.7 03/23/2019   ALBUMIN 3.8 01/23/2015   CALCIUM 8.9 11/24/2019   Lab Results  Component Value Date   CHOL 165 03/23/2019   Lab Results  Component Value Date   HDL 58 03/23/2019   Lab Results  Component Value Date   LDLCALC 89 03/23/2019   Lab Results  Component Value Date   TRIG 85 03/23/2019   Lab Results  Component Value Date   CHOLHDL 2.8 03/23/2019   Lab Results  Component Value Date   HGBA1C  7.3 (A) 10/13/2019      Assessment & Plan:   Problem List Items Addressed This Visit      Nervous and Auditory   Carpal tunnel syndrome, bilateral     Other  Acute pain of both shoulders - Primary    Other Visit Diagnoses    Left lateral epicondylitis       Relevant Medications   predniSONE (DELTASONE) 20 MG tablet      Shoulder pain most consistent with AC joint inflammation.  Given handout with exercises to do on her own at home offered formal physical therapy she will consider it.  She would like to try a round of prednisone if possible.  Reminded her not to take it with NSAIDs but she can start ibuprofen after she is done with the prednisone.  Continue to work on icing the areas as needed.  Epicondylitis, lateral left-given handout on stretches to do on her own at home.  Avoid heavy lifting with that arm.  Again she wants to try prednisone.  Recommend more formal PT if not improving.   Bilateral carpal tunnel continue to wear splints.  PT does not really tend to help with carpal tunnel so we did discuss maybe returning for injections again if needed in about 2 months and or considering surgery since she is actually had multiple injections over the years for her wrists.  Meds ordered this encounter  Medications  . predniSONE (DELTASONE) 20 MG tablet    Sig: Take 2 tablets (40 mg total) by mouth daily with breakfast for 4 days, THEN 1 tablet (20 mg total) daily with breakfast for 2 days.    Dispense:  10 tablet    Refill:  0    Follow-up: Return if symptoms worsen or fail to improve.    Beatrice Lecher, MD

## 2020-03-19 ENCOUNTER — Ambulatory Visit (INDEPENDENT_AMBULATORY_CARE_PROVIDER_SITE_OTHER): Payer: 59

## 2020-03-19 ENCOUNTER — Other Ambulatory Visit: Payer: Self-pay

## 2020-03-19 ENCOUNTER — Ambulatory Visit (INDEPENDENT_AMBULATORY_CARE_PROVIDER_SITE_OTHER): Payer: 59 | Admitting: Sports Medicine

## 2020-03-19 DIAGNOSIS — M7542 Impingement syndrome of left shoulder: Secondary | ICD-10-CM

## 2020-03-19 DIAGNOSIS — M7541 Impingement syndrome of right shoulder: Secondary | ICD-10-CM

## 2020-03-19 NOTE — Progress Notes (Signed)
    Procedures performed today:    Procedure: Real-time Ultrasound Guided injection of the subacromial bursa Device: Samsung HS60  Verbal informed consent obtained.  Time-out conducted.  Noted no overlying erythema, induration, or other signs of local infection.  Skin prepped in a sterile fashion.  Local anesthesia: Topical Ethyl chloride.  With sterile technique and under real time ultrasound guidance:  Noted intact rotator cuff, 1 cc Kenalog 40, 1 cc lidocaine, 1 cc bupivacaine injected easily Completed without difficulty  Advised to call if fevers/chills, erythema, induration, drainage, or persistent bleeding.  Images permanently stored and available for review in PACS.  Impression: Technically successful ultrasound guided injection.  Independent interpretation of notes and tests performed by another provider:   None.  Brief History, Exam, Impression, and Recommendations:    Impingement syndrome of both shoulders This is a pleasant 60 year old female, she has years long history of bilateral shoulder pain, left worse than right, she had an injection in 2007 and another one in 2011, both provided fantastic relief, on exam she has impingement signs with a positive Neer's, Hawkins, empty can signs, really no bicipital signs or glenohumeral signs. Per her request we did a subacromial injection today on the left, and if this works well she will return for the right side. Rotator cuff rehab exercises given to do in the meantime, I would like some baseline x-rays of both her shoulders and she can return to see me in 6 weeks.    ___________________________________________ Gwen Her. Dianah Field, M.D., ABFM., CAQSM. Primary Care and [place] In[employer] of [place] [facility]

## 2020-03-19 NOTE — Assessment & Plan Note (Signed)
This is a pleasant 60 year old female, she has years long history of bilateral shoulder pain, left worse than right, she had an injection in 2007 and another one in 2011, both provided fantastic relief, on exam she has impingement signs with a positive Neer's, Hawkins, empty can signs, really no bicipital signs or glenohumeral signs. Per her request we did a subacromial injection today on the left, and if this works well she will return for the right side. Rotator cuff rehab exercises given to do in the meantime, I would like some baseline x-rays of both her shoulders and she can return to see me in 6 weeks.

## 2020-03-27 ENCOUNTER — Ambulatory Visit: Payer: 59 | Admitting: Family Medicine

## 2020-04-30 ENCOUNTER — Ambulatory Visit: Payer: 59 | Admitting: Sports Medicine

## 2020-05-22 ENCOUNTER — Other Ambulatory Visit: Payer: Self-pay | Admitting: *Deleted

## 2020-05-22 DIAGNOSIS — E890 Postprocedural hypothyroidism: Secondary | ICD-10-CM

## 2020-05-22 MED ORDER — LEVOTHYROXINE SODIUM 175 MCG PO TABS: 175.0000 ug | ORAL_TABLET | Freq: Every day | ORAL | 1 refills | Status: DC

## 2020-06-25 ENCOUNTER — Encounter: Payer: Self-pay | Admitting: Family Medicine

## 2020-06-25 ENCOUNTER — Ambulatory Visit (INDEPENDENT_AMBULATORY_CARE_PROVIDER_SITE_OTHER): Payer: 59 | Admitting: Family Medicine

## 2020-06-25 ENCOUNTER — Other Ambulatory Visit: Payer: Self-pay | Admitting: Family Medicine

## 2020-06-25 ENCOUNTER — Other Ambulatory Visit: Payer: Self-pay

## 2020-06-25 VITALS — BP 132/70 | HR 84 | Ht 63.25 in | Wt 236.0 lb

## 2020-06-25 DIAGNOSIS — E119 Type 2 diabetes mellitus without complications: Secondary | ICD-10-CM | POA: Diagnosis not present

## 2020-06-25 DIAGNOSIS — Z1231 Encounter for screening mammogram for malignant neoplasm of breast: Secondary | ICD-10-CM

## 2020-06-25 DIAGNOSIS — M180 Bilateral primary osteoarthritis of first carpometacarpal joints: Secondary | ICD-10-CM

## 2020-06-25 DIAGNOSIS — I10 Essential (primary) hypertension: Secondary | ICD-10-CM

## 2020-06-25 DIAGNOSIS — R0789 Other chest pain: Secondary | ICD-10-CM

## 2020-06-25 DIAGNOSIS — E890 Postprocedural hypothyroidism: Secondary | ICD-10-CM

## 2020-06-25 DIAGNOSIS — E7800 Pure hypercholesterolemia, unspecified: Secondary | ICD-10-CM

## 2020-06-25 DIAGNOSIS — R1013 Epigastric pain: Secondary | ICD-10-CM

## 2020-06-25 LAB — POCT GLYCOSYLATED HEMOGLOBIN (HGB A1C): Hemoglobin A1C: 7.6 % — AB (ref 4.0–5.6)

## 2020-06-25 MED ORDER — OMEPRAZOLE 20 MG PO CPDR: 20.0000 mg | DELAYED_RELEASE_CAPSULE | Freq: Every day | ORAL | 1 refills | Status: DC

## 2020-06-25 NOTE — Assessment & Plan Note (Signed)
Plan to recheck TSH today.  We can make any adjustments needed.

## 2020-06-25 NOTE — Assessment & Plan Note (Signed)
Due to recheck lipids. 

## 2020-06-25 NOTE — Assessment & Plan Note (Signed)
Hemoglobin A1c uncontrolled today at 7.6.  She also has had multiple steroid injections and thinks that could be part of the cause.  We did discuss actually changing up her medication today.  I think she would benefit to switch to something like Wilder Glade which would be a little bit more powerful than the sitagliptin and, in combination with the metformin.  She says she wants to think about it.  Otherwise plan to follow-up in 3 months.

## 2020-06-25 NOTE — Assessment & Plan Note (Signed)
Well controlled. Continue current regimen. Follow up in  6 mo  

## 2020-06-25 NOTE — Progress Notes (Signed)
Established Patient Office Visit  Subjective:  Patient ID: Martha Woods, female    DOB: March 16, 1960  Age: 60 y.o. MRN: [id number]  CC:  Chief Complaint  Patient presents with   Diabetes    HPI Martha Woods presents for  ] Diabetes - no hypoglycemic events. No wounds or sores that are not healing well. No increased thirst or urination. Checking glucose at home. Taking medications as prescribed without any side effects.  Hypertension- Pt denies chest pain, SOB, dizziness, or heart palpitations.  Taking meds as directed w/o problems.  Denies medication side effects.    Hypothyroidism - Taking medication regularly in the AM away from food and vitamins, etc. No recent change to skin, hair, or energy levels.  Feels like something is off and like it could be her thyroid.  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  It lasted a couple of hours.  And then eventually went away she has had more minor episodes similar to that but much more brief and less intense but those seem to have been associated with food.  The of the initial episode she actually had not eaten anything.  She says it similar to some chest pain that she had back in 2018 when she was in [place] and then came back and had a nuclear stress test that was negative.  Last time she had epigastric pain was about a month ago.    Past Medical History:  Diagnosis Date   Abnormal Pap smear    cervical cryo   Cancer ([place]) 02-2009   papillary thyroid cancer, Dr. Malachy Mood   Cervical spondylosis    Diabetes mellitus    Hyperlipidemia    Hypertension    Lumbar disc herniation    Lumbar spondylosis    Seborrheic dermatitis     Past Surgical History:  Procedure Laterality Date   ett     neg for ischemia -7 mets pelvic u/s normal    plantar fascitis  01-2010   L (Dr. Wardell Honour)   REDUCTION MAMMAPLASTY Bilateral    sept 2019   REPLACEMENT TOTAL KNEE Right 2019   REPLACEMENT TOTAL KNEE Left 11/42019   Dr. Tacey Ruiz   TOTAL THYROIDECTOMY  02-2009   thyroid cancer    Family History  Problem Relation Age of Onset   Diabetes Mother    Diabetes Brother    Hypertension Brother    Cancer Maternal Grandmother        ovarian cancer    Social History   Socioeconomic History   Marital status: Married    Spouse name: Not on file   Number of children: Not on file   Years of education: Not on file   Highest education level: Not on file  Occupational History   Not on file  Tobacco Use   Smoking status: Never   Smokeless tobacco: Never  Substance and Sexual Activity   Alcohol use: Yes   Drug use: No   Sexual activity: Not on file  Other Topics Concern   Not on file  Social History Narrative   Not on file   Social Determinants of Health   Financial Resource Strain: Not on file  Food Insecurity: Not on file  Transportation Needs: Not on file  Physical Activity: Not on file  Stress: Not on  file  Social Connections: Not on file  Intimate Partner Violence: Not on file    Outpatient Medications Prior to Visit  Medication Sig Dispense Refill   aspirin 81 MG tablet Take 81 mg by mouth daily.     Cholecalciferol (VITAMIN D) 2000 UNITS tablet Take 2,000 Units by mouth.     levothyroxine (SYNTHROID) 175 MCG tablet Take 1 tablet (175 mcg total) by mouth daily before breakfast. 90 tablet 1   lisinopril (ZESTRIL) 40 MG tablet Take 1 tablet (40 mg total) by mouth daily. 90 tablet 3   lovastatin (MEVACOR) 20 MG tablet TAKE 1 TABLET AT BEDTIME 90 tablet 3   sitaGLIPtin-metformin (JANUMET) 50-1000 MG tablet TAKE 1 TABLET BY MOUTH TWO  TIMES DAILY WITH MEALS 180 tablet 3   traMADol (ULTRAM) 50 MG tablet Take 1 tablet (50 mg total) by mouth every 6 (six) hours as needed. 90 tablet 0   Travoprost, BAK Free, (TRAVATAN)  0.004 % SOLN ophthalmic solution Place 1 drop into both eyes every evening.     omeprazole (PRILOSEC) 20 MG capsule Take 1 capsule (20 mg total) by mouth daily. Take with NSAIDs 30 capsule 11   No facility-administered medications prior to visit.    Allergies  Allergen Reactions   Cyclobenzaprine Other (See Comments)    Other reaction(s): Other "Made me sleep all the time." "Made me sleep all the time."    Glipizide Other (See Comments)    Hypoglycemia   Phentermine Other (See Comments)    palpitations   Topamax [Topiramate] Other (See Comments)    Mood change, depression     ROS Review of Systems    Objective:    Physical Exam Constitutional:      Appearance: She is well-developed.  HENT:     Head: Normocephalic and atraumatic.  Cardiovascular:     Rate and Rhythm: Normal rate and regular rhythm.     Heart sounds: Normal heart sounds.  Pulmonary:     Effort: Pulmonary effort is normal.     Breath sounds: Normal breath sounds.  Skin:    General: Skin is warm and dry.  Neurological:     Mental Status: She is alert and oriented to person, place, and time.  Psychiatric:        Behavior: Behavior normal.    BP 132/70   Pulse 84   Ht 5' 3.25" (1.607 m)   Wt 236 lb (107 kg)   LMP 12/26/2011   SpO2 100%   BMI 41.48 kg/m  Wt Readings from Last 3 Encounters:  06/25/20 236 lb (107 kg)  03/05/20 237 lb (107.5 kg)  11/24/19 230 lb (104.3 kg)     Health Maintenance Due  Topic Date Due   OPHTHALMOLOGY EXAM  06/02/2018   COVID-19 Vaccine (3 - Mixed Product risk series) 03/23/2019   PAP SMEAR-Modifier  07/23/2020    There are no preventive care reminders to display for this patient.  Lab Results  Component Value Date   TSH 4.013 09/19/2008   Lab Results  Component Value Date   WBC 6.3 03/23/2019   HGB 11.4 (L) 03/23/2019   HCT 37.1 03/23/2019   MCV 73.2 (L) 03/23/2019   PLT 317 03/23/2019   Lab Results  Component Value Date   NA 137 11/24/2019   K  4.6 11/24/2019   CO2 28 11/24/2019   GLUCOSE 122 (H) 11/24/2019   BUN 27 (H) 11/24/2019   CREATININE 1.04 (H) 11/24/2019   BILITOT 0.4 03/23/2019   ALKPHOS  75 04/29/2017   AST 15 03/23/2019   ALT 16 03/23/2019   PROT 6.7 03/23/2019   ALBUMIN 3.8 01/23/2015   CALCIUM 8.9 11/24/2019   Lab Results  Component Value Date   CHOL 165 03/23/2019   Lab Results  Component Value Date   HDL 58 03/23/2019   Lab Results  Component Value Date   LDLCALC 89 03/23/2019   Lab Results  Component Value Date   TRIG 85 03/23/2019   Lab Results  Component Value Date   CHOLHDL 2.8 03/23/2019   Lab Results  Component Value Date   HGBA1C 7.6 (A) 06/25/2020      Assessment & Plan:   Problem List Items Addressed This Visit       Cardiovascular and Mediastinum   HYPERTENSION, BENIGN SYSTEMIC    Well controlled. Continue current regimen. Follow up in  6 mo        Relevant Orders   POCT glycosylated hemoglobin (Hb A1C) (Completed)   TSH   Lipid Panel w/reflex Direct LDL   COMPLETE METABOLIC PANEL WITH GFR   CBC     Endocrine   Type 2 diabetes mellitus without complication, without long-term current use of insulin (HCC) - Primary    Hemoglobin A1c uncontrolled today at 7.6.  She also has had multiple steroid injections and thinks that could be part of the cause.  We did discuss actually changing up her medication today.  I think she would benefit to switch to something like Wilder Glade which would be a little bit more powerful than the sitagliptin and, in combination with the metformin.  She says she wants to think about it.  Otherwise plan to follow-up in 3 months.       Relevant Orders   POCT glycosylated hemoglobin (Hb A1C) (Completed)   TSH   Lipid Panel w/reflex Direct LDL   COMPLETE METABOLIC PANEL WITH GFR   CBC   Post-surgical hypothyroidism    Plan to recheck TSH today.  We can make any adjustments needed.         Musculoskeletal and Integument   Primary osteoarthritis  of both first carpometacarpal joints   Relevant Medications   omeprazole (PRILOSEC) 20 MG capsule     Other   Hyperlipidemia    Due to recheck lipids.       Relevant Orders   POCT glycosylated hemoglobin (Hb A1C) (Completed)   TSH   Lipid Panel w/reflex Direct LDL   COMPLETE METABOLIC PANEL WITH GFR   CBC   Other Visit Diagnoses     Atypical chest pain       Relevant Orders   EKG 12-Lead   Epigastric abdominal pain           Epigastric pain/Atypical chest pain.  We will repeat EKG today.  We will also get some up-to-date blood work.  Consider it could be gastritis versus GERD recommend a trial of putting her on the PPI daily for the next 6 weeks.  Consider cardiac related that she did have a negative stress test about 4 years ago.  Also consider cholecystitis.  Though again she has not had a repeat episode in about a month.  If occurs again then please let us know and we can work-up further.  EKG does rate of 76 bpm, normal sinus rhythm with no acute ST-T wave changes.  She does have a little low voltage QRS in the lateral leads.  Unchanged from previous of 2018.    Reminded her that she  is coming up due for her Pap smear.  Meds ordered this encounter  Medications   omeprazole (PRILOSEC) 20 MG capsule    Sig: Take 1 capsule (20 mg total) by mouth daily.    Dispense:  90 capsule    Refill:  1     Follow-up: Return in about 3 months (around 09/25/2020) for Diabetes follow-up.    Beatrice Lecher, MD

## 2020-06-25 NOTE — Patient Instructions (Signed)
Could consider taking the omeprazole daily for 6 weeks. That is long enough to heal gastritis or an ulcer.

## 2020-06-28 ENCOUNTER — Other Ambulatory Visit: Payer: Self-pay

## 2020-06-28 ENCOUNTER — Ambulatory Visit (INDEPENDENT_AMBULATORY_CARE_PROVIDER_SITE_OTHER): Payer: 59

## 2020-06-28 DIAGNOSIS — Z1231 Encounter for screening mammogram for malignant neoplasm of breast: Secondary | ICD-10-CM

## 2020-06-28 LAB — LIPID PANEL W/REFLEX DIRECT LDL
Cholesterol: 150 mg/dL (ref <200)
HDL: 58 mg/dL (ref >=50)
LDL Cholesterol (Calc): 76 mg/dL (calc)
Non-HDL Cholesterol (Calc): 92 mg/dL (calc) (ref <130)
Total CHOL/HDL Ratio: 2.6 (calc) (ref <5.0)
Triglycerides: 77 mg/dL (ref <150)

## 2020-06-28 LAB — CBC
HCT: 36.1 % (ref 35.0–45.0)
Hemoglobin: 11 g/dL — ABNORMAL LOW (ref 11.7–15.5)
MCH: 22 pg — ABNORMAL LOW (ref 27.0–33.0)
MCHC: 30.5 g/dL — ABNORMAL LOW (ref 32.0–36.0)
MCV: 72.1 fL — ABNORMAL LOW (ref 80.0–100.0)
MPV: 10.8 fL (ref 7.5–12.5)
Platelets: 319 10*3/uL (ref 140–400)
RBC: 5.01 10*6/uL (ref 3.80–5.10)
RDW: 15.7 % — ABNORMAL HIGH (ref 11.0–15.0)
WBC: 6 10*3/uL (ref 3.8–10.8)

## 2020-06-28 LAB — COMPLETE METABOLIC PANEL WITH GFR
AG Ratio: 1.3 (calc) (ref 1.0–2.5)
ALT: 16 U/L (ref 6–29)
AST: 16 U/L (ref 10–35)
Albumin: 3.9 g/dL (ref 3.6–5.1)
Alkaline phosphatase (APISO): 58 U/L (ref 37–153)
BUN: 23 mg/dL (ref 7–25)
CO2: 28 mmol/L (ref 20–32)
Calcium: 8.7 mg/dL (ref 8.6–10.4)
Chloride: 102 mmol/L (ref 98–110)
Creat: 0.94 mg/dL (ref 0.50–0.99)
GFR, Est African American: 76 mL/min/{1.73_m2} (ref >=60)
GFR, Est Non African American: 65 mL/min/{1.73_m2} (ref >=60)
Globulin: 3.1 g/dL (calc) (ref 1.9–3.7)
Glucose, Bld: 156 mg/dL — ABNORMAL HIGH (ref 65–99)
Potassium: 4.8 mmol/L (ref 3.5–5.3)
Sodium: 137 mmol/L (ref 135–146)
Total Bilirubin: 0.5 mg/dL (ref 0.2–1.2)
Total Protein: 7 g/dL (ref 6.1–8.1)

## 2020-06-28 LAB — IRON,TIBC AND FERRITIN PANEL
%SAT: 18 % (calc) (ref 16–45)
Ferritin: 18 ng/mL (ref 16–288)
Iron: 66 ug/dL (ref 45–160)
TIBC: 364 mcg/dL (calc) (ref 250–450)

## 2020-06-28 LAB — TEST AUTHORIZATION

## 2020-06-28 LAB — T4, FREE: Free T4: 2 ng/dL — ABNORMAL HIGH (ref 0.8–1.8)

## 2020-06-28 LAB — T3, FREE: T3, Free: 2.9 pg/mL (ref 2.3–4.2)

## 2020-06-28 LAB — TSH: TSH: 16.67 mIU/L — ABNORMAL HIGH (ref 0.40–4.50)

## 2020-07-08 MED ORDER — LEVOTHYROXINE SODIUM 200 MCG PO TABS
200.0000 ug | ORAL_TABLET | Freq: Every day | ORAL | 0 refills | Status: DC
Start: 2020-07-08 — End: 2020-07-26

## 2020-07-08 NOTE — Addendum Note (Signed)
Addended by: Beatrice Lecher D on: 07/08/2020 04:04 PM   Modules accepted: Orders

## 2020-07-09 ENCOUNTER — Other Ambulatory Visit: Payer: Self-pay

## 2020-07-09 DIAGNOSIS — E890 Postprocedural hypothyroidism: Secondary | ICD-10-CM

## 2020-07-26 ENCOUNTER — Other Ambulatory Visit: Payer: Self-pay | Admitting: *Deleted

## 2020-07-26 ENCOUNTER — Other Ambulatory Visit: Payer: Self-pay | Admitting: Family Medicine

## 2020-07-26 ENCOUNTER — Encounter: Payer: Self-pay | Admitting: Family Medicine

## 2020-07-26 DIAGNOSIS — E890 Postprocedural hypothyroidism: Secondary | ICD-10-CM

## 2020-07-26 DIAGNOSIS — M180 Bilateral primary osteoarthritis of first carpometacarpal joints: Secondary | ICD-10-CM

## 2020-07-26 MED ORDER — LOVASTATIN 20 MG PO TABS
20.0000 mg | ORAL_TABLET | Freq: Every day | ORAL | 3 refills | Status: DC
Start: 2020-07-26 — End: 2021-02-06

## 2020-07-26 MED ORDER — OMEPRAZOLE 20 MG PO CPDR: 20.0000 mg | DELAYED_RELEASE_CAPSULE | Freq: Every day | ORAL | 1 refills | Status: DC

## 2020-07-26 MED ORDER — LISINOPRIL 40 MG PO TABS: 40.0000 mg | ORAL_TABLET | Freq: Every day | ORAL | 3 refills | Status: DC

## 2020-07-26 MED ORDER — LEVOTHYROXINE SODIUM 200 MCG PO TABS
200.0000 ug | ORAL_TABLET | Freq: Every day | ORAL | 0 refills | Status: DC
Start: 2020-07-26 — End: 2020-11-19

## 2020-08-08 ENCOUNTER — Other Ambulatory Visit: Payer: Self-pay

## 2020-08-08 ENCOUNTER — Ambulatory Visit (INDEPENDENT_AMBULATORY_CARE_PROVIDER_SITE_OTHER): Payer: 59 | Admitting: Family Medicine

## 2020-08-08 ENCOUNTER — Other Ambulatory Visit (HOSPITAL_COMMUNITY)
Admission: RE | Admit: 2020-08-08 | Discharge: 2020-08-08 | Disposition: A | Discharge status: Home / Self Care | Payer: 59 | Source: Ambulatory Visit | Attending: Family Medicine | Admitting: Family Medicine

## 2020-08-08 ENCOUNTER — Encounter: Payer: Self-pay | Admitting: Family Medicine

## 2020-08-08 VITALS — BP 131/69 | HR 99 | Ht 63.0 in | Wt 237.0 lb

## 2020-08-08 DIAGNOSIS — Z124 Encounter for screening for malignant neoplasm of cervix: Secondary | ICD-10-CM | POA: Diagnosis not present

## 2020-08-08 LAB — T3, FREE: T3, Free: 3.5 pg/mL (ref 2.3–4.2)

## 2020-08-08 LAB — TSH+FREE T4: TSH W/REFLEX TO FT4: 0.51 mIU/L (ref 0.40–4.50)

## 2020-08-08 MED ORDER — JANUMET 50-1000 MG PO TABS: ORAL_TABLET | ORAL | 3 refills | Status: DC

## 2020-08-08 NOTE — Progress Notes (Signed)
Subjective:     Martha Woods is a 60 y.o. female and is here for a comprehensive physical exam. The patient reports no problems.  Social History   Socioeconomic History   Marital status: Married    Spouse name: Not on file   Number of children: Not on file   Years of education: Not on file   Highest education level: Not on file  Occupational History   Not on file  Tobacco Use   Smoking status: Never   Smokeless tobacco: Never  Substance and Sexual Activity   Alcohol use: Yes   Drug use: No   Sexual activity: Not on file  Other Topics Concern   Not on file  Social History Narrative   Not on file   Social Determinants of Health   Financial Resource Strain: Not on file  Food Insecurity: Not on file  Transportation Needs: Not on file  Physical Activity: Not on file  Stress: Not on file  Social Connections: Not on file  Intimate Partner Violence: Not on file   Health Maintenance  Topic Date Due   PAP SMEAR-Modifier  07/23/2020   OPHTHALMOLOGY EXAM  11/11/2020 (Originally 06/02/2018)   COVID-19 Vaccine (3 - Mixed Product risk series) 08/24/2021 (Originally 03/23/2019)   INFLUENZA VACCINE  08/13/2020   FOOT EXAM  10/12/2020   HEMOGLOBIN A1C  12/25/2020   MAMMOGRAM  06/29/2022   COLONOSCOPY (Pts 45-4yr Insurance coverage will need to be confirmed)  07/09/2024   TETANUS/TDAP  05/06/2027   PNEUMOCOCCAL POLYSACCHARIDE VACCINE AGE 66-64 HIGH RISK  Completed   Hepatitis C Screening  Completed   HIV Screening  Completed   Zoster Vaccines- Shingrix  Completed   HPV VACCINES  Aged Out   Pneumococcal Vaccine [id number] old  Discontinued    The following portions of the patient's history were reviewed and updated as appropriate: allergies, current medications, past family history, past medical history, past social history, past surgical history, and problem list.  Review of Systems Pertinent items are noted in HPI.   Objective:    BP 131/69   Pulse 99   Ht 5'  3" (1.6 m)   Wt 237 lb (107.5 kg)   LMP 12/26/2011   SpO2 98%   BMI 41.98 kg/m  General appearance: alert, cooperative, and appears stated age Head: Normocephalic, without obvious abnormality, atraumatic Eyes:  conj clear, EOMI< PEERLA, glasses Ears: normal TM's and external ear canals both ears Nose: Nares normal. Septum midline. Mucosa normal. No drainage or sinus tenderness. Throat: lips, mucosa, and tongue normal; teeth and gums normal Neck: no adenopathy, no carotid bruit, no JVD, supple, symmetrical, trachea midline, and thyroid not enlarged, symmetric, no tenderness/mass/nodules Back: symmetric, no curvature. ROM normal. No CVA tenderness. Lungs: clear to auscultation bilaterally Breasts: normal appearance, no masses or tenderness Heart: regular rate and rhythm, S1, S2 normal, no murmur, click, rub or gallop Abdomen: soft, non-tender; bowel sounds normal; no masses,  no organomegaly Pelvic: cervix normal in appearance, external genitalia normal, no adnexal masses or tenderness, no cervical motion tenderness, rectovaginal septum normal, uterus normal size, shape, and consistency, and vagina normal without discharge Extremities: extremities normal, atraumatic, no cyanosis or edema Pulses: 2+ and symmetric Skin: Skin color, texture, turgor normal. No rashes or lesions Lymph nodes: Cervical, supraclavicular, and axillary nodes normal. Neurologic: Grossly normal    Assessment:    Healthy female exam.       Plan:     See After Visit Summary for Counseling Recommendations  complete  physical examination Keep up a regular exercise program and make sure you are eating a healthy diet Try to eat 4 servings of dairy a day, or if you are lactose intolerant take a calcium with vitamin D daily.  Your vaccines are up to date.

## 2020-08-10 LAB — CYTOLOGY - PAP
Comment: NEGATIVE
Diagnosis: NEGATIVE
High risk HPV: NEGATIVE

## 2020-08-10 NOTE — Progress Notes (Signed)
Call patient: Your Pap smear is normal. Repeat in 5 years.

## 2020-09-05 ENCOUNTER — Other Ambulatory Visit: Payer: Self-pay

## 2020-09-05 ENCOUNTER — Ambulatory Visit (INDEPENDENT_AMBULATORY_CARE_PROVIDER_SITE_OTHER): Payer: 59 | Admitting: Sports Medicine

## 2020-09-05 ENCOUNTER — Ambulatory Visit (INDEPENDENT_AMBULATORY_CARE_PROVIDER_SITE_OTHER): Payer: 59

## 2020-09-05 DIAGNOSIS — G5603 Carpal tunnel syndrome, bilateral upper limbs: Secondary | ICD-10-CM

## 2020-09-05 DIAGNOSIS — M722 Plantar fascial fibromatosis: Secondary | ICD-10-CM

## 2020-09-05 DIAGNOSIS — M76821 Posterior tibial tendinitis, right leg: Secondary | ICD-10-CM | POA: Diagnosis not present

## 2020-09-05 NOTE — Assessment & Plan Note (Signed)
Martha [name] has clinically diagnosed bilateral carpal tunnel syndrome, I did median nerve Hydro dissections about a year ago with ultrasound guidance, she is having a recurrence of paresthesias into the hand and forearms, she tells me she suspects it is not carpal tunnel syndrome, we can confirm this diagnosis with nerve conduction/EMG, referral placed.

## 2020-09-05 NOTE — Progress Notes (Signed)
    Procedures performed today:    Procedure: Real-time Ultrasound Guided injection of the left plantar fascia origin Device: Samsung HS60  Verbal informed consent obtained.  Time-out conducted.  Noted no overlying erythema, induration, or other signs of local infection.  Skin prepped in a sterile fashion.  Local anesthesia: Topical Ethyl chloride.  With sterile technique and under real time ultrasound guidance: Noted thickened plantar fascia, 1 cc Kenalog 40, 1 cc lidocaine, 1 cc bupivacaine injected easily Completed without difficulty  Advised to call if fevers/chills, erythema, induration, drainage, or persistent bleeding.  Images permanently stored and available for review in PACS.  Impression: Technically successful ultrasound guided injection.  Independent interpretation of notes and tests performed by another provider:   None.  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 She has had sets of orthotics in the past that have been fairly effective. She does need a new set of custom molded orthotics likely with heel lifts, so we will get [name] referred to Dr. Raeford Razor for this, I am also going to add some tibialis posterior conditioning exercises. Return to see me in a month for this.  Plantar fasciitis, left Increasing pain left heel, worse in the mornings, clinically tender at the plantar fascial origin. Injected today per patient request, custom orthotics, conditioning exercises, dorsal splinting also added, return in a month.  Carpal tunnel syndrome, bilateral Martha [name] has clinically diagnosed bilateral carpal tunnel syndrome, I did median nerve Hydro dissections about a year ago with ultrasound guidance, she is  having a recurrence of paresthesias into the hand and forearms, she tells me she suspects it is not carpal tunnel syndrome, we can confirm this diagnosis with nerve conduction/EMG, referral placed.    ___________________________________________ Martha [name]. Martha [name], M.D., ABFM., CAQSM. Primary Care and [place] Instructor of [facility] of Medicine

## 2020-09-05 NOTE — Assessment & Plan Note (Signed)
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 She has had sets of orthotics in the past that have been fairly effective. She does need a new set of custom molded orthotics likely with heel lifts, so we will get her referred to Dr. Raeford Razor for this, I am also going to add some tibialis posterior conditioning exercises. Return to see me in a month for this.

## 2020-09-05 NOTE — Assessment & Plan Note (Signed)
Increasing pain left heel, worse in the mornings, clinically tender at the plantar fascial origin. Injected today per patient request, custom orthotics, conditioning exercises, dorsal splinting also added, return in a month.

## 2020-09-12 ENCOUNTER — Ambulatory Visit (INDEPENDENT_AMBULATORY_CARE_PROVIDER_SITE_OTHER): Payer: 59 | Admitting: Family Medicine

## 2020-09-12 ENCOUNTER — Encounter: Payer: Self-pay | Admitting: Family Medicine

## 2020-09-12 ENCOUNTER — Other Ambulatory Visit: Payer: Self-pay

## 2020-09-12 DIAGNOSIS — M76821 Posterior tibial tendinitis, right leg: Secondary | ICD-10-CM

## 2020-09-12 NOTE — Assessment & Plan Note (Signed)
Has translation on ambulation of the hindfoot is likely leading to the posterior tibialis irritation. -Counseled on home exercise therapy and supportive care. -Orthotics today. -Place medial heel wedge with some correction.  May need to add a scaphoid pad as well.

## 2020-09-12 NOTE — Progress Notes (Signed)
Martha Woods - 60 y.o. female MRN [id number]  Date of birth: 1960-07-06  SUBJECTIVE:  Including CC & ROS.  No chief complaint on file.   Martha Woods is a 60 y.o. female that is presenting with right foot pain.  The pain is occurring over the posterior tibialis.  No injury.  Seems to be worse with ambulation.   Review of Systems See HPI   HISTORY: Past Medical, Surgical, Social, and Family History Reviewed & Updated per EMR.   Pertinent Historical Findings include:  Past Medical History:  Diagnosis Date   Abnormal Pap smear    cervical cryo   Cancer ([place]) 02-2009   papillary thyroid cancer, Dr. Malachy Mood   Cervical spondylosis    Diabetes mellitus    Hyperlipidemia    Hypertension    Lumbar disc herniation    Lumbar spondylosis    Seborrheic dermatitis     Past Surgical History:  Procedure Laterality Date   ett     neg for ischemia -7 mets pelvic u/s normal   plantar fascitis  01-2010   L (Dr. Wardell Honour)   REDUCTION MAMMAPLASTY Bilateral    sept 2019   REPLACEMENT TOTAL KNEE Right 2019   REPLACEMENT TOTAL KNEE Left 11/42019   Dr. Tacey Ruiz   TOTAL THYROIDECTOMY  02-2009   thyroid cancer    Family History  Problem Relation Age of Onset   Diabetes Mother    Diabetes Brother    Hypertension Brother    Cancer Maternal Grandmother        ovarian cancer    Social History   Socioeconomic History   Marital status: Married    Spouse name: Not on file   Number of children: Not on file   Years of education: Not on file   Highest education level: Not on file  Occupational History   Not on file  Tobacco Use   Smoking status: Never   Smokeless tobacco: Never  Substance and Sexual Activity   Alcohol use: Yes   Drug use: No   Sexual activity: Not on file  Other Topics Concern   Not on file  Social History Narrative   Not on file   Social Determinants of Health   Financial Resource Strain: Not on file  Food Insecurity: Not on file   Transportation Needs: Not on file  Physical Activity: Not on file  Stress: Not on file  Social Connections: Not on file  Intimate Partner Violence: Not on file     PHYSICAL EXAM:  VS: Ht 5' 3.5" (1.613 m)   Wt 231 lb (104.8 kg)   LMP 12/26/2011   BMI 40.28 kg/m  Physical Exam Gen: NAD, alert, cooperative with exam, well-appearing   Patient was fitted for a standard, cushioned, semi-rigid orthotic. The orthotic was heated and afterward the patient stood on the orthotic blank positioned on the orthotic stand. The patient was positioned in subtalar neutral position and 10 degrees of ankle dorsiflexion in a weight bearing stance. After completion of molding, a stable base was applied to the orthotic blank. The blank was ground to a stable position for weight bearing. Size: 61M Pairs: 2 Base: Blue EVA Additional Posting and Padding: medial heel wedge The patient ambulated these, and they were very comfortable.     ASSESSMENT & PLAN:   Posterior tibial tendinitis of right lower extremity Has translation on ambulation of the hindfoot is likely leading to the posterior tibialis irritation. -Counseled on home exercise therapy and supportive care. -Orthotics  today. -Place medial heel wedge with some correction.  May need to add a scaphoid pad as well.

## 2020-09-25 ENCOUNTER — Ambulatory Visit (INDEPENDENT_AMBULATORY_CARE_PROVIDER_SITE_OTHER): Payer: 59 | Admitting: Family Medicine

## 2020-09-25 ENCOUNTER — Encounter: Payer: Self-pay | Admitting: Family Medicine

## 2020-09-25 VITALS — BP 137/77 | HR 69 | Ht 64.0 in | Wt 234.0 lb

## 2020-09-25 DIAGNOSIS — Z23 Encounter for immunization: Secondary | ICD-10-CM | POA: Diagnosis not present

## 2020-09-25 DIAGNOSIS — E119 Type 2 diabetes mellitus without complications: Secondary | ICD-10-CM | POA: Diagnosis not present

## 2020-09-25 DIAGNOSIS — G5603 Carpal tunnel syndrome, bilateral upper limbs: Secondary | ICD-10-CM

## 2020-09-25 DIAGNOSIS — E890 Postprocedural hypothyroidism: Secondary | ICD-10-CM

## 2020-09-25 LAB — POCT GLYCOSYLATED HEMOGLOBIN (HGB A1C): Hemoglobin A1C: 7.2 % — AB (ref 4.0–5.6)

## 2020-09-25 MED ORDER — OZEMPIC (0.25 OR 0.5 MG/DOSE) 2 MG/1.5ML ~~LOC~~ SOPN: 0.2500 mg | PEN_INJECTOR | SUBCUTANEOUS | 2 refills | Status: DC

## 2020-09-25 NOTE — Progress Notes (Signed)
Established Patient Office Visit  Subjective:  Patient ID: Martha Woods, female    DOB: 1960-02-11  Age: 60 y.o. MRN: [id number]  CC:  Chief Complaint  Patient presents with   Diabetes   Hypertension    HPI Martha Woods presents for   Diabetes - no hypoglycemic events. No wounds or sores that are not healing well. No increased thirst or urination. Checking glucose at home. Taking medications as prescribed without any side effects.  She is also been having some problems recently with her left shoulder as well as carpal tunnel.  She has been working with sports medicine on this.  Because she is having pain radiating down and particularly into her left hand and arm they had recommended an nerve conduction study to better determine if it is coming from carpal tunnel or if it is coming from higher up.  She says the referral was placed for [facility] neurological and she would prefer not to go there unfortunately she said she is had an almost 2-hour wait every time she goes and said would like to go somewhere else locally.   Past Medical History:  Diagnosis Date   Abnormal Pap smear    cervical cryo   Cancer ([place]) 02-2009   papillary thyroid cancer, Dr. Malachy Mood   Cervical spondylosis    Diabetes mellitus    Hyperlipidemia    Hypertension    Lumbar disc herniation    Lumbar spondylosis    Seborrheic dermatitis     Past Surgical History:  Procedure Laterality Date   ett     neg for ischemia -7 mets pelvic u/s normal   plantar fascitis  01-2010   L (Dr. Wardell Honour)   REDUCTION MAMMAPLASTY Bilateral    sept 2019   REPLACEMENT TOTAL KNEE Right 2019   REPLACEMENT TOTAL KNEE Left 11/42019   Dr. Tacey Ruiz   TOTAL THYROIDECTOMY  02-2009   thyroid cancer    Family History  Problem Relation Age of Onset   Diabetes Mother    Diabetes Brother    Hypertension Brother    Cancer Maternal Grandmother        ovarian cancer    Social History   Socioeconomic  History   Marital status: Married    Spouse name: Not on file   Number of children: Not on file   Years of education: Not on file   Highest education level: Not on file  Occupational History   Not on file  Tobacco Use   Smoking status: Never   Smokeless tobacco: Never  Substance and Sexual Activity   Alcohol use: Yes   Drug use: No   Sexual activity: Not on file  Other Topics Concern   Not on file  Social History Narrative   Not on file   Social Determinants of Health   Financial Resource Strain: Not on file  Food Insecurity: Not on file  Transportation Needs: Not on file  Physical Activity: Not on file  Stress: Not on file  Social Connections: Not on file  Intimate Partner Violence: Not on file    Outpatient Medications Prior to Visit  Medication Sig Dispense Refill   aspirin 81 MG tablet Take 81 mg by mouth daily.     Cholecalciferol (VITAMIN D) 2000 UNITS tablet Take 2,000 Units by mouth.     levothyroxine (SYNTHROID) 200 MCG tablet Take 1 tablet (200 mcg total) by mouth daily before breakfast. 90 tablet 0   lisinopril (ZESTRIL) 40 MG tablet Take 1  tablet (40 mg total) by mouth daily. 90 tablet 3   lovastatin (MEVACOR) 20 MG tablet Take 1 tablet (20 mg total) by mouth at bedtime. 90 tablet 3   omeprazole (PRILOSEC) 20 MG capsule Take 1 capsule (20 mg total) by mouth daily. 90 capsule 1   sitaGLIPtin-metformin (JANUMET) 50-1000 MG tablet TAKE 1 TABLET BY MOUTH TWO  TIMES DAILY WITH MEALS 180 tablet 3   traMADol (ULTRAM) 50 MG tablet Take 1 tablet (50 mg total) by mouth every 6 (six) hours as needed. 90 tablet 0   Travoprost, BAK Free, (TRAVATAN) 0.004 % SOLN ophthalmic solution Place 1 drop into both eyes every evening.     No facility-administered medications prior to visit.    Allergies  Allergen Reactions   Cyclobenzaprine Other (See Comments)    Other reaction(s): Other "Made me sleep all the time." "Made me sleep all the time."    Glipizide Other (See  Comments)    Hypoglycemia   Phentermine Other (See Comments)    palpitations   Topamax [Topiramate] Other (See Comments)    Mood change, depression     ROS Review of Systems    Objective:    Physical Exam Constitutional:      Appearance: Normal appearance. She is well-developed.  HENT:     Head: Normocephalic and atraumatic.  Cardiovascular:     Rate and Rhythm: Normal rate and regular rhythm.     Heart sounds: Normal heart sounds.  Pulmonary:     Effort: Pulmonary effort is normal.     Breath sounds: Normal breath sounds.  Skin:    General: Skin is warm and dry.  Neurological:     Mental Status: She is alert and oriented to person, place, and time.  Psychiatric:        Behavior: Behavior normal.    BP 137/77   Pulse 69   Ht '5\' 4"'$  (1.626 m)   Wt 234 lb (106.1 kg)   LMP 12/26/2011   SpO2 100%   BMI 40.17 kg/m  Wt Readings from Last 3 Encounters:  09/25/20 234 lb (106.1 kg)  09/12/20 231 lb (104.8 kg)  08/08/20 237 lb (107.5 kg)     There are no preventive care reminders to display for this patient.  There are no preventive care reminders to display for this patient.  Lab Results  Component Value Date   TSH 16.67 (H) 06/25/2020   Lab Results  Component Value Date   WBC 6.0 06/25/2020   HGB 11.0 (L) 06/25/2020   HCT 36.1 06/25/2020   MCV 72.1 (L) 06/25/2020   PLT 319 06/25/2020   Lab Results  Component Value Date   NA 137 06/25/2020   K 4.8 06/25/2020   CO2 28 06/25/2020   GLUCOSE 156 (H) 06/25/2020   BUN 23 06/25/2020   CREATININE 0.94 06/25/2020   BILITOT 0.5 06/25/2020   ALKPHOS 75 04/29/2017   AST 16 06/25/2020   ALT 16 06/25/2020   PROT 7.0 06/25/2020   ALBUMIN 3.8 01/23/2015   CALCIUM 8.7 06/25/2020   Lab Results  Component Value Date   CHOL 150 06/25/2020   Lab Results  Component Value Date   HDL 58 06/25/2020   Lab Results  Component Value Date   LDLCALC 76 06/25/2020   Lab Results  Component Value Date   TRIG 77  06/25/2020   Lab Results  Component Value Date   CHOLHDL 2.6 06/25/2020   Lab Results  Component Value Date   HGBA1C 7.2 (  A) 09/25/2020      Assessment & Plan:   Problem List Items Addressed This Visit       Endocrine   Type 2 diabetes mellitus without complication, without long-term current use of insulin ([place]) - Primary    The plan will be to see if we can get the Ozempic covered.  If not then please let me know and we can try to see if they will cover and have Trulicity in stock.  Once you start the medication and you have been on it for a couple of weeks and you are doing well then our plan will be to discontinue the Januvia and switch to plain metformin.      Relevant Medications   Semaglutide,0.25 or 0.'5MG'$ /DOS, (OZEMPIC, 0.25 OR 0.5 MG/DOSE,) 2 MG/1.5ML SOPN   Other Relevant Orders   POCT glycosylated hemoglobin (Hb A1C) (Completed)   Post-surgical hypothyroidism     Nervous and Auditory   Carpal tunnel syndrome, bilateral   Relevant Orders   Ambulatory referral to Neurology   Other Visit Diagnoses     Need for immunization against influenza       Relevant Orders   Flu Vaccine QUAD 7moIM (Fluarix, Fluzone & Alfiuria Quad PF) (Completed)       Carpal tunnel-new referral placed for new neurology consultation for nerve conduction studies.  Meds ordered this encounter  Medications   Semaglutide,0.25 or 0.'5MG'$ /DOS, (OZEMPIC, 0.25 OR 0.5 MG/DOSE,) 2 MG/1.5ML SOPN    Sig: Inject 0.25 mg into the skin once a week. Then increase to 0.'5mg'$  weekly after that    Dispense:  3 mL    Refill:  2     Follow-up: Return in about 3 months (around 12/25/2020) for Diabetes follow-up.    CBeatrice Lecher MD

## 2020-09-25 NOTE — Assessment & Plan Note (Signed)
The plan will be to see if we can get the Ozempic covered.  If not then please let me know and we can try to see if they will cover and have Trulicity in stock.  Once you start the medication and you have been on it for a couple of weeks and you are doing well then our plan will be to discontinue the Januvia and switch to plain metformin.

## 2020-09-25 NOTE — Patient Instructions (Signed)
The plan will be to see if we can get the Ozempic covered.  If not then please let me know and we can try to see if they will cover and have Trulicity in stock.  Once you start the medication and you have been on it for a couple of weeks and you are doing well then our plan will be to discontinue the Januvia and switch to plain metformin.

## 2020-10-09 ENCOUNTER — Ambulatory Visit (INDEPENDENT_AMBULATORY_CARE_PROVIDER_SITE_OTHER): Payer: 59 | Admitting: Sports Medicine

## 2020-10-09 ENCOUNTER — Other Ambulatory Visit: Payer: Self-pay | Admitting: *Deleted

## 2020-10-09 DIAGNOSIS — G5603 Carpal tunnel syndrome, bilateral upper limbs: Secondary | ICD-10-CM

## 2020-10-09 DIAGNOSIS — M76821 Posterior tibial tendinitis, right leg: Secondary | ICD-10-CM | POA: Diagnosis not present

## 2020-10-09 DIAGNOSIS — E119 Type 2 diabetes mellitus without complications: Secondary | ICD-10-CM

## 2020-10-09 DIAGNOSIS — M722 Plantar fascial fibromatosis: Secondary | ICD-10-CM

## 2020-10-09 MED ORDER — OZEMPIC (0.25 OR 0.5 MG/DOSE) 2 MG/1.5ML ~~LOC~~ SOPN: 0.2500 mg | PEN_INJECTOR | SUBCUTANEOUS | 2 refills | Status: DC

## 2020-10-09 NOTE — Assessment & Plan Note (Signed)
Inject of the last visit, now has custom orthotics, has a bit of discomfort for 15 minutes in the morning but otherwise doing well. Return as needed for this.

## 2020-10-09 NOTE — Assessment & Plan Note (Signed)
Martha Woods has clinically diagnosed bilateral carpal tunnel syndrome with a median nerve Hydro dissection about a year ago, she was started to have recurrence of paresthesias into the hands and forearms, she told me she suspected it was not carpal tunnel and we agreed that we could use a nerve conduction/EMG as a tie breaker. We did a referral to [facility], it sounds like she prefers a different neurologist, her PCP did a referral to [place], I think the appointment coming up in November, we will standby.

## 2020-10-09 NOTE — Assessment & Plan Note (Signed)
Tibialis posterior tendinitis right-sided, doing conditioning exercises, got orthotics with Dr. Raeford Razor, doing extremely well, return as needed.

## 2020-10-09 NOTE — Progress Notes (Signed)
    Procedures performed today:    None.  Independent interpretation of notes and tests performed by another provider:   None.  Brief History, Exam, Impression, and Recommendations:    Plantar fasciitis, left Inject of the last visit, now has custom orthotics, has a bit of discomfort for 15 minutes in the morning but otherwise doing well. Return as needed for this.  Posterior tibial tendinitis of right lower extremity Tibialis posterior tendinitis right-sided, doing conditioning exercises, got orthotics with Dr. Raeford Razor, doing extremely well, return as needed.  Carpal tunnel syndrome, bilateral Jerelene has clinically diagnosed bilateral carpal tunnel syndrome with a median nerve Hydro dissection about a year ago, she was started to have recurrence of paresthesias into the hands and forearms, she told me she suspected it was not carpal tunnel and we agreed that we could use a nerve conduction/EMG as a tie breaker. We did a referral to [facility], it sounds like she prefers a different neurologist, her PCP did a referral to [place], I think the appointment coming up in November, we will standby.    ___________________________________________ Gwen Her. Dianah Field, M.D., ABFM., CAQSM. Primary Care and [place] Instructor of [place] of [facility] of Medicine

## 2020-10-24 LAB — HM DIABETES EYE EXAM

## 2020-11-19 ENCOUNTER — Other Ambulatory Visit: Payer: Self-pay

## 2020-11-19 ENCOUNTER — Other Ambulatory Visit: Payer: Self-pay | Admitting: *Deleted

## 2020-11-19 ENCOUNTER — Ambulatory Visit (INDEPENDENT_AMBULATORY_CARE_PROVIDER_SITE_OTHER): Payer: 59

## 2020-11-19 ENCOUNTER — Ambulatory Visit (INDEPENDENT_AMBULATORY_CARE_PROVIDER_SITE_OTHER): Payer: 59 | Admitting: Sports Medicine

## 2020-11-19 DIAGNOSIS — M722 Plantar fascial fibromatosis: Secondary | ICD-10-CM | POA: Diagnosis not present

## 2020-11-19 DIAGNOSIS — E890 Postprocedural hypothyroidism: Secondary | ICD-10-CM

## 2020-11-19 MED ORDER — LEVOTHYROXINE SODIUM 200 MCG PO TABS
200.0000 ug | ORAL_TABLET | Freq: Every day | ORAL | 0 refills | Status: DC
Start: 2020-11-19 — End: 2021-02-06

## 2020-11-19 NOTE — Assessment & Plan Note (Signed)
Martha Woods returns, she did really well with initial treatment for Plantar fasciitis, I injected it in August and she got some custom orthotics after that, more recently she took a misstep, and felt a tearing sensation on the bottom of her left heel, this occurred 2 days ago. On exam she has tenderness at the plantar fascia, its not palpable. Negative Thompson's test and good Achilles continuity. X-rays today. We will add a boot, she can take tramadol that she already has at home, out of work for 2 weeks, boot for 3-4. Return to see me in 3 to 4 weeks.

## 2020-11-19 NOTE — Progress Notes (Signed)
    Procedures performed today:    None.  Independent interpretation of notes and tests performed by another provider:   None.  Brief History, Exam, Impression, and Recommendations:    Plantar fasciitis, left Jamonica returns, she did really well with initial treatment for Plantar fasciitis, I injected it in August and she got some custom orthotics after that, more recently she took a misstep, and felt a tearing sensation on the bottom of her left heel, this occurred 2 days ago. On exam she has tenderness at the plantar fascia, its not palpable. Negative Thompson's test and good Achilles continuity. X-rays today. We will add a boot, she can take tramadol that she already has at home, out of work for 2 weeks, boot for 3-4. Return to see me in 3 to 4 weeks.    ___________________________________________ Gwen Her. Dianah Field, M.D., ABFM., CAQSM. Primary Care and [place] Instructor of [place] of [facility] of Medicine

## 2020-11-27 ENCOUNTER — Telehealth: Payer: Self-pay

## 2020-11-27 DIAGNOSIS — M180 Bilateral primary osteoarthritis of first carpometacarpal joints: Secondary | ICD-10-CM

## 2020-11-27 DIAGNOSIS — E119 Type 2 diabetes mellitus without complications: Secondary | ICD-10-CM

## 2020-11-27 MED ORDER — OZEMPIC (0.25 OR 0.5 MG/DOSE) 2 MG/1.5ML ~~LOC~~ SOPN: 0.5000 mg | PEN_INJECTOR | SUBCUTANEOUS | 1 refills | Status: DC

## 2020-11-27 NOTE — Telephone Encounter (Signed)
Martha Woods is wanting a refill of Ozempic sent to [facility]. Do you want her to stay at the same dose?

## 2020-11-27 NOTE — Telephone Encounter (Signed)
Willl bump up to 0.5mg .

## 2020-11-28 ENCOUNTER — Telehealth: Payer: Self-pay

## 2020-11-28 MED ORDER — OMEPRAZOLE 20 MG PO CPDR: 20.0000 mg | DELAYED_RELEASE_CAPSULE | Freq: Every day | ORAL | 1 refills | Status: DC

## 2020-11-28 MED ORDER — OZEMPIC (0.25 OR 0.5 MG/DOSE) 2 MG/1.5ML ~~LOC~~ SOPN: 0.5000 mg | PEN_INJECTOR | SUBCUTANEOUS | 0 refills | Status: DC

## 2020-11-28 NOTE — Telephone Encounter (Signed)
Letter written

## 2020-11-28 NOTE — Telephone Encounter (Signed)
Pt is requesting a return to work note for Monday, 12/03/20.  Advised pt that letter would be available in her MyChart to view and print.  Charyl Bigger, CMA

## 2020-11-28 NOTE — Addendum Note (Signed)
Addended by: Narda Rutherford on: 11/28/2020 10:00 AM   Modules accepted: Orders

## 2020-11-29 ENCOUNTER — Encounter (INDEPENDENT_AMBULATORY_CARE_PROVIDER_SITE_OTHER): Payer: 59 | Admitting: Sports Medicine

## 2020-11-29 ENCOUNTER — Encounter: Payer: Self-pay | Admitting: Sports Medicine

## 2020-11-29 DIAGNOSIS — G5603 Carpal tunnel syndrome, bilateral upper limbs: Secondary | ICD-10-CM | POA: Diagnosis not present

## 2020-11-29 NOTE — Telephone Encounter (Signed)
I spent 5 total minutes of online digital evaluation and management services in this patient-initiated request for online care. 

## 2020-12-04 ENCOUNTER — Other Ambulatory Visit: Payer: Self-pay

## 2020-12-04 ENCOUNTER — Ambulatory Visit (INDEPENDENT_AMBULATORY_CARE_PROVIDER_SITE_OTHER): Payer: 59 | Admitting: Sports Medicine

## 2020-12-04 DIAGNOSIS — M722 Plantar fascial fibromatosis: Secondary | ICD-10-CM

## 2020-12-04 NOTE — Assessment & Plan Note (Signed)
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 She did really well, is having good symptom control, we filled out FMLA paperwork today for her to go back to work, she can return to see me as needed.

## 2020-12-04 NOTE — Progress Notes (Signed)
    Procedures performed today:    None.  Independent interpretation of notes and tests performed by another provider:   None.  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 She did really well, is having good symptom control, we filled out FMLA paperwork today for her to go back to work, she can return to see me as needed.    ___________________________________________ Gwen Her. Dianah Field, M.D., ABFM., CAQSM. Primary Care and [place] Instructor of [place] of [facility] of Medicine

## 2020-12-18 ENCOUNTER — Ambulatory Visit: Payer: 59 | Admitting: Sports Medicine

## 2020-12-25 ENCOUNTER — Ambulatory Visit: Payer: 59 | Admitting: Family Medicine

## 2021-01-02 ENCOUNTER — Ambulatory Visit (INDEPENDENT_AMBULATORY_CARE_PROVIDER_SITE_OTHER): Payer: 59 | Admitting: Family Medicine

## 2021-01-02 ENCOUNTER — Other Ambulatory Visit: Payer: Self-pay

## 2021-01-02 ENCOUNTER — Encounter: Payer: Self-pay | Admitting: Family Medicine

## 2021-01-02 VITALS — BP 128/76 | HR 93 | Ht 64.0 in | Wt 222.0 lb

## 2021-01-02 DIAGNOSIS — I10 Essential (primary) hypertension: Secondary | ICD-10-CM

## 2021-01-02 DIAGNOSIS — E119 Type 2 diabetes mellitus without complications: Secondary | ICD-10-CM | POA: Diagnosis not present

## 2021-01-02 LAB — BASIC METABOLIC PANEL WITH GFR
BUN: 20 mg/dL (ref 7–25)
CO2: 28 mmol/L (ref 20–32)
Calcium: 8.8 mg/dL (ref 8.6–10.4)
Chloride: 101 mmol/L (ref 98–110)
Creat: 0.8 mg/dL (ref 0.50–1.05)
Glucose, Bld: 140 mg/dL — ABNORMAL HIGH (ref 65–99)
Potassium: 5 mmol/L (ref 3.5–5.3)
Sodium: 138 mmol/L (ref 135–146)
eGFR: 84 mL/min/{1.73_m2} (ref >=60)

## 2021-01-02 LAB — POCT GLYCOSYLATED HEMOGLOBIN (HGB A1C): Hemoglobin A1C: 6.8 % — AB (ref 4.0–5.6)

## 2021-01-02 MED ORDER — METFORMIN HCL ER 500 MG PO TB24: 1000.0000 mg | ORAL_TABLET | Freq: Every day | ORAL | 1 refills | Status: DC

## 2021-01-02 NOTE — Assessment & Plan Note (Signed)
A1c looks great today at 6.8 is finally under 7 which is fantastic.  So far doing well on the Ozempic but still getting nauseated for a couple of days after the shot.  So we will continue with the 0.5 mg for now concerned if we go up to 1 mg that may ramp up.  She has been able to portion control a little bit better.  And she is down about 12 pounds which is phenomenal.  She is really doing very well.  We will discontinue the Januvia component of her Janumet.  And just switch to plain metformin.  We discussed that if she continues to do well then we may be able to even get rid of the metformin and just take with the weekly shot.  She did let me know should be changing insurances in January so this may require new authorization.  And she will need to use [facility] in the future.

## 2021-01-02 NOTE — Progress Notes (Signed)
Established Patient Office Visit  Subjective:  Patient ID: Martha Woods, female    DOB: 1960-11-02  Age: 60 y.o. MRN: [id number]  CC:  Chief Complaint  Patient presents with   Diabetes    HPI Martha Woods presents for   Diabetes - no hypoglycemic events. No wounds or sores that are not healing well. No increased thirst or urination. Checking glucose at home. Taking medications as prescribed without any side effects. Eye exam performed in Dec.     Past Medical History:  Diagnosis Date   Abnormal Pap smear    cervical cryo   Cancer ([place]) 02-2009   papillary thyroid cancer, Dr. Malachy Mood   Cervical spondylosis    Diabetes mellitus    Hyperlipidemia    Hypertension    Lumbar disc herniation    Lumbar spondylosis    Seborrheic dermatitis     Past Surgical History:  Procedure Laterality Date   ett     neg for ischemia -7 mets pelvic u/s normal   plantar fascitis  01-2010   L (Dr. Wardell Honour)   REDUCTION MAMMAPLASTY Bilateral    sept 2019   REPLACEMENT TOTAL KNEE Right 2019   REPLACEMENT TOTAL KNEE Left 11/42019   Dr. Tacey Ruiz   TOTAL THYROIDECTOMY  02-2009   thyroid cancer    Family History  Problem Relation Age of Onset   Diabetes Mother    Diabetes Brother    Hypertension Brother    Cancer Maternal Grandmother        ovarian cancer    Social History   Socioeconomic History   Marital status: Married    Spouse name: Not on file   Number of children: Not on file   Years of education: Not on file   Highest education level: Not on file  Occupational History   Not on file  Tobacco Use   Smoking status: Never   Smokeless tobacco: Never  Substance and Sexual Activity   Alcohol use: Yes   Drug use: No   Sexual activity: Not on file  Other Topics Concern   Not on file  Social History Narrative   Not on file   Social Determinants of Health   Financial Resource Strain: Not on file  Food Insecurity: Not on file  Transportation  Needs: Not on file  Physical Activity: Not on file  Stress: Not on file  Social Connections: Not on file  Intimate Partner Violence: Not on file    Outpatient Medications Prior to Visit  Medication Sig Dispense Refill   aspirin 81 MG tablet Take 81 mg by mouth daily.     Cholecalciferol (VITAMIN D) 2000 UNITS tablet Take 2,000 Units by mouth.     levothyroxine (SYNTHROID) 200 MCG tablet Take 1 tablet (200 mcg total) by mouth daily before breakfast. 90 tablet 0   lisinopril (ZESTRIL) 40 MG tablet Take 1 tablet (40 mg total) by mouth daily. 90 tablet 3   lovastatin (MEVACOR) 20 MG tablet Take 1 tablet (20 mg total) by mouth at bedtime. 90 tablet 3   omeprazole (PRILOSEC) 20 MG capsule Take 1 capsule (20 mg total) by mouth daily. 90 capsule 1   Semaglutide,0.25 or 0.5MG /DOS, (OZEMPIC, 0.25 OR 0.5 MG/DOSE,) 2 MG/1.5ML SOPN Inject 0.5 mg into the skin once a week. Then increase to 0.5mg  weekly after that 4.5 mL 0   traMADol (ULTRAM) 50 MG tablet Take 1 tablet (50 mg total) by mouth every 6 (six) hours as needed. 90 tablet 0  Travoprost, BAK Free, (TRAVATAN) 0.004 % SOLN ophthalmic solution Place 1 drop into both eyes every evening.     sitaGLIPtin-metformin (JANUMET) 50-1000 MG tablet TAKE 1 TABLET BY MOUTH TWO  TIMES DAILY WITH MEALS 180 tablet 3   No facility-administered medications prior to visit.    Allergies  Allergen Reactions   Cyclobenzaprine Other (See Comments)    Other reaction(s): Other "Made me sleep all the time." "Made me sleep all the time."    Glipizide Other (See Comments)    Hypoglycemia   Phentermine Other (See Comments)    palpitations   Topamax [Topiramate] Other (See Comments)    Mood change, depression     ROS Review of Systems    Objective:    Physical Exam Constitutional:      Appearance: Normal appearance. She is well-developed.  HENT:     Head: Normocephalic and atraumatic.  Cardiovascular:     Rate and Rhythm: Normal rate and regular  rhythm.     Heart sounds: Normal heart sounds.  Pulmonary:     Effort: Pulmonary effort is normal.     Breath sounds: Normal breath sounds.  Skin:    General: Skin is warm and dry.  Neurological:     Mental Status: She is alert and oriented to person, place, and time.  Psychiatric:        Behavior: Behavior normal.    BP 128/76    Pulse 93    Ht 5\' 4"  (1.626 m)    Wt 222 lb (100.7 kg)    LMP 12/26/2011    SpO2 98%    BMI 38.11 kg/m  Wt Readings from Last 3 Encounters:  01/02/21 222 lb (100.7 kg)  09/25/20 234 lb (106.1 kg)  09/12/20 231 lb (104.8 kg)     Health Maintenance Due  Topic Date Due   OPHTHALMOLOGY EXAM  06/02/2018    There are no preventive care reminders to display for this patient.  Lab Results  Component Value Date   TSH 16.67 (H) 06/25/2020   Lab Results  Component Value Date   WBC 6.0 06/25/2020   HGB 11.0 (L) 06/25/2020   HCT 36.1 06/25/2020   MCV 72.1 (L) 06/25/2020   PLT 319 06/25/2020   Lab Results  Component Value Date   NA 137 06/25/2020   K 4.8 06/25/2020   CO2 28 06/25/2020   GLUCOSE 156 (H) 06/25/2020   BUN 23 06/25/2020   CREATININE 0.94 06/25/2020   BILITOT 0.5 06/25/2020   ALKPHOS 75 04/29/2017   AST 16 06/25/2020   ALT 16 06/25/2020   PROT 7.0 06/25/2020   ALBUMIN 3.8 01/23/2015   CALCIUM 8.7 06/25/2020   Lab Results  Component Value Date   CHOL 150 06/25/2020   Lab Results  Component Value Date   HDL 58 06/25/2020   Lab Results  Component Value Date   LDLCALC 76 06/25/2020   Lab Results  Component Value Date   TRIG 77 06/25/2020   Lab Results  Component Value Date   CHOLHDL 2.6 06/25/2020   Lab Results  Component Value Date   HGBA1C 6.8 (A) 01/02/2021      Assessment & Plan:   Problem List Items Addressed This Visit       Cardiovascular and Mediastinum   HYPERTENSION, BENIGN SYSTEMIC    Well controlled. Continue current regimen. Follow up in  3-4 mo         Endocrine   Type 2 diabetes mellitus  without complication, without long-term  current use of insulin (HCC) - Primary    A1c looks great today at 6.8 is finally under 7 which is fantastic.  So far doing well on the Ozempic but still getting nauseated for a couple of days after the shot.  So we will continue with the 0.5 mg for now concerned if we go up to 1 mg that may ramp up.  She has been able to portion control a little bit better.  And she is down about 12 pounds which is phenomenal.  She is really doing very well.  We will discontinue the Januvia component of her Janumet.  And just switch to plain metformin.  We discussed that if she continues to do well then we may be able to even get rid of the metformin and just take with the weekly shot.  She did let me know should be changing insurances in January so this may require new authorization.  And she will need to use [facility] in the future.      Relevant Medications   metFORMIN (GLUCOPHAGE-XR) 500 MG 24 hr tablet   Other Relevant Orders   POCT glycosylated hemoglobin (Hb A1C) (Completed)   BASIC METABOLIC PANEL WITH GFR    Will call to get an eye exam report.  Meds ordered this encounter  Medications   metFORMIN (GLUCOPHAGE-XR) 500 MG 24 hr tablet    Sig: Take 2 tablets (1,000 mg total) by mouth daily with breakfast.    Dispense:  180 tablet    Refill:  1    Follow-up: Return in about 4 months (around 05/03/2021) for Diabetes follow-up, Hypertension.    Beatrice Lecher, MD

## 2021-01-02 NOTE — Assessment & Plan Note (Signed)
Well controlled. Continue current regimen. Follow up in  3-4 mo  

## 2021-01-03 NOTE — Progress Notes (Signed)
Your lab work is within acceptable range and there are no concerning findings.   ?

## 2021-02-06 ENCOUNTER — Encounter: Payer: Self-pay | Admitting: Family Medicine

## 2021-02-06 DIAGNOSIS — E119 Type 2 diabetes mellitus without complications: Secondary | ICD-10-CM

## 2021-02-06 DIAGNOSIS — E890 Postprocedural hypothyroidism: Secondary | ICD-10-CM

## 2021-02-06 MED ORDER — LOVASTATIN 20 MG PO TABS: 20.0000 mg | ORAL_TABLET | Freq: Every day | ORAL | 3 refills | Status: DC

## 2021-02-06 MED ORDER — LISINOPRIL 40 MG PO TABS: 40.0000 mg | ORAL_TABLET | Freq: Every day | ORAL | 3 refills | Status: DC

## 2021-02-06 MED ORDER — OZEMPIC (0.25 OR 0.5 MG/DOSE) 2 MG/1.5ML ~~LOC~~ SOPN: 0.5000 mg | PEN_INJECTOR | SUBCUTANEOUS | 0 refills | Status: DC

## 2021-02-06 MED ORDER — LEVOTHYROXINE SODIUM 200 MCG PO TABS: 200.0000 ug | ORAL_TABLET | Freq: Every day | ORAL | 0 refills | Status: DC

## 2021-03-06 ENCOUNTER — Other Ambulatory Visit: Payer: Self-pay

## 2021-03-06 ENCOUNTER — Emergency Department
Admission: EM | Admit: 2021-03-06 | Discharge: 2021-03-06 | Disposition: A | Discharge status: Home / Self Care | Payer: BC Managed Care – PPO | Source: Home / Self Care | Attending: Family Medicine | Admitting: Family Medicine

## 2021-03-06 DIAGNOSIS — J209 Acute bronchitis, unspecified: Secondary | ICD-10-CM | POA: Diagnosis not present

## 2021-03-06 MED ORDER — AMOXICILLIN-POT CLAVULANATE 875-125 MG PO TABS: 1.0000 | ORAL_TABLET | Freq: Two times a day (BID) | ORAL | 0 refills | Status: DC

## 2021-03-06 NOTE — Discharge Instructions (Signed)
Make sure you are drinking lots of fluids Run a humidifier in your bedroom if you have 1 Consider Flonase twice a day for 3 days then once a day until your symptoms are gone Take the Augmentin 2 times a day for a week See your doctor if not improving by next week

## 2021-03-06 NOTE — ED Triage Notes (Signed)
Pt states that she has a cough, nasal congestion, chest congestion and wheezing. X3 weeks  Pt states that she has had covid vaccine. Pt states that she has had flu vaccine.  Pt states that she had negative covid test 2/21.

## 2021-03-06 NOTE — ED Provider Notes (Signed)
[facility]    CSN: [id number] Arrival date & time: 03/06/21  1053      History   Chief Complaint Chief Complaint  Patient presents with   Cough    X3 weeks Cough, nasal congestion, chest congestion and wheezing.    HPI Martha Woods is a 61 y.o. female.   HPI  Patient's had a cough, nasal congestion, sinus pressure, headache, chest congestion for about 3 weeks.  Last couple of days she has had wheezing.  She has done COVID testing on 2 occasions and it was negative.  She is coughing up yellow and brown sputum.  She is tired.  She has upper chest and throat pain from all the coughing.  No known exposure to illness.  Using over-the-counter medications.  Past Medical History:  Diagnosis Date   Abnormal Pap smear    cervical cryo   Cancer ([place]) 02-2009   papillary thyroid cancer, Dr. Malachy Mood   Cervical spondylosis    Diabetes mellitus    Hyperlipidemia    Hypertension    Lumbar disc herniation    Lumbar spondylosis    Seborrheic dermatitis     Patient Active Problem List   Diagnosis Date Noted   Plantar fasciitis, left 09/05/2020   Nocturnal cramping 11/24/2019   Primary osteoarthritis of both first carpometacarpal joints 10/27/2019   Low iron 11/23/2018   Lumbar back pain with radiculopathy affecting right lower extremity 07/21/2018   Unspecified inflammatory spondylopathy, lumbosacral region ([place]) 04/19/2018   S/P total knee replacement, right 08/04/2017   Type 2 diabetes mellitus without complication, without long-term current use of insulin ([place]) 09/12/2016   Facet arthritis of lumbosacral region 01/23/2016   Lumbago with sciatica, left side 12/10/2015   Glaucoma 10/24/2015   Leg length inequality 01/31/2015   Posterior tibial tendinitis of right lower extremity 01/23/2015   Thalassemia 11/10/2013   Migraine headache 08/10/2013   ASC-cannot exclude HGSIL on Pap 12/24/2011   Post-surgical hypothyroidism 02/20/2011   CARCINOMA,  THYROID GLAND, PAPILLARY 12/15/2008    Class: History of   Impingement syndrome of both shoulders 12/15/2007   Carpal tunnel syndrome, bilateral 08/10/2007   ARTHRITIS, CERVICAL SPINE 03/30/2006   Hyperlipidemia 10/21/2005   OBESITY, NOS 10/21/2005   HYPERTENSION, BENIGN SYSTEMIC 10/21/2005    Past Surgical History:  Procedure Laterality Date   ett     neg for ischemia -7 mets pelvic u/s normal   plantar fascitis  01-2010   L (Dr. Wardell Honour)   REDUCTION MAMMAPLASTY Bilateral    sept 2019   REPLACEMENT TOTAL KNEE Right 2019   REPLACEMENT TOTAL KNEE Left 11/42019   Dr. Tacey Ruiz   TOTAL THYROIDECTOMY  02-2009   thyroid cancer    OB History     Gravida  3   Para  2   Term  2   Preterm      AB  1   Living  2      SAB  1   IAB      Ectopic      Multiple      Live Births               Home Medications    Prior to Admission medications   Medication Sig Start Date End Date Taking? Authorizing Provider  amoxicillin-clavulanate (AUGMENTIN) 875-125 MG tablet Take 1 tablet by mouth every 12 (twelve) hours. 03/06/21  Yes Raylene Everts, MD  aspirin 81 MG tablet Take 81 mg by mouth daily.  Yes [provider]  Cholecalciferol (VITAMIN D) 2000 UNITS tablet Take 2,000 Units by mouth.   Yes [provider]  levothyroxine (SYNTHROID) 200 MCG tablet Take 1 tablet (200 mcg total) by mouth daily before breakfast. 02/06/21  Yes Hali Marry, MD  lisinopril (ZESTRIL) 40 MG tablet Take 1 tablet (40 mg total) by mouth daily. 02/06/21  Yes Hali Marry, MD  lovastatin (MEVACOR) 20 MG tablet Take 1 tablet (20 mg total) by mouth at bedtime. 02/06/21  Yes Hali Marry, MD  metFORMIN (GLUCOPHAGE-XR) 500 MG 24 hr tablet Take 2 tablets (1,000 mg total) by mouth daily with breakfast. 01/02/21  Yes Hali Marry, MD  omeprazole (PRILOSEC) 20 MG capsule Take 1 capsule (20 mg total) by mouth daily. 11/28/20  Yes Hali Marry, MD  Semaglutide,0.25 or 0.5MG /DOS, (OZEMPIC, 0.25 OR 0.5 MG/DOSE,) 2 MG/1.5ML SOPN Inject 0.5 mg into the skin once a week. Then increase to 0.5mg  weekly after that 02/06/21  Yes Metheney, Rene Kocher, MD  traMADol (ULTRAM) 50 MG tablet Take 1 tablet (50 mg total) by mouth every 6 (six) hours as needed. 07/18/10  Yes Bowen, Collene Leyden, DO  Travoprost, BAK Free, (TRAVATAN) 0.004 % SOLN ophthalmic solution Place 1 drop into both eyes every evening.   Yes [provider]    Family History Family History  Problem Relation Age of Onset   Diabetes Mother    Diabetes Brother    Hypertension Brother    Cancer Maternal Grandmother        ovarian cancer    Social History Social History   Tobacco Use   Smoking status: Never   Smokeless tobacco: Never  Substance Use Topics   Alcohol use: Yes    Comment: occ   Drug use: No     Allergies   Cyclobenzaprine, Glipizide, Phentermine, and Topamax [topiramate]   Review of Systems Review of Systems  See HPI Physical Exam Triage Vital Signs ED Triage Vitals  Enc Vitals Group     BP 03/06/21 1128 123/82     Pulse Rate 03/06/21 1128 98     Resp 03/06/21 1128 18     Temp 03/06/21 1128 98.5 F (36.9 C)     Temp Source 03/06/21 1128 Oral     SpO2 03/06/21 1128 99 %     Weight 03/06/21 1126 211 lb (95.7 kg)     Height 03/06/21 1126 5\' 4"  (1.626 m)     Head Circumference --      Peak Flow --      Pain Score 03/06/21 1126 0     Pain Loc --      Pain Edu? --      Excl. in [place]? --    No data found.  Updated Vital Signs BP 123/82 (BP Location: Left Arm)    Pulse 98    Temp 98.5 F (36.9 C) (Oral)    Resp 18    Ht 5\' 4"  (1.626 m)    Wt 95.7 kg    LMP 12/26/2011    SpO2 99%    BMI 36.22 kg/m      Physical Exam Constitutional:      General: She is not in acute distress.    Appearance: Normal appearance. She is well-developed.     Comments: Appears tired  HENT:     Head: Normocephalic and atraumatic.     Right Ear: Tympanic  membrane and ear canal normal.     Left Ear: Tympanic  membrane and ear canal normal.     Nose: Congestion and rhinorrhea present.     Mouth/Throat:     Mouth: Mucous membranes are moist.     Pharynx: No posterior oropharyngeal erythema.  Eyes:     Conjunctiva/sclera: Conjunctivae normal.     Pupils: Pupils are equal, round, and reactive to light.  Cardiovascular:     Rate and Rhythm: Normal rate and regular rhythm.     Heart sounds: Normal heart sounds.  Pulmonary:     Effort: Pulmonary effort is normal. No respiratory distress.     Breath sounds: Normal breath sounds. No wheezing or rhonchi.  Abdominal:     General: There is no distension.     Palpations: Abdomen is soft.  Musculoskeletal:        General: Normal range of motion.     Cervical back: Normal range of motion.  Lymphadenopathy:     Cervical: Cervical adenopathy present.  Skin:    General: Skin is warm and dry.  Neurological:     Mental Status: She is alert.  Psychiatric:        Mood and Affect: Mood normal.        Behavior: Behavior normal.     UC Treatments / Results  Labs (all labs ordered are listed, but only abnormal results are displayed) Labs Reviewed - No data to display  EKG   Radiology No results found.  Procedures Procedures (including critical care time)  Medications Ordered in UC Medications - No data to display  Initial Impression / Assessment and Plan / UC Course  I have reviewed the triage vital signs and the nursing notes.  Pertinent labs & imaging results that were available during my care of the patient were reviewed by me and considered in my medical decision making (see chart for details).     Final Clinical Impressions(s) / UC Diagnoses   Final diagnoses:  Acute bronchitis, unspecified organism     Discharge Instructions      Make sure you are drinking lots of fluids Run a humidifier in your bedroom if you have 1 Consider Flonase twice a day for 3 days then once a  day until your symptoms are gone Take the Augmentin 2 times a day for a week See your doctor if not improving by next week   ED Prescriptions     Medication Sig Dispense Auth. Provider   amoxicillin-clavulanate (AUGMENTIN) 875-125 MG tablet Take 1 tablet by mouth every 12 (twelve) hours. 14 tablet Raylene Everts, MD      PDMP not reviewed this encounter.   Raylene Everts, MD 03/06/21 [phone]

## 2021-04-03 ENCOUNTER — Other Ambulatory Visit: Payer: Self-pay | Admitting: Family Medicine

## 2021-04-03 DIAGNOSIS — E890 Postprocedural hypothyroidism: Secondary | ICD-10-CM

## 2021-04-16 ENCOUNTER — Other Ambulatory Visit: Payer: Self-pay | Admitting: Family Medicine

## 2021-04-16 DIAGNOSIS — E119 Type 2 diabetes mellitus without complications: Secondary | ICD-10-CM

## 2021-05-14 ENCOUNTER — Ambulatory Visit (INDEPENDENT_AMBULATORY_CARE_PROVIDER_SITE_OTHER): Payer: BC Managed Care – PPO | Admitting: Family Medicine

## 2021-05-14 ENCOUNTER — Encounter: Payer: Self-pay | Admitting: Family Medicine

## 2021-05-14 VITALS — BP 134/66 | HR 91 | Resp 18 | Ht 64.0 in | Wt 212.0 lb

## 2021-05-14 DIAGNOSIS — E7800 Pure hypercholesterolemia, unspecified: Secondary | ICD-10-CM

## 2021-05-14 DIAGNOSIS — I10 Essential (primary) hypertension: Secondary | ICD-10-CM

## 2021-05-14 DIAGNOSIS — M180 Bilateral primary osteoarthritis of first carpometacarpal joints: Secondary | ICD-10-CM

## 2021-05-14 DIAGNOSIS — E119 Type 2 diabetes mellitus without complications: Secondary | ICD-10-CM

## 2021-05-14 LAB — POCT GLYCOSYLATED HEMOGLOBIN (HGB A1C): Hemoglobin A1C: 7 % — AB (ref 4.0–5.6)

## 2021-05-14 LAB — POCT UA - MICROALBUMIN
Albumin/Creatinine Ratio, Urine, POC: <30
Creatinine, POC: 300 mg/dL
Microalbumin Ur, POC: 30 mg/L

## 2021-05-14 MED ORDER — OMEPRAZOLE 20 MG PO CPDR: 20.0000 mg | DELAYED_RELEASE_CAPSULE | Freq: Every day | ORAL | 3 refills | Status: AC

## 2021-05-14 MED ORDER — TRAMADOL HCL 50 MG PO TABS: 50.0000 mg | ORAL_TABLET | Freq: Four times a day (QID) | ORAL | 0 refills | Status: DC | PRN

## 2021-05-14 MED ORDER — LOVASTATIN 20 MG PO TABS: 20.0000 mg | ORAL_TABLET | Freq: Every day | ORAL | 3 refills | Status: DC

## 2021-05-14 MED ORDER — METFORMIN HCL ER 500 MG PO TB24: 1000.0000 mg | ORAL_TABLET | Freq: Every day | ORAL | 2 refills | Status: DC

## 2021-05-14 MED ORDER — LISINOPRIL 40 MG PO TABS: 40.0000 mg | ORAL_TABLET | Freq: Every day | ORAL | 3 refills | Status: DC

## 2021-05-14 MED ORDER — OZEMPIC (0.25 OR 0.5 MG/DOSE) 2 MG/1.5ML ~~LOC~~ SOPN: PEN_INJECTOR | SUBCUTANEOUS | 3 refills | Status: DC

## 2021-05-14 NOTE — Assessment & Plan Note (Signed)
Well controlled. Continue current regimen. Follow up in  3-4 mo  

## 2021-05-14 NOTE — Progress Notes (Signed)
? ?Established Patient Office Visit ? ?Subjective   ?Patient ID: Martha Woods, female    DOB: [birth year]  Age: 61 y.o. MRN: [id number] ? ?Chief Complaint  ?Patient presents with  ? Diabetes  ? Hypertension  ? ? ?HPI ? ?Diabetes - no hypoglycemic events. No wounds or sores that are not healing well. No increased thirst or urination. Checking glucose at home. Taking medications as prescribed without any side effects. ? ?Hypertension- Pt denies chest pain, SOB, dizziness, or heart palpitations.  Taking meds as directed w/o problems.  Denies medication side effects.   ? ?She did have a recent respiratory infection recently and went to UC. She was given Augment. She is feeling much better.   ? ?She also recently had bilateral injections in her shoulders.  She is doing a little better. ? ? ? ?ROS ? ?  ?Objective:  ?  ? ?BP 134/66   Pulse 91   Resp 18   Ht '5\' 4"'$  (1.626 m)   Wt 212 lb (96.2 kg)   LMP 12/26/2011   SpO2 99%   BMI 36.39 kg/m?  ? ? ?Physical Exam ?Vitals and nursing note reviewed.  ?Constitutional:   ?   Appearance: She is well-developed.  ?HENT:  ?   Head: Normocephalic and atraumatic.  ?Cardiovascular:  ?   Rate and Rhythm: Normal rate and regular rhythm.  ?   Heart sounds: Normal heart sounds.  ?Pulmonary:  ?   Effort: Pulmonary effort is normal.  ?   Breath sounds: Normal breath sounds.  ?Skin: ?   General: Skin is warm and dry.  ?Neurological:  ?   Mental Status: She is alert and oriented to person, place, and time.  ?Psychiatric:     ?   Behavior: Behavior normal.  ? ? ? ?Results for orders placed or performed in visit on 05/14/21  ?POCT glycosylated hemoglobin (Hb A1C)  ?Result Value Ref Range  ? Hemoglobin A1C 7.0 (A) 4.0 - 5.6 %  ? HbA1c POC (<> result, manual entry)    ? HbA1c, POC (prediabetic range)    ? HbA1c, POC (controlled diabetic range)    ?POCT UA - Microalbumin  ?Result Value Ref Range  ? Microalbumin Ur, POC 30 mg/L  ? Creatinine, POC 300 mg/dL  ? Albumin/Creatinine Ratio,  Urine, POC <30   ? ? ? ? ?The 10-year ASCVD risk score (Arnett DK, et al., 2019) is: 9% ? ?  ?Assessment & Plan:  ? ?Problem List Items Addressed This Visit   ? ?  ? Cardiovascular and Mediastinum  ? HYPERTENSION, BENIGN SYSTEMIC  ?  Well controlled. Continue current regimen. Follow up in  3-4 mo  ? ?  ?  ? Relevant Medications  ? lisinopril (ZESTRIL) 40 MG tablet  ? lovastatin (MEVACOR) 20 MG tablet  ?  ? Endocrine  ? Type 2 diabetes mellitus without complication, without long-term current use of insulin ([place]) - Primary  ?  A1c is up a little bit today at 7.0 compared to prior.  We discussed options including increasing her Ozempic.  She did recently travel back home to [place] to see family and attend her niece's wedding.  She says she wants to hold off on increasing the medication for now but would consider it next time she follows up.  Just encouraged her to continue to work on healthy food choices and regular activities.  She is no longer working full-time and is now as needed so hopefully this will give her a little  bit more time to work on her diet and exercise.  Follow-up in 3 months. ? ?  ?  ? Relevant Medications  ? lisinopril (ZESTRIL) 40 MG tablet  ? lovastatin (MEVACOR) 20 MG tablet  ? metFORMIN (GLUCOPHAGE-XR) 500 MG 24 hr tablet  ? Semaglutide,0.25 or 0.'5MG'$ /DOS, (OZEMPIC, 0.25 OR 0.5 MG/DOSE,) 2 MG/1.5ML SOPN  ? Other Relevant Orders  ? POCT glycosylated hemoglobin (Hb A1C) (Completed)  ? POCT UA - Microalbumin (Completed)  ?  ? Musculoskeletal and Integument  ? Primary osteoarthritis of both first carpometacarpal joints  ? Relevant Medications  ? omeprazole (PRILOSEC) 20 MG capsule  ? traMADol (ULTRAM) 50 MG tablet  ?  ? Other  ? Hyperlipidemia  ? Relevant Medications  ? lisinopril (ZESTRIL) 40 MG tablet  ? lovastatin (MEVACOR) 20 MG tablet  ? ? ?Return in about 3 months (around 08/14/2021) for Diabetes follow-up and blood work .  ? ? ?Beatrice Lecher, MD ? ?

## 2021-05-14 NOTE — Assessment & Plan Note (Signed)
A1c is up a little bit today at 7.0 compared to prior.  We discussed options including increasing her Ozempic.  She did recently travel back home to [place] to see family and attend her niece's wedding.  She says she wants to hold off on increasing the medication for now but would consider it next time she follows up.  Just encouraged her to continue to work on healthy food choices and regular activities.  She is no longer working full-time and is now as needed so hopefully this will give her a little bit more time to work on her diet and exercise.  Follow-up in 3 months. ?

## 2021-07-22 ENCOUNTER — Encounter: Payer: Self-pay | Admitting: Family Medicine

## 2021-07-22 ENCOUNTER — Other Ambulatory Visit: Payer: Self-pay

## 2021-07-22 DIAGNOSIS — M180 Bilateral primary osteoarthritis of first carpometacarpal joints: Secondary | ICD-10-CM

## 2021-07-22 MED ORDER — TRAMADOL HCL 50 MG PO TABS: 50.0000 mg | ORAL_TABLET | Freq: Four times a day (QID) | ORAL | 0 refills | Status: DC | PRN

## 2021-07-23 ENCOUNTER — Other Ambulatory Visit: Payer: Self-pay

## 2021-07-23 DIAGNOSIS — E119 Type 2 diabetes mellitus without complications: Secondary | ICD-10-CM

## 2021-07-23 DIAGNOSIS — E890 Postprocedural hypothyroidism: Secondary | ICD-10-CM

## 2021-07-23 DIAGNOSIS — I10 Essential (primary) hypertension: Secondary | ICD-10-CM

## 2021-07-23 DIAGNOSIS — E7800 Pure hypercholesterolemia, unspecified: Secondary | ICD-10-CM

## 2021-07-23 MED ORDER — OZEMPIC (0.25 OR 0.5 MG/DOSE) 2 MG/1.5ML ~~LOC~~ SOPN: PEN_INJECTOR | SUBCUTANEOUS | 3 refills | Status: DC

## 2021-07-23 MED ORDER — LEVOTHYROXINE SODIUM 200 MCG PO TABS: 200.0000 ug | ORAL_TABLET | Freq: Every day | ORAL | 0 refills | Status: DC

## 2021-07-23 MED ORDER — METFORMIN HCL ER 500 MG PO TB24: 1000.0000 mg | ORAL_TABLET | Freq: Every day | ORAL | 2 refills | Status: DC

## 2021-07-23 MED ORDER — LOVASTATIN 20 MG PO TABS: 20.0000 mg | ORAL_TABLET | Freq: Every day | ORAL | 2 refills | Status: DC

## 2021-07-23 MED ORDER — LISINOPRIL 40 MG PO TABS: 40.0000 mg | ORAL_TABLET | Freq: Every day | ORAL | 2 refills | Status: DC

## 2021-08-19 ENCOUNTER — Telehealth: Payer: Self-pay | Admitting: Family Medicine

## 2021-08-19 ENCOUNTER — Ambulatory Visit: Payer: BC Managed Care – PPO | Admitting: Family Medicine

## 2021-08-19 ENCOUNTER — Encounter: Payer: Self-pay | Admitting: Family Medicine

## 2021-08-19 VITALS — BP 119/76 | HR 70 | Ht 64.0 in | Wt 211.0 lb

## 2021-08-19 DIAGNOSIS — E119 Type 2 diabetes mellitus without complications: Secondary | ICD-10-CM | POA: Diagnosis not present

## 2021-08-19 DIAGNOSIS — I10 Essential (primary) hypertension: Secondary | ICD-10-CM | POA: Diagnosis not present

## 2021-08-19 DIAGNOSIS — E890 Postprocedural hypothyroidism: Secondary | ICD-10-CM

## 2021-08-19 DIAGNOSIS — M79601 Pain in right arm: Secondary | ICD-10-CM | POA: Diagnosis not present

## 2021-08-19 LAB — POCT GLYCOSYLATED HEMOGLOBIN (HGB A1C): HbA1c POC (<> result, manual entry): 6.3 % (ref 4.0–5.6)

## 2021-08-19 NOTE — Assessment & Plan Note (Signed)
Plan to recheck TSH today.

## 2021-08-19 NOTE — Telephone Encounter (Signed)
Please initial PA for ozempic. It is for diabetes.

## 2021-08-19 NOTE — Progress Notes (Addendum)
Established Patient Office Visit  Subjective   Patient ID: Martha Woods, female    DOB: [birth year]  Age: 61 y.o. MRN: [id number]  Chief Complaint  Patient presents with   Follow-up    Pt c/o  3 month follow up dm last  a1c was 7.0 on 05/14/21,    HPI   Diabetes - no hypoglycemic events. No wounds or sores that are not healing well. No increased thirst or urination. Checking glucose at home.  Running between 110 130.  Taking medications as prescribed without any side effects. Due for foot exam.  Eye exam done in December.    Hypothyroidism - Taking medication regularly in the AM away from food and vitamins, etc. No recent change to skin, hair, or energy levels.  Planning on going to [place] soon for her nieces birthday and to visit family.  She also noticed that she has had a vibration sensation running from just above the right shoulder down to the right above the antecubital fossa.  She noticed it when she was traveling.  She notices it more if she puts her elbow on the armrest if she just starts to move her shoulder and arm around it seems to go away she is not having any actual pain.  No injury.  She did fall while stepping onto a glass bottle boat while out of the country.  She did fall and hit her left arm and had some bruising but otherwise is okay.  ROS    Objective:     BP 119/76   Pulse 70   Ht '5\' 4"'$  (1.626 m)   Wt 211 lb (95.7 kg)   LMP 12/26/2011   SpO2 99%   BMI 36.22 kg/m    Physical Exam Vitals and nursing note reviewed.  Constitutional:      Appearance: She is well-developed.  HENT:     Head: Normocephalic and atraumatic.  Cardiovascular:     Rate and Rhythm: Normal rate and regular rhythm.     Heart sounds: Normal heart sounds.  Pulmonary:     Effort: Pulmonary effort is normal.     Breath sounds: Normal breath sounds.  Skin:    General: Skin is warm and dry.  Neurological:     Mental Status: She is alert and oriented to person, place,  and time.  Psychiatric:        Behavior: Behavior normal.      Results for orders placed or performed in visit on 08/19/21  POCT glycosylated hemoglobin (Hb A1C)  Result Value Ref Range   Hemoglobin A1C     HbA1c POC (<> result, manual entry) 6.3 4.0 - 5.6 %   HbA1c, POC (prediabetic range)     HbA1c, POC (controlled diabetic range)        The 10-year ASCVD risk score (Arnett DK, et al., 2019) is: 7.2%    Assessment & Plan:   Problem List Items Addressed This Visit       Cardiovascular and Mediastinum   HYPERTENSION, BENIGN SYSTEMIC    Blood pressure looks fantastic today.        Endocrine   Type 2 diabetes mellitus without complication, without long-term current use of insulin (HCC) - Primary    A1c looks great but she has had some difficulty getting Ozempic.  We discussed possibly going up to 1 mg at next refill.  She called the insurance company and they said that they needed additional information from [place].  Lab Results  Component Value  Date   HGBA1C 6.3 08/19/2021         Relevant Orders   POCT glycosylated hemoglobin (Hb A1C) (Completed)   TSH   Lipid Panel w/reflex Direct LDL   COMPLETE METABOLIC PANEL WITH GFR   CBC   Post-surgical hypothyroidism    Plan to recheck TSH today.      Relevant Orders   TSH   Lipid Panel w/reflex Direct LDL   COMPLETE METABOLIC PANEL WITH GFR   CBC   Other Visit Diagnoses     Right arm pain           Right arm pain-it is not truly a pain is much as a vibration sensation it sounds like it is a nerve that may be triggered may be even the ulnar nerve in her elbow with pressure on that elbow.  Just encouraged her to keep an eye on it over the next couple of weeks and avoid any pressure directly on the elbow and see if it improves.  If it persists or worsens then please let us know.  Return in about 4 months (around 12/19/2021) for Diabetes follow-up.    Beatrice Lecher, MD

## 2021-08-19 NOTE — Assessment & Plan Note (Signed)
A1c looks great but she has had some difficulty getting Ozempic.  We discussed possibly going up to 1 mg at next refill.  She called the insurance company and they said that they needed additional information from [place].  Lab Results  Component Value Date   HGBA1C 6.3 08/19/2021

## 2021-08-19 NOTE — Assessment & Plan Note (Signed)
Blood pressure looks fantastic today.

## 2021-08-20 LAB — CBC
HCT: 38.2 % (ref 35.0–45.0)
Hemoglobin: 11.5 g/dL — ABNORMAL LOW (ref 11.7–15.5)
MCH: 22 pg — ABNORMAL LOW (ref 27.0–33.0)
MCHC: 30.1 g/dL — ABNORMAL LOW (ref 32.0–36.0)
MCV: 73 fL — ABNORMAL LOW (ref 80.0–100.0)
MPV: 11 fL (ref 7.5–12.5)
Platelets: 357 10*3/uL (ref 140–400)
RBC: 5.23 10*6/uL — ABNORMAL HIGH (ref 3.80–5.10)
RDW: 14.5 % (ref 11.0–15.0)
WBC: 6.3 10*3/uL (ref 3.8–10.8)

## 2021-08-20 LAB — COMPLETE METABOLIC PANEL WITH GFR
AG Ratio: 1.3 (calc) (ref 1.0–2.5)
ALT: 19 U/L (ref 6–29)
AST: 22 U/L (ref 10–35)
Albumin: 4.1 g/dL (ref 3.6–5.1)
Alkaline phosphatase (APISO): 76 U/L (ref 37–153)
BUN: 21 mg/dL (ref 7–25)
CO2: 29 mmol/L (ref 20–32)
Calcium: 9 mg/dL (ref 8.6–10.4)
Chloride: 102 mmol/L (ref 98–110)
Creat: 0.92 mg/dL (ref 0.50–1.05)
Globulin: 3.1 g/dL (calc) (ref 1.9–3.7)
Glucose, Bld: 106 mg/dL (ref 65–139)
Potassium: 4.8 mmol/L (ref 3.5–5.3)
Sodium: 138 mmol/L (ref 135–146)
Total Bilirubin: 0.5 mg/dL (ref 0.2–1.2)
Total Protein: 7.2 g/dL (ref 6.1–8.1)
eGFR: 70 mL/min/{1.73_m2} (ref >=60)

## 2021-08-20 LAB — TSH: TSH: 6.07 mIU/L — ABNORMAL HIGH (ref 0.40–4.50)

## 2021-08-20 LAB — LIPID PANEL W/REFLEX DIRECT LDL
Cholesterol: 216 mg/dL — ABNORMAL HIGH (ref <200)
HDL: 56 mg/dL (ref >=50)
LDL Cholesterol (Calc): 137 mg/dL (calc) — ABNORMAL HIGH
Non-HDL Cholesterol (Calc): 160 mg/dL (calc) — ABNORMAL HIGH (ref <130)
Total CHOL/HDL Ratio: 3.9 (calc) (ref <5.0)
Triglycerides: 109 mg/dL (ref <150)

## 2021-08-20 NOTE — Progress Notes (Signed)
Hi Martha Woods, TSH does look much better.  It was 16 last year it is now down to 6.  I still went to get you closer to 2.  Continue to just work on taking the medication very consistently and at least an hour away from food or other medications at least 4 hours away from any vitamins or minerals.  I am going to have you take an extra half a tab once a week.  So for example on Sundays you would take 1-1/2.  The other 6 days a week you would take a whole tab and then we will plan to recheck your level again in 2 months.  Cholesterol has jumped up significantly.  Not sure if maybe you had missed her statin while on vacation but just encourage you to continue to take it regularly.  Metabolic panel is normal.  Hemoglobin is stable.

## 2021-09-07 ENCOUNTER — Encounter: Payer: Self-pay | Admitting: Family Medicine

## 2021-09-30 ENCOUNTER — Telehealth: Payer: Self-pay

## 2021-09-30 NOTE — Telephone Encounter (Signed)
Initiated Prior authorization [id number] (0.25 or 0.5 MG/DOSE) '2MG'$ /3ML pen-injectors Via: Covermymeds Case/Key: [id number] Status: approved  as of 09/30/21 Reason:no pa was required  Notified Pt via: Mychart

## 2021-10-04 ENCOUNTER — Other Ambulatory Visit: Payer: Self-pay | Admitting: Family Medicine

## 2021-10-04 DIAGNOSIS — E890 Postprocedural hypothyroidism: Secondary | ICD-10-CM

## 2021-10-09 ENCOUNTER — Other Ambulatory Visit: Payer: Self-pay | Admitting: *Deleted

## 2021-10-09 DIAGNOSIS — E119 Type 2 diabetes mellitus without complications: Secondary | ICD-10-CM

## 2021-10-09 MED ORDER — SEMAGLUTIDE (1 MG/DOSE) 4 MG/3ML ~~LOC~~ SOPN: 1.0000 mg | PEN_INJECTOR | SUBCUTANEOUS | 1 refills | Status: DC

## 2021-10-09 NOTE — Telephone Encounter (Signed)
Did this 1 make it through?

## 2021-10-30 LAB — HM DIABETES EYE EXAM

## 2021-12-18 ENCOUNTER — Encounter: Payer: Self-pay | Admitting: Family Medicine

## 2021-12-19 ENCOUNTER — Other Ambulatory Visit: Payer: Self-pay | Admitting: Family Medicine

## 2021-12-19 DIAGNOSIS — E890 Postprocedural hypothyroidism: Secondary | ICD-10-CM

## 2021-12-20 ENCOUNTER — Encounter: Payer: Self-pay | Admitting: Family Medicine

## 2021-12-20 ENCOUNTER — Ambulatory Visit: Payer: BC Managed Care – PPO | Admitting: Family Medicine

## 2021-12-20 VITALS — BP 142/83 | HR 92 | Ht 64.0 in | Wt 201.0 lb

## 2021-12-20 DIAGNOSIS — E890 Postprocedural hypothyroidism: Secondary | ICD-10-CM | POA: Diagnosis not present

## 2021-12-20 DIAGNOSIS — E119 Type 2 diabetes mellitus without complications: Secondary | ICD-10-CM | POA: Diagnosis not present

## 2021-12-20 DIAGNOSIS — Z23 Encounter for immunization: Secondary | ICD-10-CM

## 2021-12-20 DIAGNOSIS — I10 Essential (primary) hypertension: Secondary | ICD-10-CM | POA: Diagnosis not present

## 2021-12-20 LAB — POCT GLYCOSYLATED HEMOGLOBIN (HGB A1C): Hemoglobin A1C: 7.2 % — AB (ref 4.0–5.6)

## 2021-12-20 NOTE — Assessment & Plan Note (Signed)
1C was up slightly today to a little over 7.  Last A1c was 6.3.  Encouraged her to just continue to work on healthy food choices and trying to stay active even if it is just stretching if she is not able to do a lot of walking because of her hip.

## 2021-12-20 NOTE — Assessment & Plan Note (Signed)
Repeat TSH today.  Not quite at goal but it was improving when we checked it in August.  Again hopefully with rate reduction were little closer to normal she is taking the extra half a tab 1 day a week.

## 2021-12-20 NOTE — Assessment & Plan Note (Signed)
Blood pressure is a little borderline today.  It looked great back in August and she is actually down 10 pounds some little hesitant to adjust her medication today.  Will check it again when I see her back in 3 months.  If at that point it still elevated we can make an adjustment to her regimen but I suspect she will continue to lose weight and do well.

## 2021-12-20 NOTE — Progress Notes (Signed)
Established Patient Office Visit  Subjective   Patient ID: Martha Woods, female    DOB: [birth year]  Age: 61 y.o. MRN: [id number]  Chief Complaint  Patient presents with   Diabetes    HPI  Diabetes follow-up-since going up on the [place] in October she is just had a lot of bloating epigastric discomfort.  She already had a previous prescription for omeprazole also started taking it counted as needed.  It does help a little.  She would really like to consider going back down on the medication she just felt much better.  She said most of the time she only eats twice a day because she does tend to sleep in and then when she eats breakfast she is really not hungry until the evening she feels like overall she is making good food choices.  She really has not been able to exercise because of some left hip pain.  He is also noticed that she is more constipated since going up on the dose normally she has a bowel movement daily and outs every 3 days.  She has lost 10 pounds.    ROS    Objective:     BP (!) 142/83   Pulse 92   Ht '5\' 4"'$  (1.626 m)   Wt 201 lb (91.2 kg)   LMP 12/26/2011   SpO2 94%   BMI 34.50 kg/m    Physical Exam Vitals and nursing note reviewed.  Constitutional:      Appearance: She is well-developed.  HENT:     Head: Normocephalic and atraumatic.  Eyes:     Conjunctiva/sclera: Conjunctivae normal.  Cardiovascular:     Rate and Rhythm: Normal rate and regular rhythm.     Heart sounds: Normal heart sounds.  Pulmonary:     Effort: Pulmonary effort is normal.     Breath sounds: Normal breath sounds.  Skin:    General: Skin is warm and dry.     Coloration: Skin is not pale.  Neurological:     Mental Status: She is alert and oriented to person, place, and time.  Psychiatric:        Behavior: Behavior normal.      Results for orders placed or performed in visit on 12/20/21  POCT glycosylated hemoglobin (Hb A1C)  Result Value Ref Range    Hemoglobin A1C 7.2 (A) 4.0 - 5.6 %   HbA1c POC (<> result, manual entry)     HbA1c, POC (prediabetic range)     HbA1c, POC (controlled diabetic range)        The 10-year ASCVD risk score (Arnett DK, et al., 2019) is: 13%    Assessment & Plan:   Problem List Items Addressed This Visit       Cardiovascular and Mediastinum   HYPERTENSION, BENIGN SYSTEMIC    Blood pressure is a little borderline today.  It looked great back in August and she is actually down 10 pounds some little hesitant to adjust her medication today.  Will check it again when I see her back in 3 months.  If at that point it still elevated we can make an adjustment to her regimen but I suspect she will continue to lose weight and do well.        Endocrine   Type 2 diabetes mellitus without complication, without long-term current use of insulin (HCC) - Primary    1C was up slightly today to a little over 7.  Last A1c was 6.3.  Encouraged her to  just continue to work on healthy food choices and trying to stay active even if it is just stretching if she is not able to do a lot of walking because of her hip.      Relevant Orders   POCT glycosylated hemoglobin (Hb A1C) (Completed)   Post-surgical hypothyroidism    Repeat TSH today.  Not quite at goal but it was improving when we checked it in August.  Again hopefully with rate reduction were little closer to normal she is taking the extra half a tab 1 day a week.      Relevant Orders   TSH   Other Visit Diagnoses     Need for immunization against influenza       Relevant Orders   Flu Vaccine QUAD 58moIM (Fluarix, Fluzone & Alfiuria Quad PF) (Completed)   Encounter for immunization       Relevant Orders   Pfizer Fall 2023 Covid-19 Vaccine 129yrand older (Completed)       Return in about 3 months (around 03/21/2022) for Diabetes follow-up.    CaBeatrice LecherMD

## 2021-12-20 NOTE — Patient Instructions (Signed)
On the Ozempic pen use 36 clicks for each injection.

## 2021-12-21 LAB — TSH: TSH: <0.01 mIU/L — ABNORMAL LOW (ref 0.40–4.50)

## 2021-12-23 ENCOUNTER — Encounter: Payer: Self-pay | Admitting: Family Medicine

## 2021-12-23 NOTE — Progress Notes (Signed)
Martha Woods, your TSH was actually little overly suppressed.  So we may actually make a slight adjustment to your regimen.  If you could just verify how you are taking the medication I think you are just taking the extra half a tab 1 day a week but I want to clarify before I make a change.  And then we will plan to recheck your level again in about 3 months.

## 2021-12-24 NOTE — Progress Notes (Signed)
Okay to drop off the extra half a tab and just take the whole tab daily and then plan to recheck again in 6 to 8 weeks.  Try to be consistent with dosing.

## 2022-01-14 ENCOUNTER — Other Ambulatory Visit: Payer: Self-pay | Admitting: Family Medicine

## 2022-01-14 DIAGNOSIS — Z1231 Encounter for screening mammogram for malignant neoplasm of breast: Secondary | ICD-10-CM

## 2022-01-22 ENCOUNTER — Ambulatory Visit (INDEPENDENT_AMBULATORY_CARE_PROVIDER_SITE_OTHER): Payer: BC Managed Care – PPO

## 2022-01-22 DIAGNOSIS — Z1231 Encounter for screening mammogram for malignant neoplasm of breast: Secondary | ICD-10-CM

## 2022-01-23 NOTE — Progress Notes (Signed)
Please call patient. Normal mammogram.  Repeat in 1 year.  

## 2022-02-18 ENCOUNTER — Other Ambulatory Visit: Payer: Self-pay | Admitting: Family Medicine

## 2022-02-23 ENCOUNTER — Other Ambulatory Visit: Payer: Self-pay | Admitting: Family Medicine

## 2022-02-23 DIAGNOSIS — E119 Type 2 diabetes mellitus without complications: Secondary | ICD-10-CM

## 2022-02-23 DIAGNOSIS — I10 Essential (primary) hypertension: Secondary | ICD-10-CM

## 2022-02-23 DIAGNOSIS — E7800 Pure hypercholesterolemia, unspecified: Secondary | ICD-10-CM

## 2022-03-02 ENCOUNTER — Other Ambulatory Visit: Payer: Self-pay | Admitting: Sports Medicine

## 2022-03-02 DIAGNOSIS — E890 Postprocedural hypothyroidism: Secondary | ICD-10-CM

## 2022-03-25 ENCOUNTER — Ambulatory Visit: Payer: BC Managed Care – PPO | Admitting: Family Medicine

## 2022-03-25 ENCOUNTER — Encounter: Payer: Self-pay | Admitting: Family Medicine

## 2022-03-25 VITALS — BP 135/73 | HR 88 | Ht 64.0 in | Wt 195.0 lb

## 2022-03-25 DIAGNOSIS — Z1211 Encounter for screening for malignant neoplasm of colon: Secondary | ICD-10-CM

## 2022-03-25 DIAGNOSIS — E890 Postprocedural hypothyroidism: Secondary | ICD-10-CM | POA: Diagnosis not present

## 2022-03-25 DIAGNOSIS — I10 Essential (primary) hypertension: Secondary | ICD-10-CM

## 2022-03-25 DIAGNOSIS — E119 Type 2 diabetes mellitus without complications: Secondary | ICD-10-CM | POA: Diagnosis not present

## 2022-03-25 DIAGNOSIS — H01006 Unspecified blepharitis left eye, unspecified eyelid: Secondary | ICD-10-CM

## 2022-03-25 DIAGNOSIS — K921 Melena: Secondary | ICD-10-CM | POA: Diagnosis not present

## 2022-03-25 DIAGNOSIS — H01003 Unspecified blepharitis right eye, unspecified eyelid: Secondary | ICD-10-CM

## 2022-03-25 LAB — POCT GLYCOSYLATED HEMOGLOBIN (HGB A1C): Hemoglobin A1C: 6.2 % — AB (ref 4.0–5.6)

## 2022-03-25 MED ORDER — HYDROCORTISONE VALERATE 0.2 % EX CREA: 1.0000 | TOPICAL_CREAM | Freq: Every day | CUTANEOUS | 0 refills | Status: DC | PRN

## 2022-03-25 NOTE — Addendum Note (Signed)
Addended by: Beatrice Lecher D on: 03/25/2022 11:40 AM   Modules accepted: Orders

## 2022-03-25 NOTE — Assessment & Plan Note (Signed)
Last TSH was too low. Recheck today.

## 2022-03-25 NOTE — Progress Notes (Addendum)
Established Patient Office Visit  Subjective   Patient ID: Martha Woods, female    DOB: July 19, 1960  Age: 62 y.o. MRN: [id number]  Chief Complaint  Patient presents with   Diabetes    HPI Diabetes - no hypoglycemic events. No wounds or sores that are not healing well. No increased thirst or urination. Checking glucose at home. Taking medications as prescribed without any side effects.  Hypertension- Pt denies chest pain, SOB, dizziness, or heart palpitations.  Taking meds as directed w/o problems.  Denies medication side effects.    Hypothyroidism - Taking medication regularly in the AM away from food and vitamins, etc. No recent change to skin, hair, or energy levels.  We had her try about the extra half a tablet she was taking weekly.  To be consistent with dosing.  Lab Results  Component Value Date   TSH <0.01 (L) 12/20/2021    She is also noticed some dark stools recently.  She says on February 28 she woke up with a bout of diarrhea.  She had 3 more episodes that day and then went a few days without having another bowel movement but by the time she had another bowel movement her stools were very black.  No sticky or tarry nature.  She decided to stop her iron knowing that that could darken her stools even though she has been on it for a long time and the stools have started to lighten up.  Does occasionally take Aleve but not very often.  She does take a baby aspirin daily.  She has not had any epigastric pain or discomfort.  No gross stool in the blood.  She is also notes that for about the last months she has had watery itchy eyes particularly in the lateral corners of her eyes.  She spoke with the local pharmacist who recommended ketoprofen eyedrops.  She says they have helped with the itching but not the watering.  And now she is starting to get a rash at the corner of her eyes.  Did make an appointment for later this week with her eye doctor.  ROS    Objective:      BP 135/73   Pulse 88   Ht '5\' 4"'$  (1.626 m)   Wt 195 lb (88.5 kg)   LMP 12/26/2011   SpO2 99%   BMI 33.47 kg/m    Physical Exam Vitals and nursing note reviewed.  Constitutional:      Appearance: She is well-developed.  HENT:     Head: Normocephalic and atraumatic.  Cardiovascular:     Rate and Rhythm: Normal rate and regular rhythm.     Heart sounds: Normal heart sounds.  Pulmonary:     Effort: Pulmonary effort is normal.     Breath sounds: Normal breath sounds.  Skin:    General: Skin is warm and dry.  Neurological:     Mental Status: She is alert and oriented to person, place, and time.  Psychiatric:        Behavior: Behavior normal.      Results for orders placed or performed in visit on 03/25/22  POCT glycosylated hemoglobin (Hb A1C)  Result Value Ref Range   Hemoglobin A1C 6.2 (A) 4.0 - 5.6 %   HbA1c POC (<> result, manual entry)     HbA1c, POC (prediabetic range)     HbA1c, POC (controlled diabetic range)        The 10-year ASCVD risk score (Arnett DK, et al., 2019) is:  13.1%    Assessment & Plan:   Problem List Items Addressed This Visit       Cardiovascular and Mediastinum   HYPERTENSION, BENIGN SYSTEMIC    Bp better today.  She forgot to take her meds before she left home today. Due for CMP.       Relevant Orders   TSH   BASIC METABOLIC PANEL WITH GFR     Endocrine   Type 2 diabetes mellitus without complication, without long-term current use of insulin ([facility]) - Primary    Much improved with A1C of 6.2.  continue current regimen. She is still on half dose of Ozempic.        Relevant Orders   POCT glycosylated hemoglobin (Hb A1C) (Completed)   TSH   BASIC METABOLIC PANEL WITH GFR   Post-surgical hypothyroidism    Last TSH was too low. Recheck today.       Relevant Orders   TSH   BASIC METABOLIC PANEL WITH GFR   Other Visit Diagnoses     Black stools       Relevant Orders   Ambulatory referral to Gastroenterology   Blepharitis  of both eyes, unspecified eyelid, unspecified type       Relevant Medications   hydrocortisone valerate cream (WESTCORT) 0.2 %   Colon cancer screening       Relevant Orders   Ambulatory referral to Gastroenterology      She is getting a little bit of a rash on the corners of the eyes probably from the excessive watering.  She does have an appoint with her eye doctor later this week she may benefit from something like Pataday or Patanol but I will leave the additional workup to them.  In the short-term we did discuss using a very mild corticosteroid underneath the eye at all and just along the corners but not in the eye.  Avoid applying to the upper eyelid.  We did discuss to avoid getting it in the eye at all.  But we can at least use this short-term to help.  Black stools-unclear etiology.  It does not sound like gastritis or GERD or an ulcer.  Could be related to the iron but this is not new and she has been on the iron for quite some time.  Also consider she could have had a ruptured AVM or something like that that might of caused it.  I do wonder if she may have had a viral illness that caused the initial episodes of diarrhea.  Recommend testing stool for blood and if negative then okay to resume iron.  She is overdue for her repeat colonoscopy.  Will go ahead and place referral.  Return in about 4 months (around 07/25/2022) for Diabetes follow-up, Hypertension.    Beatrice Lecher, MD

## 2022-03-25 NOTE — Assessment & Plan Note (Signed)
Much improved with A1C of 6.2.  continue current regimen. She is still on half dose of Ozempic.

## 2022-03-25 NOTE — Assessment & Plan Note (Signed)
Bp better today.  She forgot to take her meds before she left home today. Due for CMP.

## 2022-03-26 ENCOUNTER — Other Ambulatory Visit: Payer: Self-pay | Admitting: Family Medicine

## 2022-03-26 DIAGNOSIS — E890 Postprocedural hypothyroidism: Secondary | ICD-10-CM

## 2022-03-26 LAB — BASIC METABOLIC PANEL WITH GFR
BUN: 21 mg/dL (ref 7–25)
CO2: 27 mmol/L (ref 20–32)
Calcium: 9.3 mg/dL (ref 8.6–10.4)
Chloride: 105 mmol/L (ref 98–110)
Creat: 0.72 mg/dL (ref 0.50–1.05)
Glucose, Bld: 117 mg/dL — ABNORMAL HIGH (ref 65–99)
Potassium: 4.8 mmol/L (ref 3.5–5.3)
Sodium: 142 mmol/L (ref 135–146)
eGFR: 94 mL/min/{1.73_m2} (ref >=60)

## 2022-03-26 LAB — TSH: TSH: <0.01 mIU/L — ABNORMAL LOW (ref 0.40–4.50)

## 2022-03-26 MED ORDER — LEVOTHYROXINE SODIUM 175 MCG PO TABS: 175.0000 ug | ORAL_TABLET | Freq: Every day | ORAL | 0 refills | Status: DC

## 2022-03-26 NOTE — Progress Notes (Signed)
Hi Martha Woods, your thyroid is still overly suppressed.  We had just refilled the thyroid medication yesterday.  I did send out a new 1 today for 175.  They may end up shipping you both am not sure.  But I want you to take the 175 mcg daily for 6 weeks and on the lets recheck it.  I am not sure why all of a sudden there is a big shift in your level of thought by making that small change of dropping off the half tab we would not be in a perfect spot.

## 2022-04-28 ENCOUNTER — Encounter: Payer: Self-pay | Admitting: *Deleted

## 2022-06-16 ENCOUNTER — Other Ambulatory Visit: Payer: Self-pay | Admitting: Family Medicine

## 2022-06-16 DIAGNOSIS — E890 Postprocedural hypothyroidism: Secondary | ICD-10-CM

## 2022-07-01 ENCOUNTER — Other Ambulatory Visit: Payer: Self-pay | Admitting: Family Medicine

## 2022-07-01 DIAGNOSIS — E890 Postprocedural hypothyroidism: Secondary | ICD-10-CM

## 2022-07-01 DIAGNOSIS — E119 Type 2 diabetes mellitus without complications: Secondary | ICD-10-CM

## 2022-08-12 ENCOUNTER — Ambulatory Visit: Payer: BC Managed Care – PPO | Admitting: Family Medicine

## 2022-08-12 ENCOUNTER — Encounter: Payer: Self-pay | Admitting: Family Medicine

## 2022-08-12 VITALS — BP 125/74 | HR 86 | Ht 64.0 in | Wt 193.0 lb

## 2022-08-12 DIAGNOSIS — E7800 Pure hypercholesterolemia, unspecified: Secondary | ICD-10-CM

## 2022-08-12 DIAGNOSIS — I10 Essential (primary) hypertension: Secondary | ICD-10-CM | POA: Diagnosis not present

## 2022-08-12 DIAGNOSIS — Z7984 Long term (current) use of oral hypoglycemic drugs: Secondary | ICD-10-CM

## 2022-08-12 DIAGNOSIS — M180 Bilateral primary osteoarthritis of first carpometacarpal joints: Secondary | ICD-10-CM

## 2022-08-12 DIAGNOSIS — E890 Postprocedural hypothyroidism: Secondary | ICD-10-CM | POA: Diagnosis not present

## 2022-08-12 DIAGNOSIS — M4697 Unspecified inflammatory spondylopathy, lumbosacral region: Secondary | ICD-10-CM

## 2022-08-12 DIAGNOSIS — E119 Type 2 diabetes mellitus without complications: Secondary | ICD-10-CM | POA: Diagnosis not present

## 2022-08-12 LAB — POCT GLYCOSYLATED HEMOGLOBIN (HGB A1C): Hemoglobin A1C: 6.1 % — AB (ref 4.0–5.6)

## 2022-08-12 MED ORDER — TRAMADOL HCL 50 MG PO TABS: 50.0000 mg | ORAL_TABLET | Freq: Four times a day (QID) | ORAL | 0 refills | Status: DC | PRN

## 2022-08-12 NOTE — Addendum Note (Signed)
Addended by: Nani Gasser D on: 08/12/2022 12:59 PM   Modules accepted: Orders

## 2022-08-12 NOTE — Assessment & Plan Note (Signed)
Check TSH.  She has been taking it regularly she said she did miss a couple of doses when she was on her cruise but otherwise has been very consistent.

## 2022-08-12 NOTE — Addendum Note (Signed)
Addended by: Deno Etienne on: 08/12/2022 11:28 AM   Modules accepted: Orders

## 2022-08-12 NOTE — Assessment & Plan Note (Signed)
Well controlled. Continue current regimen. Follow up in  4-6 mo 

## 2022-08-12 NOTE — Progress Notes (Addendum)
Established Patient Office Visit  Subjective   Patient ID: Martha Woods, female    DOB: June 27, 1960  Age: 62 y.o. MRN: [id number]  Chief Complaint  Patient presents with   Diabetes    HPI  Hypertension- Pt denies chest pain, SOB, dizziness, or heart palpitations.  Taking meds as directed w/o problems.  Denies medication side effects.    Diabetes - no hypoglycemic events. No wounds or sores that are not healing well. No increased thirst or urination. Checking glucose at home. Taking medications as prescribed without any side effects.  Last TSH was overly suppressed.  So I adjusted her thyroid medication for 175 mcg 7 days a week.  She is tolerating that dose well.  She has had her thyroid removed.  Also like a refill on her tramadol.  She says she still has most of the bottle from last year but she just likes to have it on hand in case she has a flare with her migraines or her arthritis.    ROS    Objective:     BP 125/74   Pulse 86   Ht 5\' 4"  (1.626 m)   Wt 193 lb (87.5 kg)   LMP 12/26/2011   SpO2 100%   BMI 33.13 kg/m    Physical Exam Vitals and nursing note reviewed.  Constitutional:      Appearance: She is well-developed.  HENT:     Head: Normocephalic and atraumatic.  Cardiovascular:     Rate and Rhythm: Normal rate and regular rhythm.     Heart sounds: Normal heart sounds.  Pulmonary:     Effort: Pulmonary effort is normal.     Breath sounds: Normal breath sounds.  Skin:    General: Skin is warm and dry.  Neurological:     Mental Status: She is alert and oriented to person, place, and time.  Psychiatric:        Behavior: Behavior normal.      Results for orders placed or performed in visit on 08/12/22  POCT glycosylated hemoglobin (Hb A1C)  Result Value Ref Range   Hemoglobin A1C 6.1 (A) 4.0 - 5.6 %   HbA1c POC (<> result, manual entry)     HbA1c, POC (prediabetic range)     HbA1c, POC (controlled diabetic range)        The  10-year ASCVD risk score (Arnett DK, et al., 2019) is: 11.3%    Assessment & Plan:   Problem List Items Addressed This Visit       Cardiovascular and Mediastinum   HYPERTENSION, BENIGN SYSTEMIC    Well controlled. Continue current regimen. Follow up in  4- 62mo       Relevant Orders   POCT glycosylated hemoglobin (Hb A1C) (Completed)   TSH   Lipid Panel With LDL/HDL Ratio   Urine Microalbumin w/creat. ratio     Endocrine   Type 2 diabetes mellitus without complication, without long-term current use of insulin (HCC) - Primary    Well controlled. Continue current regimen. Follow up in  4 months. Only doing half her ozempic because of constipation.  Discussed possibly using a softener with the 1 mg if she decides to go back on it.  If she does go back up then we may even be able to discontinue the metformin.  Continue ACE inhibitor and statin.      Relevant Orders   POCT glycosylated hemoglobin (Hb A1C) (Completed)   TSH   Lipid Panel With LDL/HDL Ratio   Urine  Microalbumin w/creat. ratio   Post-surgical hypothyroidism    Check TSH.  She has been taking it regularly she said she did miss a couple of doses when she was on her cruise but otherwise has been very consistent.      Relevant Orders   POCT glycosylated hemoglobin (Hb A1C) (Completed)   TSH   Lipid Panel With LDL/HDL Ratio   Urine Microalbumin w/creat. ratio     Musculoskeletal and Integument   Unspecified inflammatory spondylopathy, lumbosacral region (HCC)    NO recent changes.       Primary osteoarthritis of both first carpometacarpal joints   Relevant Medications   traMADol (ULTRAM) 50 MG tablet     Other   Hyperlipidemia   Relevant Orders   POCT glycosylated hemoglobin (Hb A1C) (Completed)   TSH   Lipid Panel With LDL/HDL Ratio   Urine Microalbumin w/creat. ratio   Has rescheduled her colonoscopy.  Return in about 4 months (around 12/13/2022) for Diabetes follow-up, Thyroid .    Nani Gasser, MD

## 2022-08-12 NOTE — Assessment & Plan Note (Addendum)
Well controlled. Continue current regimen. Follow up in  4 months. Only doing half her ozempic because of constipation.  Discussed possibly using a softener with the 1 mg if she decides to go back on it.  If she does go back up then we may even be able to discontinue the metformin.  Continue ACE inhibitor and statin.

## 2022-08-12 NOTE — Assessment & Plan Note (Signed)
NO recent changes.

## 2022-08-13 ENCOUNTER — Other Ambulatory Visit: Payer: Self-pay | Admitting: *Deleted

## 2022-08-13 DIAGNOSIS — M180 Bilateral primary osteoarthritis of first carpometacarpal joints: Secondary | ICD-10-CM

## 2022-08-13 MED ORDER — TRAMADOL HCL 50 MG PO TABS
50.0000 mg | ORAL_TABLET | Freq: Four times a day (QID) | ORAL | 0 refills | Status: AC | PRN
Start: 2022-08-13 — End: ?

## 2022-08-13 NOTE — Progress Notes (Signed)
HI Martha Woods, your thyroid looks better but not optimal.  Please verify dose. . Cholesterol looks good.

## 2022-08-13 NOTE — Progress Notes (Signed)
Molli Knock, take a half a tab on Sundays and a whole tab the other days of the week.  Repeat that cycle weekly and then lets plan to recheck your level again in 2 months.

## 2022-09-08 IMAGING — MG MM DIGITAL SCREENING BILAT W/ TOMO AND CAD
6 of 10 series · 6 of 30 positions shown · non-contrast
Comparison: Previous exam(s).

CLINICAL DATA: Screening.

EXAM:
DIGITAL SCREENING BILATERAL MAMMOGRAM WITH TOMOSYNTHESIS AND CAD
TECHNIQUE: Bilateral screening digital craniocaudal and mediolateral oblique
mammograms were obtained. Bilateral screening digital breast
tomosynthesis was performed. The images were evaluated with
computer-aided detection.

[L CC synth-2D]
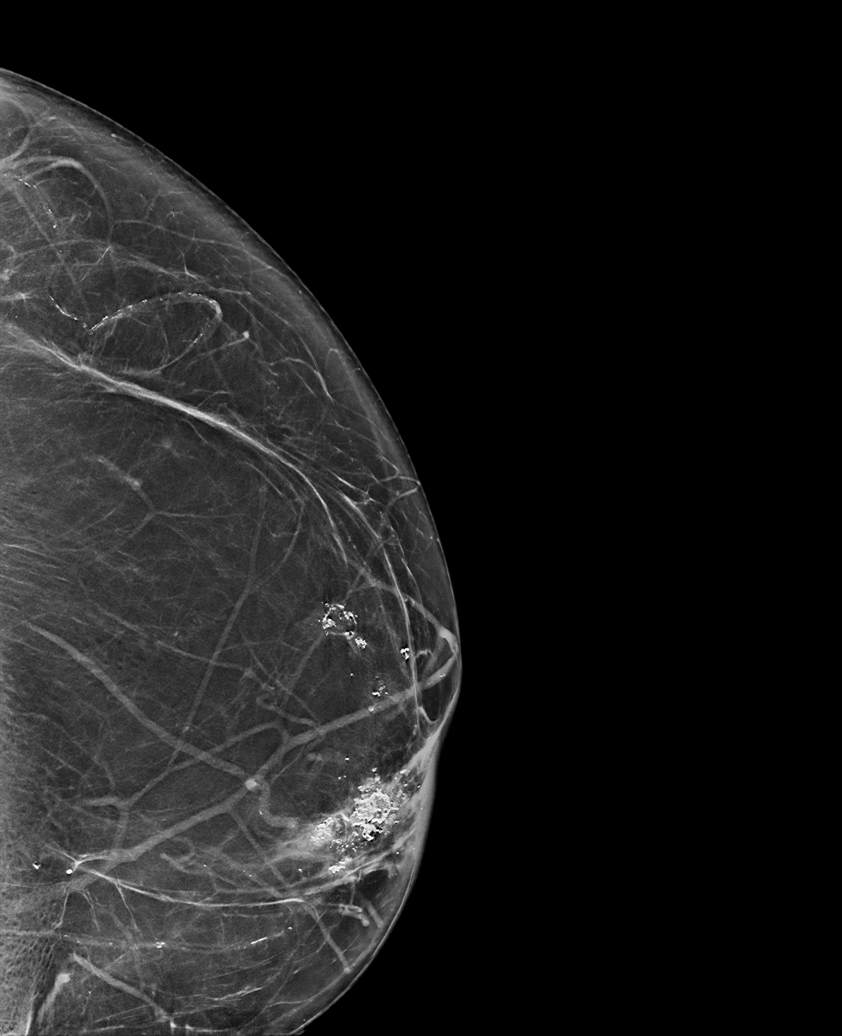

[R MLO synth-2D]
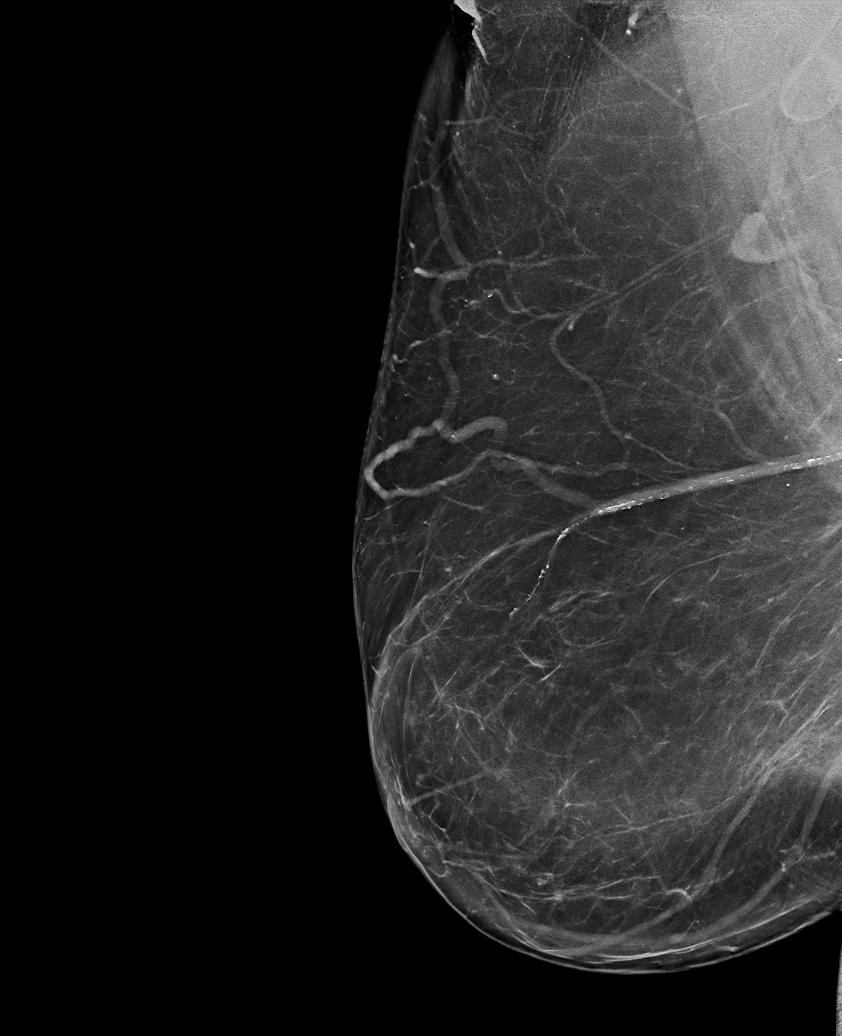

[R CC synth-2D]
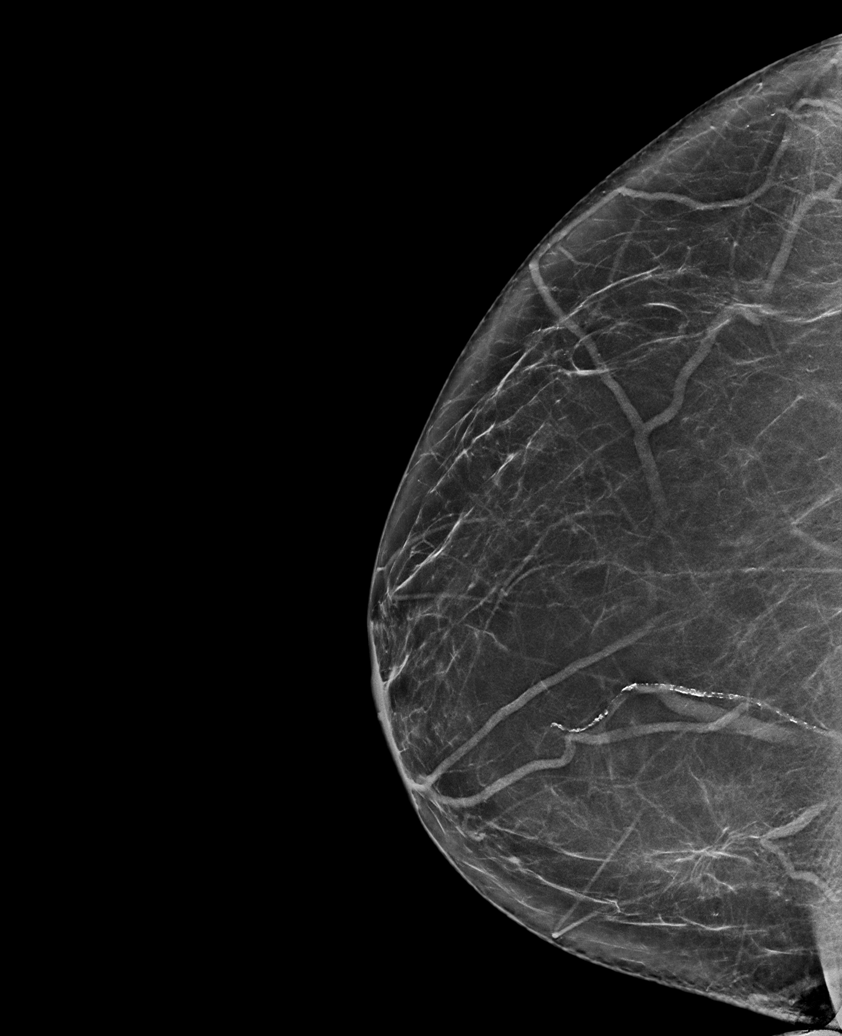

[L CV synth-2D]
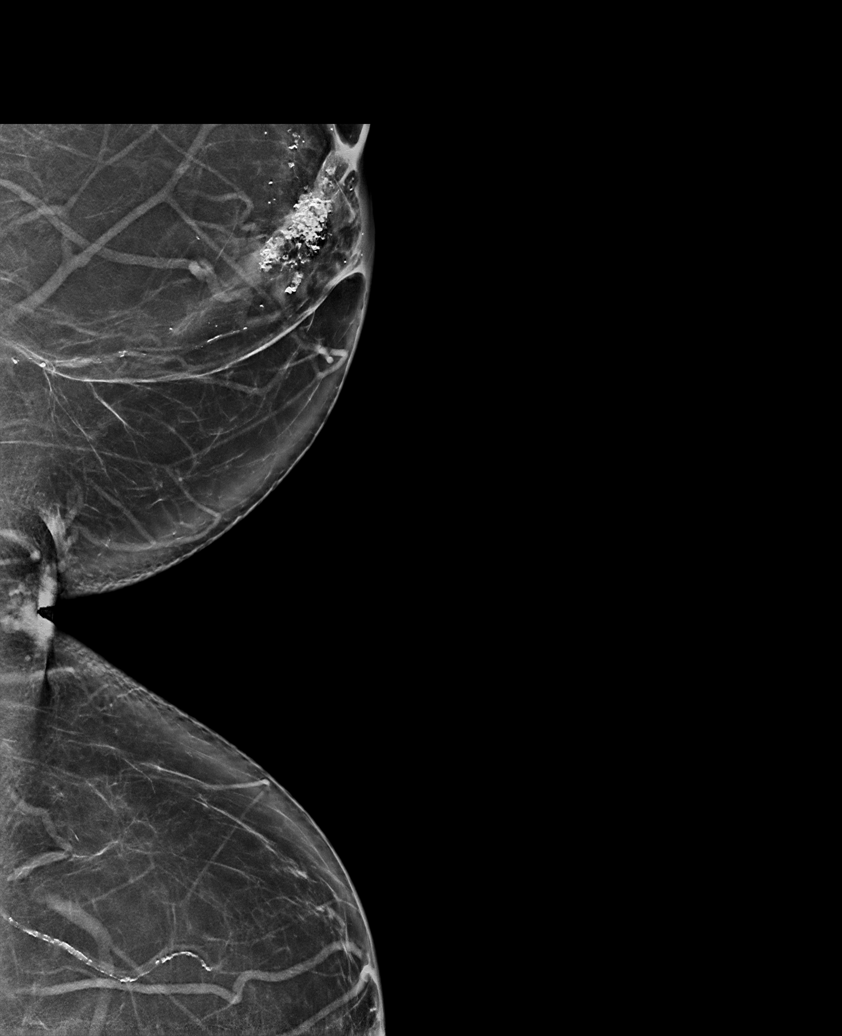

[L MLO synth-2D]
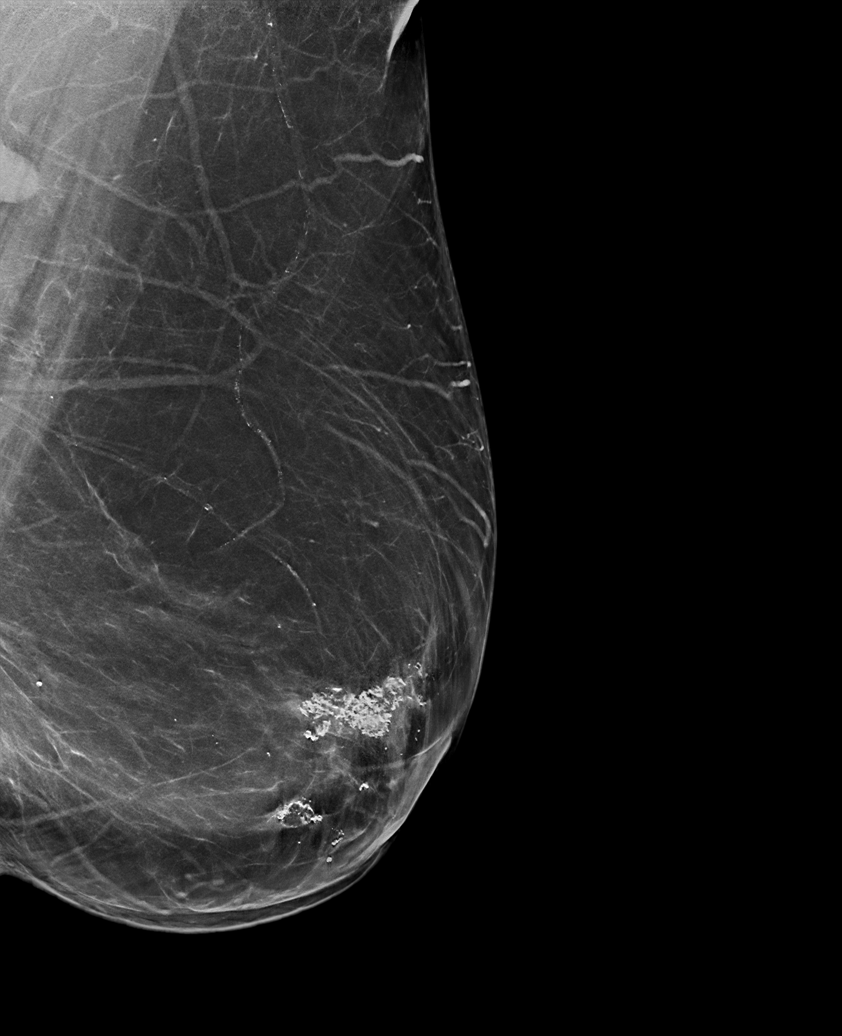

[L MLO tomo · tomo slice 48/95.0]
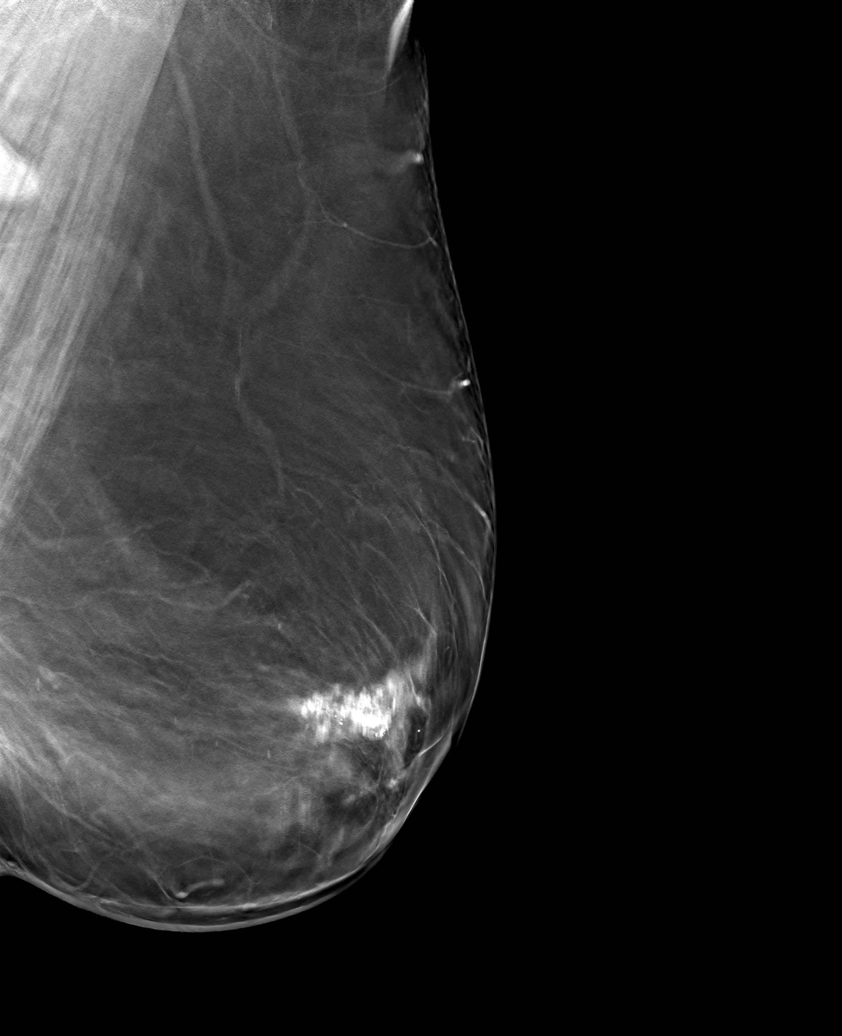

[6 of 30 positions shown; findings below may reference images not displayed]

ACR Breast Density Category b: There are scattered areas of
fibroglandular density.
FINDINGS: There are no findings suspicious for malignancy.
IMPRESSION: No mammographic evidence of malignancy. A result letter of this
screening mammogram will be mailed directly to the patient.

RECOMMENDATION:
Screening mammogram in one year. (Code:51-O-LD2)

BI-RADS CATEGORY  1: Negative.

## 2022-09-24 ENCOUNTER — Other Ambulatory Visit: Payer: Self-pay | Admitting: Family Medicine

## 2022-09-24 DIAGNOSIS — E7800 Pure hypercholesterolemia, unspecified: Secondary | ICD-10-CM

## 2022-09-24 DIAGNOSIS — I10 Essential (primary) hypertension: Secondary | ICD-10-CM

## 2022-09-24 DIAGNOSIS — E119 Type 2 diabetes mellitus without complications: Secondary | ICD-10-CM

## 2022-10-07 LAB — HM COLONOSCOPY

## 2022-11-25 ENCOUNTER — Other Ambulatory Visit: Payer: Self-pay | Admitting: Family Medicine

## 2022-11-25 DIAGNOSIS — E119 Type 2 diabetes mellitus without complications: Secondary | ICD-10-CM

## 2022-12-15 ENCOUNTER — Ambulatory Visit: Payer: BC Managed Care – PPO | Admitting: Family Medicine

## 2022-12-15 ENCOUNTER — Encounter: Payer: Self-pay | Admitting: Family Medicine

## 2022-12-15 VITALS — BP 138/82 | HR 80 | Ht 64.0 in | Wt 196.0 lb

## 2022-12-15 DIAGNOSIS — Z23 Encounter for immunization: Secondary | ICD-10-CM

## 2022-12-15 DIAGNOSIS — E890 Postprocedural hypothyroidism: Secondary | ICD-10-CM

## 2022-12-15 DIAGNOSIS — I10 Essential (primary) hypertension: Secondary | ICD-10-CM

## 2022-12-15 DIAGNOSIS — Z7984 Long term (current) use of oral hypoglycemic drugs: Secondary | ICD-10-CM

## 2022-12-15 DIAGNOSIS — E119 Type 2 diabetes mellitus without complications: Secondary | ICD-10-CM

## 2022-12-15 LAB — POCT GLYCOSYLATED HEMOGLOBIN (HGB A1C): Hemoglobin A1C: 6.5 % — AB (ref 4.0–5.6)

## 2022-12-15 NOTE — Assessment & Plan Note (Signed)
To recheck TSH.

## 2022-12-15 NOTE — Progress Notes (Addendum)
Established Patient Office Visit  Subjective   Patient ID: Martha Woods, female    DOB: 10-Dec-1960  Age: 62 y.o. MRN: [id number]  No chief complaint on file.   HPI  Diabetes - no hypoglycemic events. No wounds or sores that are not healing well. No increased thirst or urination. Checking glucose at home. Taking medications as prescribed without any side effects.  Hypertension- Pt denies chest pain, SOB, dizziness, or heart palpitations.  Taking meds as directed w/o problems.  Denies medication side effects.    Been having a lot more problems with her shoulders again.  She previously saw the orthopedist over a year ago and they had discussed that eventually she would need surgery she did get some steroid injections done that were helpful.  She started flaring again as she has been doing a lot more heavy lifting.  Hypothyroidism - Taking medication regularly in the AM away from food and vitamins, etc. No recent change to skin, hair, or energy levels.  Still working part-time, 2 days/week.    ROS    Objective:     BP 138/82 Comment: Repeat BP  Pulse 80   Ht 5\' 4"  (1.626 m)   Wt 196 lb (88.9 kg)   LMP 12/26/2011   SpO2 100%   BMI 33.64 kg/m    Physical Exam Vitals and nursing note reviewed.  Constitutional:      Appearance: Normal appearance.  HENT:     Head: Normocephalic and atraumatic.  Eyes:     Conjunctiva/sclera: Conjunctivae normal.  Cardiovascular:     Rate and Rhythm: Normal rate and regular rhythm.  Pulmonary:     Effort: Pulmonary effort is normal.     Breath sounds: Normal breath sounds.  Skin:    General: Skin is warm and dry.  Neurological:     Mental Status: She is alert.  Psychiatric:        Mood and Affect: Mood normal.      Results for orders placed or performed in visit on 12/15/22  POCT HgB A1C  Result Value Ref Range   Hemoglobin A1C 6.5 (A) 4.0 - 5.6 %   HbA1c POC (<> result, manual entry)     HbA1c, POC (prediabetic  range)     HbA1c, POC (controlled diabetic range)        The 10-year ASCVD risk score (Arnett DK, et al., 2019) is: 10%    Assessment & Plan:   Problem List Items Addressed This Visit       Cardiovascular and Mediastinum   HYPERTENSION, BENIGN SYSTEMIC    Blood pressure little elevated will recheck again before she leaves today.      Relevant Orders   POCT HgB A1C (Completed)   TSH   CMP14+EGFR     Endocrine   Type 2 diabetes mellitus without complication, without long-term current use of insulin (HCC) - Primary    Well controlled, though up a little bit from previous.  6.5 today.  Continue to work on healthy diet and regular exercise.  She would like to continue on her current doses of Ozempic.  She feels her bowels are moving well.  Continue current regimen. Follow up in  4 mo       Relevant Orders   POCT HgB A1C (Completed)   TSH   CMP14+EGFR   Post-surgical hypothyroidism    To recheck TSH.      Relevant Orders   POCT HgB A1C (Completed)   TSH   CMP14+EGFR  Other Visit Diagnoses     Encounter for immunization       Relevant Orders   Flu vaccine trivalent PF, 6mos and older(Flulaval,Afluria,Fluarix,Fluzone) (Completed)   Pfizer Comirnaty Covid-19 Vaccine 35yrs & older (Completed)       Return in about 4 months (around 04/15/2023) for Hypertension, Diabetes follow-up.    Nani Gasser, MD   Diabetic Foot Exam - Simple   Simple Foot Form Diabetic Foot exam was performed with the following findings: Yes 12/15/2022 10:03 AM  Visual Inspection No deformities, no ulcerations, no other skin breakdown bilaterally: Yes Sensation Testing Intact to touch and monofilament testing bilaterally: Yes Pulse Check Posterior Tibialis and Dorsalis pulse intact bilaterally: Yes Comments

## 2022-12-15 NOTE — Assessment & Plan Note (Addendum)
Well controlled, though up a little bit from previous.  6.5 today.  Continue to work on healthy diet and regular exercise.  She would like to continue on her current doses of Ozempic.  She feels her bowels are moving well.  Continue current regimen. Follow up in  4 mo

## 2022-12-15 NOTE — Assessment & Plan Note (Signed)
Blood pressure little elevated will recheck again before she leaves today.

## 2022-12-16 ENCOUNTER — Encounter: Payer: Self-pay | Admitting: Family Medicine

## 2022-12-16 DIAGNOSIS — E890 Postprocedural hypothyroidism: Secondary | ICD-10-CM

## 2022-12-16 LAB — CMP14+EGFR
ALT: 16 [IU]/L (ref 0–32)
AST: 20 [IU]/L (ref 0–40)
Albumin: 3.9 g/dL (ref 3.9–4.9)
Alkaline Phosphatase: 71 [IU]/L (ref 44–121)
BUN/Creatinine Ratio: 19 (ref 12–28)
BUN: 15 mg/dL (ref 8–27)
Bilirubin Total: 0.6 mg/dL (ref 0.0–1.2)
CO2: 25 mmol/L (ref 20–29)
Calcium: 8.8 mg/dL (ref 8.7–10.3)
Chloride: 104 mmol/L (ref 96–106)
Creatinine, Ser: 0.77 mg/dL (ref 0.57–1.00)
Globulin, Total: 2.7 g/dL (ref 1.5–4.5)
Glucose: 105 mg/dL — ABNORMAL HIGH (ref 70–99)
Potassium: 4.7 mmol/L (ref 3.5–5.2)
Sodium: 142 mmol/L (ref 134–144)
Total Protein: 6.6 g/dL (ref 6.0–8.5)
eGFR: 87 mL/min/{1.73_m2} (ref >59)

## 2022-12-16 LAB — TSH: TSH: <0.005 u[IU]/mL — ABNORMAL LOW (ref 0.450–4.500)

## 2022-12-16 MED ORDER — LEVOTHYROXINE SODIUM 150 MCG PO TABS: 150.0000 ug | ORAL_TABLET | Freq: Every day | ORAL | 0 refills | Status: DC

## 2022-12-16 NOTE — Progress Notes (Signed)
Jashayla, according to your thyroid number you are overmedicated.  Verify the strength that you are taking and how much.  Your metabolic panel looks good.  Please let us know where you had your last eye exam or if you have an upcoming appointment.  You may have mentioned it while you were here, I am not sure.

## 2022-12-16 NOTE — Telephone Encounter (Signed)
Meds ordered this encounter  Medications   levothyroxine (SYNTHROID) 150 MCG tablet    Sig: Take 1 tablet (150 mcg total) by mouth daily before breakfast.    Dispense:  90 tablet    Refill:  0

## 2022-12-19 ENCOUNTER — Other Ambulatory Visit: Payer: Self-pay | Admitting: Family Medicine

## 2022-12-19 DIAGNOSIS — E890 Postprocedural hypothyroidism: Secondary | ICD-10-CM

## 2023-01-28 LAB — HM DIABETES EYE EXAM

## 2023-02-24 ENCOUNTER — Encounter: Payer: Self-pay | Admitting: Family Medicine

## 2023-02-25 ENCOUNTER — Telehealth: Payer: Self-pay | Admitting: Family Medicine

## 2023-02-25 MED ORDER — OSELTAMIVIR PHOSPHATE 75 MG PO CAPS: 75.0000 mg | ORAL_CAPSULE | Freq: Every day | ORAL | 0 refills | Status: DC

## 2023-02-25 NOTE — Telephone Encounter (Signed)
Copied from CRM [id number]. Topic: Clinical - Medical Advice >> Feb 25, 2023  9:42 AM Bobbye Morton wrote: Reason for CRM: Pt is going out of town with family and her daughter is sick with the flu, would like some medical advice on how to keep family healthy, would like call back [phone].  Pt's message to Dr. Linford Arnold:   "My daughter went to Urgent care today as she was not feeling well.  She was diagnosed with the Flu and was told we should get a prescription for Tamiflu prophylactically as we are all going to [facility] on Thursday and will be staying together.    Moses and I got the Flu vaccine.  Adam did not get it for this season.   Please advise."

## 2023-02-26 NOTE — Telephone Encounter (Signed)
See MyChart message

## 2023-04-09 ENCOUNTER — Other Ambulatory Visit: Payer: Self-pay | Admitting: Family Medicine

## 2023-04-09 DIAGNOSIS — E119 Type 2 diabetes mellitus without complications: Secondary | ICD-10-CM

## 2023-04-10 ENCOUNTER — Other Ambulatory Visit: Payer: Self-pay | Admitting: *Deleted

## 2023-04-15 ENCOUNTER — Ambulatory Visit: Payer: BC Managed Care – PPO | Admitting: Family Medicine

## 2023-04-15 ENCOUNTER — Encounter: Payer: Self-pay | Admitting: Family Medicine

## 2023-04-15 VITALS — BP 108/73 | HR 87 | Ht 64.0 in | Wt 193.2 lb

## 2023-04-15 DIAGNOSIS — E119 Type 2 diabetes mellitus without complications: Secondary | ICD-10-CM | POA: Diagnosis not present

## 2023-04-15 DIAGNOSIS — I10 Essential (primary) hypertension: Secondary | ICD-10-CM

## 2023-04-15 DIAGNOSIS — E890 Postprocedural hypothyroidism: Secondary | ICD-10-CM

## 2023-04-15 DIAGNOSIS — Z7985 Long-term (current) use of injectable non-insulin antidiabetic drugs: Secondary | ICD-10-CM

## 2023-04-15 DIAGNOSIS — Z7984 Long term (current) use of oral hypoglycemic drugs: Secondary | ICD-10-CM | POA: Diagnosis not present

## 2023-04-15 LAB — POCT GLYCOSYLATED HEMOGLOBIN (HGB A1C): Hemoglobin A1C: 6.5 % — AB (ref 4.0–5.6)

## 2023-04-15 NOTE — Assessment & Plan Note (Addendum)
 No concerns with her thyroid levels.  Taking her medication regularly without any problems.  Will recheck level today because we did make a slight adjustment to her regimen 4 months ago.

## 2023-04-15 NOTE — Assessment & Plan Note (Signed)
 Doing well on Ozempic 1 mg she is down to 6.5 on her A1c.  Weight is down to 193 pounds.  So she is lost another 3 pounds since I last saw her.  We discussed the possibility of going up to 2 mg and then being able to discontinue her metformin if we did so.  But for right now she wants to continue her current 1 mg dose along with the metformin.  Follow up in 4 months.  Continue lisinopril.

## 2023-04-15 NOTE — Progress Notes (Signed)
 Established Patient Office Visit  Subjective  Patient ID: Martha Woods, female    DOB: 02/17/1960  Age: 63 y.o. MRN: [id number]  Chief Complaint  Patient presents with   Diabetes   Hypothyroidism    HPI  Diabetes - no hypoglycemic events. No wounds or sores that are not healing well. No increased thirst or urination. Checking glucose at home. Taking medications as prescribed without any side effects.  Hypothyroidism - Taking medication regularly in the AM away from food and vitamins, etc. No recent change to skin, hair, or energy levels.  Did adjust her dosing 4 months ago in December as her TSH was overly suppressed.  Decreased her dose 250 mcg daily.  Reports her last eye exam was January 15.  She was actually able to come off of her glaucoma drops which is great they are still monitoring her cataracts.  Dealing with bilateral shoulder pain she saw the orthopedist recently and had an injection on the left side and that has been helpful they are scheduling an MRI on the right because they think she may have a rotator cuff injury.  She is still trying to enjoy her gardening.    ROS    Objective:     BP 108/73   Pulse 87   Ht 5\' 4"  (1.626 m)   Wt 193 lb 4 oz (87.7 kg)   LMP 12/26/2011   SpO2 97%   BMI 33.17 kg/m    Physical Exam Vitals and nursing note reviewed.  Constitutional:      Appearance: Normal appearance.  HENT:     Head: Normocephalic and atraumatic.  Eyes:     Conjunctiva/sclera: Conjunctivae normal.  Cardiovascular:     Rate and Rhythm: Normal rate and regular rhythm.  Pulmonary:     Effort: Pulmonary effort is normal.     Breath sounds: Normal breath sounds.  Skin:    General: Skin is warm and dry.  Neurological:     Mental Status: She is alert.  Psychiatric:        Mood and Affect: Mood normal.      Results for orders placed or performed in visit on 04/15/23  POCT HgB A1C  Result Value Ref Range   Hemoglobin A1C 6.5 (A) 4.0 -  5.6 %   HbA1c POC (<> result, manual entry)     HbA1c, POC (prediabetic range)     HbA1c, POC (controlled diabetic range)        The 10-year ASCVD risk score (Arnett DK, et al., 2019) is: 7.1%    Assessment & Plan:   Problem List Items Addressed This Visit       Cardiovascular and Mediastinum   HYPERTENSION, BENIGN SYSTEMIC   Pressure looks absolutely fantastic.  Continue current regimen.        Endocrine   Type 2 diabetes mellitus without complication, without long-term current use of insulin (HCC) - Primary   Doing well on Ozempic 1 mg she is down to 6.5 on her A1c.  Weight is down to 193 pounds.  So she is lost another 3 pounds since I last saw her.  We discussed the possibility of going up to 2 mg and then being able to discontinue her metformin if we did so.  But for right now she wants to continue her current 1 mg dose along with the metformin.  Follow up in 4 months.  Continue lisinopril.      Relevant Orders   POCT HgB A1C (Completed)  Post-surgical hypothyroidism   No concerns with her thyroid levels.  Taking her medication regularly without any problems.  Will recheck level today because we did make a slight adjustment to her regimen 4 months ago.      Relevant Orders   TSH    Return in about 4 months (around 08/15/2023) for Diabetes follow-up, Hypertension.    Nani Gasser, MD

## 2023-04-15 NOTE — Assessment & Plan Note (Signed)
 Pressure looks absolutely fantastic.  Continue current regimen.

## 2023-04-18 LAB — TSH: TSH: 0.021 u[IU]/mL — ABNORMAL LOW (ref 0.450–4.500)

## 2023-04-20 ENCOUNTER — Other Ambulatory Visit: Payer: Self-pay | Admitting: *Deleted

## 2023-04-20 ENCOUNTER — Encounter: Payer: Self-pay | Admitting: Family Medicine

## 2023-04-20 DIAGNOSIS — E890 Postprocedural hypothyroidism: Secondary | ICD-10-CM

## 2023-04-20 MED ORDER — LEVOTHYROXINE SODIUM 150 MCG PO TABS: ORAL_TABLET | ORAL | 1 refills | Status: DC

## 2023-04-20 NOTE — Progress Notes (Signed)
 Hi Classie, we will change to half a tab on Sundays and a whole tab the other 6 days a week.  Repeat this pattern each week.  And then recheck your level in 6 to 8 weeks.

## 2023-04-20 NOTE — Progress Notes (Signed)
 Hi Martha Woods, the TSH does look better than it did in December but you still look just a little overmedicated so we still need to make a small adjustment and then I think will be in a great place.  Just want to again verify your dosing before I make any changes please let us know.

## 2023-04-29 ENCOUNTER — Other Ambulatory Visit: Payer: Self-pay | Admitting: Family Medicine

## 2023-04-29 DIAGNOSIS — I10 Essential (primary) hypertension: Secondary | ICD-10-CM

## 2023-04-29 DIAGNOSIS — E7800 Pure hypercholesterolemia, unspecified: Secondary | ICD-10-CM

## 2023-04-29 DIAGNOSIS — E119 Type 2 diabetes mellitus without complications: Secondary | ICD-10-CM

## 2023-06-10 ENCOUNTER — Other Ambulatory Visit

## 2023-06-10 ENCOUNTER — Other Ambulatory Visit: Payer: Self-pay

## 2023-06-10 DIAGNOSIS — E890 Postprocedural hypothyroidism: Secondary | ICD-10-CM

## 2023-06-11 ENCOUNTER — Ambulatory Visit: Payer: Self-pay | Admitting: Family Medicine

## 2023-06-11 DIAGNOSIS — E890 Postprocedural hypothyroidism: Secondary | ICD-10-CM

## 2023-06-11 LAB — TSH: TSH: 0.012 u[IU]/mL — ABNORMAL LOW (ref 0.450–4.500)

## 2023-06-11 NOTE — Progress Notes (Signed)
 Hi Martha Woods, it still looks like you are overmedicated in regards to your thyroid .  I just want to confirm if you are still taking a half of a tab on Sunday and a whole tab Monday through Saturday.  As I will need to make an adjustment to your regimen.

## 2023-06-17 NOTE — Progress Notes (Signed)
 OK, change to half a tab twice a week and whole tab 5 days and recheck in 6 weeks. Or if she is almost out I can change her dose.  To make a similar reduction in dose.  Right now she is overmedicated.

## 2023-06-19 MED ORDER — LEVOTHYROXINE SODIUM 125 MCG PO TABS: 125.0000 ug | ORAL_TABLET | Freq: Every day | ORAL | 0 refills | Status: DC

## 2023-06-19 NOTE — Progress Notes (Signed)
 Orders Placed This Encounter     levothyroxine  (SYNTHROID ) 125 MCG tablet         Sig: Take 1 tablet (125 mcg total) by mouth daily before breakfast.         Dispense:  90 tablet         Refill:  0

## 2023-08-19 ENCOUNTER — Ambulatory Visit: Admitting: Family Medicine

## 2023-08-19 ENCOUNTER — Encounter: Payer: Self-pay | Admitting: Family Medicine

## 2023-08-19 VITALS — BP 126/73 | HR 80 | Ht 64.0 in | Wt 202.0 lb

## 2023-08-19 DIAGNOSIS — Z7985 Long-term (current) use of injectable non-insulin antidiabetic drugs: Secondary | ICD-10-CM

## 2023-08-19 DIAGNOSIS — Z7984 Long term (current) use of oral hypoglycemic drugs: Secondary | ICD-10-CM

## 2023-08-19 DIAGNOSIS — E890 Postprocedural hypothyroidism: Secondary | ICD-10-CM

## 2023-08-19 DIAGNOSIS — Z23 Encounter for immunization: Secondary | ICD-10-CM | POA: Diagnosis not present

## 2023-08-19 DIAGNOSIS — I10 Essential (primary) hypertension: Secondary | ICD-10-CM | POA: Diagnosis not present

## 2023-08-19 DIAGNOSIS — R202 Paresthesia of skin: Secondary | ICD-10-CM | POA: Insufficient documentation

## 2023-08-19 DIAGNOSIS — E119 Type 2 diabetes mellitus without complications: Secondary | ICD-10-CM

## 2023-08-19 DIAGNOSIS — E7800 Pure hypercholesterolemia, unspecified: Secondary | ICD-10-CM | POA: Diagnosis not present

## 2023-08-19 LAB — POCT GLYCOSYLATED HEMOGLOBIN (HGB A1C): Hemoglobin A1C: 6.3 % — AB (ref 4.0–5.6)

## 2023-08-19 NOTE — Assessment & Plan Note (Signed)
 Martha Woods looks great today at 6.3.  She did gain a little bit of weight while she was in [place] for 3 weeks.  She plans on getting back on track now that she is back home.  Will continue with current regimen she said she may try to go up to 1-1/2 on the Ozempic  she still has some 0.5 doses at home.  Continue with metformin  as well.

## 2023-08-19 NOTE — Assessment & Plan Note (Signed)
 Due for updated lipid panel

## 2023-08-19 NOTE — Assessment & Plan Note (Signed)
 Clear etiology just encouraged her to continue to wear supportive shoe wear.  Will check B12, B1 and B6 levels to make sure she is not deficient she has been on metformin  for quite some time.  If not improving over the next month then consider referral for nerve conduction studies.  Her diabetes is well-controlled so it would be unusual for this to have started a month ago.

## 2023-08-19 NOTE — Assessment & Plan Note (Signed)
 Pressure looks fantastic today.  Continue current regimen.

## 2023-08-19 NOTE — Assessment & Plan Note (Signed)
 Levothyroxine  was decreased to 125 mcg last time.  Due to recheck TSH.

## 2023-08-19 NOTE — Progress Notes (Signed)
 Established Patient Office Visit  Subjective  Patient ID: Martha Woods, female    DOB: [birth year]  Age: 63 y.o. MRN: [id number]  Chief Complaint  Patient presents with   Diabetes    Burning and tingling in feet for past month. Requested recent DM eye exam   Hypertension    HPI  Hypertension- Pt denies chest pain, SOB, dizziness, or heart palpitations.  Taking meds as directed w/o problems.  Denies medication side effects.    Diabetes - no hypoglycemic events. No wounds or sores that are not healing well. No increased thirst or urination. Checking glucose at home. Taking medications as prescribed without any side effects.  Currently on Ozempic  1 mg and doing really well.  C/o  of burning and tingling in her feet for the last month.  She says it started while she was in [place] but it has not really gone away it seems to be worse at night especially the burning sensation.  She does not wear her orthotics as much as she used to since she retired.  But she does normally wear some shoes around the house and does not go barefoot all that often.    ROS    Objective:     BP 126/73   Pulse 80   Ht 5' 4 (1.626 m)   Wt 202 lb (91.6 kg)   LMP 12/26/2011   SpO2 100%   BMI 34.67 kg/m    Physical Exam Vitals and nursing note reviewed.  Constitutional:      Appearance: Normal appearance.  HENT:     Head: Normocephalic and atraumatic.  Eyes:     Conjunctiva/sclera: Conjunctivae normal.  Cardiovascular:     Rate and Rhythm: Normal rate and regular rhythm.  Pulmonary:     Effort: Pulmonary effort is normal.     Breath sounds: Normal breath sounds.  Skin:    General: Skin is warm and dry.  Neurological:     Mental Status: She is alert.  Psychiatric:        Mood and Affect: Mood normal.      Results for orders placed or performed in visit on 08/19/23  POCT HgB A1C  Result Value Ref Range   Hemoglobin A1C 6.3 (A) 4.0 - 5.6 %   HbA1c POC (<> result, manual  entry)     HbA1c, POC (prediabetic range)     HbA1c, POC (controlled diabetic range)        The 10-year ASCVD risk score (Arnett DK, et al., 2019) is: 9.6%    Assessment & Plan:   Problem List Items Addressed This Visit       Cardiovascular and Mediastinum   HYPERTENSION, BENIGN SYSTEMIC   Pressure looks fantastic today.  Continue current regimen.      Relevant Orders   CMP14+EGFR   Lipid panel   TSH     Endocrine   Type 2 diabetes mellitus without complication, without long-term current use of insulin (HCC) - Primary   1C looks great today at 6.3.  She did gain a little bit of weight while she was in [place] for 3 weeks.  She plans on getting back on track now that she is back home.  Will continue with current regimen she said she may try to go up to 1-1/2 on the Ozempic  she still has some 0.5 doses at home.  Continue with metformin  as well.      Relevant Orders   POCT HgB A1C (Completed)   Urine Microalbumin  w/creat. ratio   CMP14+EGFR   Lipid panel   TSH   Post-surgical hypothyroidism   Levothyroxine  was decreased to 125 mcg last time.  Due to recheck TSH.      Relevant Orders   CMP14+EGFR   Lipid panel   TSH     Other   Paresthesia of both feet   Clear etiology just encouraged her to continue to wear supportive shoe wear.  Will check B12, B1 and B6 levels to make sure she is not deficient she has been on metformin  for quite some time.  If not improving over the next month then consider referral for nerve conduction studies.  Her diabetes is well-controlled so it would be unusual for this to have started a month ago.      Relevant Orders   B12   Vitamin B1   Vitamin B6   Hyperlipidemia   Due for updated lipid panel.      Relevant Orders   Lipid panel   Other Visit Diagnoses       Encounter for immunization       Relevant Orders   Pneumococcal conjugate vaccine 20-valent (Completed)       No follow-ups on file.    Dorothyann Byars, MD

## 2023-08-20 LAB — SPECIMEN STATUS REPORT

## 2023-08-20 LAB — MICROALBUMIN / CREATININE URINE RATIO
Creatinine, Urine: 106.9 mg/dL
Microalb/Creat Ratio: <3 mg/g{creat} (ref 0–29)
Microalbumin, Urine: <3 ug/mL

## 2023-08-21 ENCOUNTER — Encounter: Payer: Self-pay | Admitting: Family Medicine

## 2023-08-21 ENCOUNTER — Other Ambulatory Visit: Payer: Self-pay

## 2023-08-21 ENCOUNTER — Ambulatory Visit: Payer: Self-pay | Admitting: Family Medicine

## 2023-08-21 DIAGNOSIS — E890 Postprocedural hypothyroidism: Secondary | ICD-10-CM

## 2023-08-21 MED ORDER — LEVOTHYROXINE SODIUM 125 MCG PO TABS: 125.0000 ug | ORAL_TABLET | Freq: Every day | ORAL | 0 refills | Status: DC

## 2023-08-21 NOTE — Progress Notes (Signed)
 Hi Martha Woods, thyroid  level looks better it was 0.0 last time this time at 0.4.  This is much better.  It is almost right into the normal range.  So I think what we will do is stay at this dose for another 3 months and then we will check it again in November. Metabolic panel looks great.  Cholesterol is up a little bit compared to last year.  But I know you had been eating differently so that probably explains it.  No excess protein in the urine which is great.  B12 looks good.  B1 and B6 are still pending.

## 2023-08-22 LAB — LIPID PANEL
Chol/HDL Ratio: 2.8 ratio (ref 0.0–4.4)
Cholesterol, Total: 178 mg/dL (ref 100–199)
HDL: 64 mg/dL (ref >39)
LDL Chol Calc (NIH): 99 mg/dL (ref 0–99)
Triglycerides: 83 mg/dL (ref 0–149)
VLDL Cholesterol Cal: 15 mg/dL (ref 5–40)

## 2023-08-22 LAB — VITAMIN B6: Vitamin B6: 11.8 ug/L (ref 3.4–65.2)

## 2023-08-22 LAB — CMP14+EGFR
ALT: 12 IU/L (ref 0–32)
AST: 19 IU/L (ref 0–40)
Albumin: 4.3 g/dL (ref 3.9–4.9)
Alkaline Phosphatase: 83 IU/L (ref 44–121)
BUN/Creatinine Ratio: 19 (ref 12–28)
BUN: 17 mg/dL (ref 8–27)
Bilirubin Total: 0.5 mg/dL (ref 0.0–1.2)
CO2: 24 mmol/L (ref 20–29)
Calcium: 9.4 mg/dL (ref 8.7–10.3)
Chloride: 100 mmol/L (ref 96–106)
Creatinine, Ser: 0.89 mg/dL (ref 0.57–1.00)
Globulin, Total: 3 g/dL (ref 1.5–4.5)
Glucose: 78 mg/dL (ref 70–99)
Potassium: 4.9 mmol/L (ref 3.5–5.2)
Sodium: 140 mmol/L (ref 134–144)
Total Protein: 7.3 g/dL (ref 6.0–8.5)
eGFR: 72 mL/min/1.73 (ref >59)

## 2023-08-22 LAB — TSH: TSH: 0.426 u[IU]/mL — ABNORMAL LOW (ref 0.450–4.500)

## 2023-08-22 LAB — VITAMIN B12: Vitamin B-12: 443 pg/mL (ref 232–1245)

## 2023-08-22 LAB — VITAMIN B1: Thiamine: 90.2 nmol/L (ref 66.5–200.0)

## 2023-08-25 NOTE — Progress Notes (Signed)
 Hi Dorena, B1 and B6 look great.

## 2023-08-27 ENCOUNTER — Other Ambulatory Visit: Payer: Self-pay | Admitting: Family Medicine

## 2023-08-27 DIAGNOSIS — E119 Type 2 diabetes mellitus without complications: Secondary | ICD-10-CM

## 2023-09-02 ENCOUNTER — Telehealth: Payer: Self-pay

## 2023-09-02 ENCOUNTER — Other Ambulatory Visit (HOSPITAL_COMMUNITY): Payer: Self-pay

## 2023-09-02 NOTE — Telephone Encounter (Signed)
 PA requested was closed

## 2023-09-02 NOTE — Telephone Encounter (Signed)
 Pharmacy Patient Advocate Encounter   Received notification from CoverMyMeds that prior authorization for Ozempic  4mg /65ml is required/requested.   Insurance verification completed.   The patient is insured through [facility] .   Per test claim: PA required; PA submitted to above mentioned insurance via Latent Key/confirmation #/EOC [id number] Status is pending

## 2023-09-07 ENCOUNTER — Telehealth: Payer: Self-pay

## 2023-09-07 NOTE — Telephone Encounter (Signed)
 Copied from CRM #[id number]. Topic: Clinical - Prescription Issue >> Sep 07, 2023 11:15 AM Martha Woods ORN wrote: Reason for CRM: Patient called to leave a message for Bascom, Dr. Vernida assistant. Patient states she received a message from [facility] that her OZEMPIC , 1 MG/DOSE, 4 MG/3ML SOPN order was cancelled by provider and patient states she's unsure as to why.

## 2023-09-15 ENCOUNTER — Encounter: Payer: Self-pay | Admitting: Sports Medicine

## 2023-10-07 ENCOUNTER — Other Ambulatory Visit (HOSPITAL_COMMUNITY): Payer: Self-pay

## 2023-10-08 ENCOUNTER — Other Ambulatory Visit (HOSPITAL_COMMUNITY): Payer: Self-pay

## 2023-10-09 ENCOUNTER — Other Ambulatory Visit (HOSPITAL_COMMUNITY): Payer: Self-pay

## 2023-10-19 ENCOUNTER — Telehealth: Payer: Self-pay

## 2023-10-19 NOTE — Telephone Encounter (Signed)
 We have a prior Auth team, so Bascom would not know anything about the prior Auth.  We may need to contact them.  Might be helpful to start by contacting [facility] first and seeing what they need from us  and then going from there.

## 2023-10-19 NOTE — Telephone Encounter (Signed)
 Please Advise      Copied from CRM [id number]. Topic: Clinical - Prescription Issue >> Oct 19, 2023 11:23 AM Martha Woods wrote: Reason for CRM: Patient is calling back to speak with Bascom regarding her pre authorization for her medication: OZEMPIC , 1 MG/DOSE, 4 MG/3ML SOPN  Patient states that [facility] and she still does not have her medication and would like Woods call back from [place].

## 2023-10-19 NOTE — Telephone Encounter (Signed)
 Please Advise

## 2023-10-19 NOTE — Telephone Encounter (Signed)
 Copied from CRM [id number]. Topic: Clinical - Prescription Issue >> Oct 19, 2023 11:23 AM Rosaria A wrote: Reason for CRM: Patient is calling back to speak with Bascom regarding her pre authorization for her medication: OZEMPIC , 1 MG/DOSE, 4 MG/3ML SOPN  Patient states that [facility] and she still does not have her medication and would like a call back from [place].

## 2023-10-20 ENCOUNTER — Other Ambulatory Visit (HOSPITAL_COMMUNITY): Payer: Self-pay

## 2023-10-20 NOTE — Telephone Encounter (Signed)
 Contacted [facility] directly regarding the status for the Ozempic  rx. Per Tamera - a prior authorization is required on the Ozempic . The following request has been forwarded to the Rx [employer].

## 2023-10-20 NOTE — Telephone Encounter (Signed)
 Contacted [facility] regarding the status for the Ozempic  rx. Per Tamera - a prior authorization is required on the Ozempic . Thanks in advance.

## 2023-10-21 ENCOUNTER — Telehealth: Payer: Self-pay

## 2023-10-21 ENCOUNTER — Other Ambulatory Visit (HOSPITAL_COMMUNITY): Payer: Self-pay

## 2023-10-21 NOTE — Telephone Encounter (Signed)
 Pharmacy Patient Advocate Encounter   Received notification from Pt Calls Messages that prior authorization for Ozempic  4mg /51ml is required/requested.   Insurance verification completed.   The patient is insured through [facility].   Per test claim: PA required; PA submitted to above mentioned insurance via Latent Key/confirmation #/EOC [id number] Status is pending

## 2023-10-21 NOTE — Telephone Encounter (Signed)
 Pharmacy Patient Advocate Encounter  Received notification from [facility] that Prior Authorization for Ozempic  4mg /69ml has been APPROVED from 10/21/23 to 10/21/26. Ran test claim, Copay is $74.99 for 3 months. This test claim was processed through [facility]- copay amounts may vary at other pharmacies due to pharmacy/plan contracts, or as the patient moves through the different stages of their insurance plan.   PA #/Case ID/Reference #: [id number]  *If patient visit [email or web address] .com, there is a savings card that will bring her copay down to $24.99 for 3 months supply*

## 2023-11-04 ENCOUNTER — Encounter: Payer: Self-pay | Admitting: Family Medicine

## 2023-11-10 ENCOUNTER — Other Ambulatory Visit: Payer: Self-pay | Admitting: *Deleted

## 2023-11-10 ENCOUNTER — Encounter: Payer: Self-pay | Admitting: Family Medicine

## 2023-11-10 DIAGNOSIS — E119 Type 2 diabetes mellitus without complications: Secondary | ICD-10-CM

## 2023-11-10 MED ORDER — OZEMPIC (1 MG/DOSE) 4 MG/3ML ~~LOC~~ SOPN: 1.0000 mg | PEN_INJECTOR | SUBCUTANEOUS | 1 refills | Status: AC

## 2023-11-25 ENCOUNTER — Ambulatory Visit: Admitting: Family Medicine

## 2023-11-25 ENCOUNTER — Encounter: Payer: Self-pay | Admitting: Family Medicine

## 2023-11-25 VITALS — BP 128/69 | HR 89 | Ht 64.0 in | Wt 196.0 lb

## 2023-11-25 DIAGNOSIS — Z7985 Long-term (current) use of injectable non-insulin antidiabetic drugs: Secondary | ICD-10-CM

## 2023-11-25 DIAGNOSIS — E890 Postprocedural hypothyroidism: Secondary | ICD-10-CM | POA: Diagnosis not present

## 2023-11-25 DIAGNOSIS — E119 Type 2 diabetes mellitus without complications: Secondary | ICD-10-CM

## 2023-11-25 DIAGNOSIS — I10 Essential (primary) hypertension: Secondary | ICD-10-CM | POA: Diagnosis not present

## 2023-11-25 DIAGNOSIS — Z7984 Long term (current) use of oral hypoglycemic drugs: Secondary | ICD-10-CM | POA: Diagnosis not present

## 2023-11-25 LAB — POCT GLYCOSYLATED HEMOGLOBIN (HGB A1C): Hemoglobin A1C: 6.4 % — AB (ref 4.0–5.6)

## 2023-11-25 NOTE — Assessment & Plan Note (Signed)
 BP looks good!! Labs are UTD

## 2023-11-25 NOTE — Assessment & Plan Note (Addendum)
 Due to rechecked TSH.   hx of thyroid  cancer Thyroid  levels previously close to target. On 90-day supply of thyroid  medication. No symptoms reported. - Checked thyroid  levels today. - Continue current thyroid  medication regimen. - Re-evaluate thyroid  levels in November.

## 2023-11-25 NOTE — Assessment & Plan Note (Signed)
 Type 2 diabetes mellitus Well-controlled with A1c of 6.4. Blood glucose stable, consistently below 120 mg/dL. On Ozempic  1 mg weekly. Transition to [facility] may require new prior authorization. - Continue Ozempic  1 mg weekly. - Ensure prior authorization for Ozempic  with [employer].

## 2023-11-25 NOTE — Progress Notes (Signed)
 Established Patient Office Visit  Patient ID: Martha Woods, female    DOB: 18-Apr-1960  Age: 63 y.o. MRN: [id number] PCP: Alvan Dorothyann BIRCH, MD  Chief Complaint  Patient presents with   Diabetes   Hypertension    Subjective:     HPI  Discussed the use of AI scribe software for clinical note transcription with the patient, who gave verbal consent to proceed.  History of Present Illness Martha Woods is a 63 year old female with diabetes and coronary artery disease who presents for follow-up and pre-surgical evaluation.   Her husband, who is a pt here is scheduled for cardiac surgery evaluation on Friday due to 95% coronary artery blockage with calcification. Previous stent placement attempt was unsuccessful, requiring overnight hospitalization  Glycemic control - Diabetes management is stable - Hemoglobin A1c is 6.4, with recent values ranging from 6.3 to 6.5 - Currently taking Ozempic  1 mg - Blood glucose levels consistently below 120 mg/dL  Thyroid  function - Recently filled a 90-day supply of thyroid  medication - No symptoms of thyroid  imbalance  Preventive care and screening - Last eye examination performed in January; awaiting proper filing of the report - Received influenza vaccination at last visit     ROS    Objective:     BP 128/69   Pulse 89   Ht 5' 4 (1.626 m)   Wt 196 lb (88.9 kg)   LMP 12/26/2011   SpO2 100%   BMI 33.64 kg/m    Physical Exam Vitals and nursing note reviewed.  Constitutional:      Appearance: Normal appearance.  HENT:     Head: Normocephalic and atraumatic.  Eyes:     Conjunctiva/sclera: Conjunctivae normal.  Cardiovascular:     Rate and Rhythm: Normal rate and regular rhythm.  Pulmonary:     Effort: Pulmonary effort is normal.     Breath sounds: Normal breath sounds.  Skin:    General: Skin is warm and dry.  Neurological:     Mental Status: She is alert.  Psychiatric:        Mood and  Affect: Mood normal.      Results for orders placed or performed in visit on 11/25/23  POCT HgB A1C  Result Value Ref Range   Hemoglobin A1C 6.4 (A) 4.0 - 5.6 %   HbA1c POC (<> result, manual entry)     HbA1c, POC (prediabetic range)     HbA1c, POC (controlled diabetic range)        The 10-year ASCVD risk score (Arnett DK, et al., 2019) is: 11%    Assessment & Plan:   Problem List Items Addressed This Visit       Cardiovascular and Mediastinum   HYPERTENSION, BENIGN SYSTEMIC   BP looks good!! Labs are UTD        Endocrine   Type 2 diabetes mellitus without complication, without long-term current use of insulin (HCC) - Primary   Type 2 diabetes mellitus Well-controlled with A1c of 6.4. Blood glucose stable, consistently below 120 mg/dL. On Ozempic  1 mg weekly. Transition to [facility] may require new prior authorization. - Continue Ozempic  1 mg weekly. - Ensure prior authorization for Ozempic  with [employer].      Relevant Orders   POCT HgB A1C (Completed)   TSH   Post-surgical hypothyroidism   Due to rechecked TSH.   hx of thyroid  cancer Thyroid  levels previously close to target. On 90-day supply of thyroid  medication. No symptoms reported. - Checked thyroid  levels today. -  Continue current thyroid  medication regimen. - Re-evaluate thyroid  levels in November.      Relevant Orders   POCT HgB A1C (Completed)   TSH    Assessment and Plan Assessment & Plan   General Health Maintenance Received flu shot at last visit. Last eye exam in January, report missing from records. - Ensure flu shot is up to date.   Return in about 6 months (around 05/24/2024) for Diabetes follow-up.    Dorothyann Byars, MD [facility] at [facility]

## 2023-11-26 ENCOUNTER — Ambulatory Visit: Payer: Self-pay | Admitting: Family Medicine

## 2023-11-26 LAB — TSH: TSH: 0.561 u[IU]/mL (ref 0.450–4.500)

## 2023-11-26 NOTE — Progress Notes (Signed)
 Hi Mekaela, TSH is at 0.5.  Technically into the normal range, and looking better but still on that lower end.  So I the really like for her to take a half a tab on Sundays and a whole tab the other 6 days of the week.  Repeat this pattern and then we will plan to recheck level again in 3 months.SABRA

## 2023-11-30 ENCOUNTER — Encounter: Payer: Self-pay | Admitting: Family Medicine

## 2023-11-30 DIAGNOSIS — E890 Postprocedural hypothyroidism: Secondary | ICD-10-CM

## 2023-11-30 MED ORDER — LEVOTHYROXINE SODIUM 125 MCG PO TABS: 125.0000 ug | ORAL_TABLET | Freq: Every day | ORAL | 0 refills | Status: AC

## 2023-12-03 ENCOUNTER — Other Ambulatory Visit: Payer: Self-pay | Admitting: Family Medicine

## 2023-12-03 DIAGNOSIS — Z1231 Encounter for screening mammogram for malignant neoplasm of breast: Secondary | ICD-10-CM

## 2023-12-15 ENCOUNTER — Other Ambulatory Visit: Payer: Self-pay | Admitting: Family Medicine

## 2023-12-15 DIAGNOSIS — E119 Type 2 diabetes mellitus without complications: Secondary | ICD-10-CM

## 2023-12-15 DIAGNOSIS — I10 Essential (primary) hypertension: Secondary | ICD-10-CM

## 2023-12-16 ENCOUNTER — Other Ambulatory Visit: Payer: Self-pay | Admitting: Family Medicine

## 2023-12-16 DIAGNOSIS — E7800 Pure hypercholesterolemia, unspecified: Secondary | ICD-10-CM

## 2023-12-17 ENCOUNTER — Ambulatory Visit

## 2023-12-17 DIAGNOSIS — Z1231 Encounter for screening mammogram for malignant neoplasm of breast: Secondary | ICD-10-CM

## 2023-12-25 ENCOUNTER — Ambulatory Visit: Payer: Self-pay | Admitting: Family Medicine

## 2023-12-25 NOTE — Progress Notes (Signed)
 Please call patient. Normal mammogram.  Repeat in 1 year.

## 2024-05-24 ENCOUNTER — Ambulatory Visit: Admitting: Family Medicine
